# Patient Record
Sex: Female | Born: 1935 | Race: White | Hispanic: No | Marital: Married | Smoking: Never smoker
Health system: Southern US, Community
[De-identification: ages and names within clinical notes are randomized; demographics above are authoritative.]

## PROBLEM LIST (undated history)

## (undated) DIAGNOSIS — K2210 Ulcer of esophagus without bleeding: Secondary | ICD-10-CM

## (undated) DIAGNOSIS — M1990 Unspecified osteoarthritis, unspecified site: Secondary | ICD-10-CM

## (undated) DIAGNOSIS — I779 Disorder of arteries and arterioles, unspecified: Secondary | ICD-10-CM

## (undated) DIAGNOSIS — I340 Nonrheumatic mitral (valve) insufficiency: Secondary | ICD-10-CM

## (undated) DIAGNOSIS — T148XXA Other injury of unspecified body region, initial encounter: Secondary | ICD-10-CM

## (undated) DIAGNOSIS — I10 Essential (primary) hypertension: Secondary | ICD-10-CM

## (undated) DIAGNOSIS — E785 Hyperlipidemia, unspecified: Secondary | ICD-10-CM

## (undated) DIAGNOSIS — G4730 Sleep apnea, unspecified: Secondary | ICD-10-CM

## (undated) DIAGNOSIS — H547 Unspecified visual loss: Secondary | ICD-10-CM

## (undated) DIAGNOSIS — I358 Other nonrheumatic aortic valve disorders: Secondary | ICD-10-CM

## (undated) DIAGNOSIS — K589 Irritable bowel syndrome without diarrhea: Secondary | ICD-10-CM

## (undated) DIAGNOSIS — R110 Nausea: Secondary | ICD-10-CM

## (undated) DIAGNOSIS — F418 Other specified anxiety disorders: Secondary | ICD-10-CM

## (undated) DIAGNOSIS — IMO0002 Reserved for concepts with insufficient information to code with codable children: Secondary | ICD-10-CM

## (undated) DIAGNOSIS — C4491 Basal cell carcinoma of skin, unspecified: Secondary | ICD-10-CM

## (undated) DIAGNOSIS — I4892 Unspecified atrial flutter: Secondary | ICD-10-CM

## (undated) DIAGNOSIS — K219 Gastro-esophageal reflux disease without esophagitis: Secondary | ICD-10-CM

## (undated) DIAGNOSIS — N3000 Acute cystitis without hematuria: Secondary | ICD-10-CM

## (undated) DIAGNOSIS — K5730 Diverticulosis of large intestine without perforation or abscess without bleeding: Secondary | ICD-10-CM

## (undated) DIAGNOSIS — IMO0001 Reserved for inherently not codable concepts without codable children: Secondary | ICD-10-CM

## (undated) DIAGNOSIS — R9430 Abnormal result of cardiovascular function study, unspecified: Secondary | ICD-10-CM

## (undated) DIAGNOSIS — E7800 Pure hypercholesterolemia, unspecified: Secondary | ICD-10-CM

## (undated) DIAGNOSIS — D71 Functional disorders of polymorphonuclear neutrophils: Secondary | ICD-10-CM

## (undated) DIAGNOSIS — R5383 Other fatigue: Secondary | ICD-10-CM

## (undated) DIAGNOSIS — R609 Edema, unspecified: Secondary | ICD-10-CM

## (undated) DIAGNOSIS — Z7901 Long term (current) use of anticoagulants: Secondary | ICD-10-CM

## (undated) DIAGNOSIS — R140 Abdominal distension (gaseous): Secondary | ICD-10-CM

## (undated) DIAGNOSIS — E669 Obesity, unspecified: Secondary | ICD-10-CM

## (undated) DIAGNOSIS — I739 Peripheral vascular disease, unspecified: Secondary | ICD-10-CM

## (undated) DIAGNOSIS — K921 Melena: Secondary | ICD-10-CM

## (undated) DIAGNOSIS — M549 Dorsalgia, unspecified: Secondary | ICD-10-CM

## (undated) HISTORY — PX: DILATION AND CURETTAGE OF UTERUS: SHX78

## (undated) HISTORY — PX: OTHER SURGICAL HISTORY: SHX169

## (undated) HISTORY — DX: Other specified anxiety disorders: F41.8

## (undated) HISTORY — DX: Pure hypercholesterolemia, unspecified: E78.00

## (undated) HISTORY — DX: Reserved for inherently not codable concepts without codable children: IMO0001

## (undated) HISTORY — DX: Melena: K92.1

## (undated) HISTORY — DX: Unspecified atrial flutter: I48.92

## (undated) HISTORY — DX: Other injury of unspecified body region, initial encounter: T14.8XXA

## (undated) HISTORY — DX: Hyperlipidemia, unspecified: E78.5

## (undated) HISTORY — DX: Basal cell carcinoma of skin, unspecified: C44.91

## (undated) HISTORY — DX: Nonrheumatic mitral (valve) insufficiency: I34.0

## (undated) HISTORY — DX: Diverticulosis of large intestine without perforation or abscess without bleeding: K57.30

## (undated) HISTORY — DX: Essential (primary) hypertension: I10

## (undated) HISTORY — DX: Irritable bowel syndrome, unspecified: K58.9

## (undated) HISTORY — DX: Functional disorders of polymorphonuclear neutrophils: D71

## (undated) HISTORY — DX: Other nonrheumatic aortic valve disorders: I35.8

## (undated) HISTORY — DX: Unspecified osteoarthritis, unspecified site: M19.90

## (undated) HISTORY — PX: REPLACEMENT TOTAL KNEE BILATERAL: SUR1225

## (undated) HISTORY — DX: Nausea: R11.0

## (undated) HISTORY — DX: Abdominal distension (gaseous): R14.0

## (undated) HISTORY — DX: Sleep apnea, unspecified: G47.30

## (undated) HISTORY — DX: Edema, unspecified: R60.9

## (undated) HISTORY — DX: Unspecified visual loss: H54.7

## (undated) HISTORY — DX: Other fatigue: R53.83

## (undated) HISTORY — DX: Gastro-esophageal reflux disease without esophagitis: K21.9

## (undated) HISTORY — PX: CATARACT EXTRACTION: SUR2

## (undated) HISTORY — DX: Disorder of arteries and arterioles, unspecified: I77.9

## (undated) HISTORY — DX: Abnormal result of cardiovascular function study, unspecified: R94.30

## (undated) HISTORY — DX: Acute cystitis without hematuria: N30.00

## (undated) HISTORY — DX: Peripheral vascular disease, unspecified: I73.9

## (undated) HISTORY — DX: Dorsalgia, unspecified: M54.9

## (undated) HISTORY — DX: Long term (current) use of anticoagulants: Z79.01

## (undated) HISTORY — DX: Obesity, unspecified: E66.9

## (undated) HISTORY — DX: Ulcer of esophagus without bleeding: K22.10

## (undated) HISTORY — DX: Reserved for concepts with insufficient information to code with codable children: IMO0002

---
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
## 1999-01-02 HISTORY — PX: ABDOMINAL HYSTERECTOMY: SHX81

## 1999-01-02 HISTORY — PX: CATARACT EXTRACTION: SUR2

## 2001-11-24 ENCOUNTER — Ambulatory Visit (HOSPITAL_COMMUNITY): Admission: RE | Admit: 2001-11-24 | Discharge: 2001-11-24 | Payer: Self-pay | Admitting: Internal Medicine

## 2001-11-24 ENCOUNTER — Encounter: Payer: Self-pay | Admitting: Internal Medicine

## 2002-06-02 ENCOUNTER — Encounter: Payer: Self-pay | Admitting: Emergency Medicine

## 2002-06-02 ENCOUNTER — Encounter: Payer: Self-pay | Admitting: Internal Medicine

## 2002-06-02 ENCOUNTER — Observation Stay (HOSPITAL_COMMUNITY): Admission: EM | Admit: 2002-06-02 | Discharge: 2002-06-04 | Payer: Self-pay | Admitting: Emergency Medicine

## 2002-11-27 ENCOUNTER — Ambulatory Visit (HOSPITAL_COMMUNITY): Admission: RE | Admit: 2002-11-27 | Discharge: 2002-11-27 | Payer: Self-pay | Admitting: Family Medicine

## 2002-12-23 ENCOUNTER — Encounter: Admission: RE | Admit: 2002-12-23 | Discharge: 2002-12-23 | Payer: Self-pay | Admitting: Sports Medicine

## 2003-01-06 ENCOUNTER — Encounter: Admission: RE | Admit: 2003-01-06 | Discharge: 2003-01-06 | Payer: Self-pay | Admitting: Sports Medicine

## 2003-01-21 ENCOUNTER — Encounter: Admission: RE | Admit: 2003-01-21 | Discharge: 2003-01-21 | Payer: Self-pay | Admitting: Sports Medicine

## 2003-04-28 ENCOUNTER — Ambulatory Visit (HOSPITAL_COMMUNITY): Admission: RE | Admit: 2003-04-28 | Discharge: 2003-04-28 | Payer: Self-pay | Admitting: Family Medicine

## 2003-06-21 ENCOUNTER — Encounter (HOSPITAL_BASED_OUTPATIENT_CLINIC_OR_DEPARTMENT_OTHER): Admission: RE | Admit: 2003-06-21 | Discharge: 2003-07-02 | Payer: Self-pay | Admitting: Internal Medicine

## 2003-07-13 ENCOUNTER — Emergency Department (HOSPITAL_COMMUNITY): Admission: EM | Admit: 2003-07-13 | Discharge: 2003-07-13 | Payer: Self-pay | Admitting: Emergency Medicine

## 2003-09-20 ENCOUNTER — Ambulatory Visit (HOSPITAL_COMMUNITY): Admission: RE | Admit: 2003-09-20 | Discharge: 2003-09-20 | Payer: Self-pay | Admitting: Ophthalmology

## 2003-12-09 ENCOUNTER — Ambulatory Visit: Payer: Self-pay | Admitting: Family Medicine

## 2003-12-14 ENCOUNTER — Ambulatory Visit (HOSPITAL_COMMUNITY): Admission: RE | Admit: 2003-12-14 | Discharge: 2003-12-14 | Payer: Self-pay | Admitting: Family Medicine

## 2004-01-26 ENCOUNTER — Ambulatory Visit: Payer: Self-pay | Admitting: Family Medicine

## 2004-02-14 ENCOUNTER — Ambulatory Visit: Payer: Self-pay | Admitting: Cardiology

## 2004-02-22 ENCOUNTER — Ambulatory Visit (HOSPITAL_COMMUNITY): Admission: RE | Admit: 2004-02-22 | Discharge: 2004-02-22 | Payer: Self-pay | Admitting: Cardiology

## 2004-02-22 ENCOUNTER — Ambulatory Visit: Payer: Self-pay | Admitting: *Deleted

## 2004-02-29 ENCOUNTER — Ambulatory Visit: Payer: Self-pay | Admitting: Family Medicine

## 2004-03-08 ENCOUNTER — Ambulatory Visit: Payer: Self-pay | Admitting: Cardiology

## 2004-03-17 ENCOUNTER — Ambulatory Visit: Payer: Self-pay | Admitting: Family Medicine

## 2004-04-11 ENCOUNTER — Ambulatory Visit: Payer: Self-pay | Admitting: Family Medicine

## 2004-04-25 ENCOUNTER — Inpatient Hospital Stay (HOSPITAL_COMMUNITY): Admission: EM | Admit: 2004-04-25 | Discharge: 2004-04-29 | Payer: Self-pay | Admitting: *Deleted

## 2004-05-10 ENCOUNTER — Ambulatory Visit: Payer: Self-pay | Admitting: Family Medicine

## 2004-05-28 ENCOUNTER — Emergency Department (HOSPITAL_COMMUNITY): Admission: EM | Admit: 2004-05-28 | Discharge: 2004-05-28 | Payer: Self-pay | Admitting: Emergency Medicine

## 2004-06-29 ENCOUNTER — Ambulatory Visit: Payer: Self-pay | Admitting: Family Medicine

## 2004-08-14 ENCOUNTER — Ambulatory Visit: Payer: Self-pay | Admitting: Family Medicine

## 2004-09-14 ENCOUNTER — Other Ambulatory Visit: Admission: RE | Admit: 2004-09-14 | Discharge: 2004-09-14 | Payer: Self-pay | Admitting: Dermatology

## 2004-09-21 ENCOUNTER — Ambulatory Visit: Payer: Self-pay | Admitting: Family Medicine

## 2004-10-09 ENCOUNTER — Ambulatory Visit: Payer: Self-pay | Admitting: Family Medicine

## 2004-11-29 ENCOUNTER — Ambulatory Visit: Payer: Self-pay | Admitting: Family Medicine

## 2004-12-15 ENCOUNTER — Ambulatory Visit (HOSPITAL_COMMUNITY): Admission: RE | Admit: 2004-12-15 | Discharge: 2004-12-15 | Payer: Self-pay | Admitting: Family Medicine

## 2005-01-10 ENCOUNTER — Ambulatory Visit: Payer: Self-pay | Admitting: Family Medicine

## 2005-02-21 ENCOUNTER — Ambulatory Visit: Payer: Self-pay | Admitting: Family Medicine

## 2005-04-11 ENCOUNTER — Ambulatory Visit: Payer: Self-pay | Admitting: Family Medicine

## 2005-07-12 ENCOUNTER — Ambulatory Visit: Payer: Self-pay | Admitting: Family Medicine

## 2005-07-18 ENCOUNTER — Ambulatory Visit (HOSPITAL_COMMUNITY): Admission: RE | Admit: 2005-07-18 | Discharge: 2005-07-18 | Payer: Self-pay | Admitting: Family Medicine

## 2005-08-09 ENCOUNTER — Ambulatory Visit: Payer: Self-pay | Admitting: Family Medicine

## 2005-09-20 ENCOUNTER — Ambulatory Visit: Payer: Self-pay | Admitting: Cardiology

## 2005-09-24 ENCOUNTER — Ambulatory Visit: Payer: Self-pay | Admitting: Cardiology

## 2005-09-25 ENCOUNTER — Ambulatory Visit: Payer: Self-pay | Admitting: Cardiology

## 2005-10-04 ENCOUNTER — Ambulatory Visit: Payer: Self-pay | Admitting: Cardiology

## 2005-10-25 ENCOUNTER — Ambulatory Visit: Payer: Self-pay | Admitting: Family Medicine

## 2005-11-12 ENCOUNTER — Ambulatory Visit: Payer: Self-pay | Admitting: Cardiology

## 2005-11-15 ENCOUNTER — Ambulatory Visit: Payer: Self-pay | Admitting: Cardiology

## 2005-11-19 ENCOUNTER — Ambulatory Visit: Payer: Self-pay | Admitting: Family Medicine

## 2005-11-21 ENCOUNTER — Ambulatory Visit: Payer: Self-pay | Admitting: Cardiology

## 2005-11-23 ENCOUNTER — Ambulatory Visit: Payer: Self-pay | Admitting: Cardiology

## 2005-11-30 ENCOUNTER — Ambulatory Visit: Payer: Self-pay | Admitting: Cardiology

## 2005-12-03 ENCOUNTER — Ambulatory Visit: Payer: Self-pay | Admitting: Cardiology

## 2005-12-07 ENCOUNTER — Ambulatory Visit: Payer: Self-pay | Admitting: Cardiology

## 2005-12-13 ENCOUNTER — Ambulatory Visit: Payer: Self-pay | Admitting: Cardiology

## 2005-12-17 ENCOUNTER — Ambulatory Visit (HOSPITAL_COMMUNITY): Admission: RE | Admit: 2005-12-17 | Discharge: 2005-12-17 | Payer: Self-pay | Admitting: Family Medicine

## 2005-12-20 ENCOUNTER — Ambulatory Visit: Payer: Self-pay | Admitting: Cardiology

## 2005-12-28 ENCOUNTER — Ambulatory Visit: Payer: Self-pay | Admitting: Cardiology

## 2006-01-04 ENCOUNTER — Ambulatory Visit: Payer: Self-pay | Admitting: Cardiology

## 2006-01-10 ENCOUNTER — Ambulatory Visit: Payer: Self-pay | Admitting: Cardiology

## 2006-01-18 ENCOUNTER — Ambulatory Visit: Payer: Self-pay | Admitting: Cardiology

## 2006-01-22 ENCOUNTER — Ambulatory Visit: Payer: Self-pay | Admitting: Cardiology

## 2006-01-29 ENCOUNTER — Ambulatory Visit: Payer: Self-pay | Admitting: Cardiology

## 2006-02-01 DIAGNOSIS — I4892 Unspecified atrial flutter: Secondary | ICD-10-CM

## 2006-02-01 HISTORY — DX: Unspecified atrial flutter: I48.92

## 2006-02-07 ENCOUNTER — Ambulatory Visit: Payer: Self-pay | Admitting: Cardiology

## 2006-02-12 ENCOUNTER — Ambulatory Visit: Payer: Self-pay | Admitting: Cardiology

## 2006-02-13 ENCOUNTER — Ambulatory Visit: Payer: Self-pay | Admitting: Cardiology

## 2006-02-20 ENCOUNTER — Ambulatory Visit: Payer: Self-pay | Admitting: Cardiology

## 2006-02-27 ENCOUNTER — Encounter: Payer: Self-pay | Admitting: Family Medicine

## 2006-02-27 LAB — CONVERTED CEMR LAB
BUN: 27 mg/dL — ABNORMAL HIGH (ref 6–23)
CO2: 26 meq/L (ref 19–32)
Calcium: 9.9 mg/dL (ref 8.4–10.5)
Chloride: 101 meq/L (ref 96–112)
Creatinine, Ser: 0.93 mg/dL (ref 0.40–1.20)
Hgb A1c MFr Bld: 6.9 % — ABNORMAL HIGH (ref 4.6–6.1)
Potassium: 5.1 meq/L (ref 3.5–5.3)
Sodium: 144 meq/L (ref 135–145)

## 2006-02-28 ENCOUNTER — Ambulatory Visit: Payer: Self-pay | Admitting: Family Medicine

## 2006-02-28 ENCOUNTER — Other Ambulatory Visit: Admission: RE | Admit: 2006-02-28 | Discharge: 2006-02-28 | Payer: Self-pay | Admitting: Family Medicine

## 2006-02-28 ENCOUNTER — Encounter: Payer: Self-pay | Admitting: Family Medicine

## 2006-02-28 ENCOUNTER — Encounter (INDEPENDENT_AMBULATORY_CARE_PROVIDER_SITE_OTHER): Payer: Self-pay | Admitting: *Deleted

## 2006-03-04 ENCOUNTER — Ambulatory Visit (HOSPITAL_COMMUNITY): Admission: RE | Admit: 2006-03-04 | Discharge: 2006-03-04 | Payer: Self-pay | Admitting: Family Medicine

## 2006-03-06 ENCOUNTER — Ambulatory Visit: Payer: Self-pay | Admitting: Cardiology

## 2006-03-13 ENCOUNTER — Ambulatory Visit: Payer: Self-pay | Admitting: Cardiology

## 2006-03-25 ENCOUNTER — Ambulatory Visit: Payer: Self-pay | Admitting: Family Medicine

## 2006-03-27 ENCOUNTER — Ambulatory Visit: Payer: Self-pay | Admitting: Cardiology

## 2006-04-18 ENCOUNTER — Emergency Department (HOSPITAL_COMMUNITY): Admission: EM | Admit: 2006-04-18 | Discharge: 2006-04-18 | Payer: Self-pay | Admitting: Emergency Medicine

## 2006-04-24 ENCOUNTER — Ambulatory Visit: Payer: Self-pay | Admitting: Cardiology

## 2006-05-09 ENCOUNTER — Ambulatory Visit: Payer: Self-pay | Admitting: Cardiology

## 2006-05-24 ENCOUNTER — Encounter: Payer: Self-pay | Admitting: Family Medicine

## 2006-05-24 LAB — CONVERTED CEMR LAB
AST: 47 units/L — ABNORMAL HIGH (ref 0–37)
Alkaline Phosphatase: 35 units/L — ABNORMAL LOW (ref 39–117)
BUN: 27 mg/dL — ABNORMAL HIGH (ref 6–23)
Bilirubin, Direct: 0.1 mg/dL (ref 0.0–0.3)
Calcium: 9.6 mg/dL (ref 8.4–10.5)
Cholesterol: 189 mg/dL (ref 0–200)
Glucose, Bld: 128 mg/dL — ABNORMAL HIGH (ref 70–99)
HDL: 59 mg/dL (ref >39)
LDL Cholesterol: 68 mg/dL (ref 0–99)
Potassium: 4.5 meq/L (ref 3.5–5.3)
Sodium: 142 meq/L (ref 135–145)
Total Bilirubin: 0.6 mg/dL (ref 0.3–1.2)
Total CHOL/HDL Ratio: 3.2
Total Protein: 7.5 g/dL (ref 6.0–8.3)
Triglycerides: 311 mg/dL — ABNORMAL HIGH (ref <150)

## 2006-05-30 ENCOUNTER — Ambulatory Visit: Payer: Self-pay | Admitting: Family Medicine

## 2006-05-31 ENCOUNTER — Ambulatory Visit: Payer: Self-pay | Admitting: Cardiology

## 2006-05-31 ENCOUNTER — Encounter: Payer: Self-pay | Admitting: Family Medicine

## 2006-06-03 ENCOUNTER — Ambulatory Visit (HOSPITAL_COMMUNITY): Admission: RE | Admit: 2006-06-03 | Discharge: 2006-06-03 | Payer: Self-pay | Admitting: Family Medicine

## 2006-06-13 ENCOUNTER — Ambulatory Visit: Payer: Self-pay | Admitting: Cardiology

## 2006-06-20 ENCOUNTER — Ambulatory Visit: Payer: Self-pay | Admitting: Physician Assistant

## 2006-06-27 ENCOUNTER — Encounter: Payer: Self-pay | Admitting: Family Medicine

## 2006-06-27 ENCOUNTER — Ambulatory Visit: Payer: Self-pay | Admitting: Physician Assistant

## 2006-06-27 LAB — CONVERTED CEMR LAB
AST: 20 units/L (ref 0–37)
Bilirubin, Direct: <0.1 mg/dL (ref 0.0–0.3)
Total Bilirubin: 0.6 mg/dL (ref 0.3–1.2)

## 2006-07-04 ENCOUNTER — Ambulatory Visit: Payer: Self-pay | Admitting: Cardiology

## 2006-07-08 ENCOUNTER — Ambulatory Visit: Payer: Self-pay | Admitting: Internal Medicine

## 2006-07-08 ENCOUNTER — Ambulatory Visit: Payer: Self-pay | Admitting: Family Medicine

## 2006-07-15 ENCOUNTER — Ambulatory Visit: Payer: Self-pay | Admitting: Family Medicine

## 2006-07-15 ENCOUNTER — Ambulatory Visit (HOSPITAL_COMMUNITY): Admission: RE | Admit: 2006-07-15 | Discharge: 2006-07-15 | Payer: Self-pay | Admitting: Family Medicine

## 2006-07-15 LAB — CONVERTED CEMR LAB
Basophils Relative: 1 % (ref 0–1)
Eosinophils Relative: 6 % — ABNORMAL HIGH (ref 0–5)
HCT: 38.5 % (ref 36.0–46.0)
Hemoglobin: 13 g/dL (ref 12.0–15.0)
INR: 1.2 (ref 0.0–1.5)
Lymphocytes Relative: 29 % (ref 12–46)
Neutro Abs: 5.7 10*3/uL (ref 1.7–7.7)
Neutrophils Relative %: 56 % (ref 43–77)
Prothrombin Time: 15.9 s — ABNORMAL HIGH (ref 11.6–15.2)
WBC: 10.3 10*3/uL (ref 4.0–10.5)

## 2006-07-17 ENCOUNTER — Encounter (INDEPENDENT_AMBULATORY_CARE_PROVIDER_SITE_OTHER): Payer: Self-pay | Admitting: *Deleted

## 2006-07-17 ENCOUNTER — Ambulatory Visit: Payer: Self-pay | Admitting: Internal Medicine

## 2006-07-17 ENCOUNTER — Ambulatory Visit (HOSPITAL_COMMUNITY): Admission: RE | Admit: 2006-07-17 | Discharge: 2006-07-17 | Payer: Self-pay | Admitting: Internal Medicine

## 2006-07-17 ENCOUNTER — Encounter: Payer: Self-pay | Admitting: Internal Medicine

## 2006-07-17 HISTORY — PX: ESOPHAGOGASTRODUODENOSCOPY: SHX1529

## 2006-07-17 HISTORY — PX: COLONOSCOPY: SHX174

## 2006-07-17 LAB — HM COLONOSCOPY: HM Colonoscopy: ABNORMAL

## 2006-07-22 ENCOUNTER — Ambulatory Visit (HOSPITAL_COMMUNITY): Admission: RE | Admit: 2006-07-22 | Discharge: 2006-07-22 | Payer: Self-pay | Admitting: Family Medicine

## 2006-07-23 ENCOUNTER — Ambulatory Visit: Payer: Self-pay | Admitting: Cardiology

## 2006-07-29 ENCOUNTER — Ambulatory Visit: Payer: Self-pay | Admitting: Cardiology

## 2006-07-31 ENCOUNTER — Encounter: Payer: Self-pay | Admitting: Family Medicine

## 2006-07-31 LAB — CONVERTED CEMR LAB
ALT: 14 units/L (ref 0–35)
AST: 20 units/L (ref 0–37)
Albumin: 4.5 g/dL (ref 3.5–5.2)
Alkaline Phosphatase: 45 units/L (ref 39–117)
Bilirubin, Direct: 0.1 mg/dL (ref 0.0–0.3)
Indirect Bilirubin: 0.3 mg/dL (ref 0.0–0.9)
Total Bilirubin: 0.4 mg/dL (ref 0.3–1.2)
Total Protein: 7.1 g/dL (ref 6.0–8.3)

## 2006-08-19 ENCOUNTER — Ambulatory Visit: Payer: Self-pay | Admitting: Cardiology

## 2006-08-19 ENCOUNTER — Encounter: Admission: RE | Admit: 2006-08-19 | Discharge: 2006-08-19 | Payer: Self-pay | Admitting: Sports Medicine

## 2006-08-23 ENCOUNTER — Ambulatory Visit: Payer: Self-pay | Admitting: Cardiology

## 2006-09-09 ENCOUNTER — Ambulatory Visit: Payer: Self-pay | Admitting: Family Medicine

## 2006-09-13 ENCOUNTER — Ambulatory Visit: Payer: Self-pay | Admitting: Cardiology

## 2006-09-26 ENCOUNTER — Ambulatory Visit: Payer: Self-pay | Admitting: Cardiology

## 2006-10-01 ENCOUNTER — Encounter: Payer: Self-pay | Admitting: Family Medicine

## 2006-10-01 LAB — CONVERTED CEMR LAB
AST: 19 units/L (ref 0–37)
Albumin: 4.6 g/dL (ref 3.5–5.2)
Alkaline Phosphatase: 39 units/L (ref 39–117)
Chloride: 103 meq/L (ref 96–112)
Glucose, Bld: 177 mg/dL — ABNORMAL HIGH (ref 70–99)
Potassium: 4.6 meq/L (ref 3.5–5.3)
Sodium: 140 meq/L (ref 135–145)

## 2006-10-10 ENCOUNTER — Ambulatory Visit: Payer: Self-pay | Admitting: Cardiology

## 2006-10-15 ENCOUNTER — Ambulatory Visit: Payer: Self-pay | Admitting: Internal Medicine

## 2006-10-24 ENCOUNTER — Ambulatory Visit: Payer: Self-pay | Admitting: Cardiology

## 2006-11-14 ENCOUNTER — Ambulatory Visit: Payer: Self-pay | Admitting: Cardiology

## 2006-12-09 ENCOUNTER — Ambulatory Visit: Payer: Self-pay | Admitting: Family Medicine

## 2006-12-09 LAB — CONVERTED CEMR LAB
Calcium: 10.3 mg/dL (ref 8.4–10.5)
Creatinine, Ser: 0.83 mg/dL (ref 0.40–1.20)
Glucose, Bld: 85 mg/dL (ref 70–99)

## 2006-12-12 ENCOUNTER — Ambulatory Visit: Payer: Self-pay | Admitting: Cardiology

## 2006-12-19 ENCOUNTER — Encounter (INDEPENDENT_AMBULATORY_CARE_PROVIDER_SITE_OTHER): Payer: Self-pay | Admitting: *Deleted

## 2006-12-20 ENCOUNTER — Ambulatory Visit (HOSPITAL_COMMUNITY): Admission: RE | Admit: 2006-12-20 | Discharge: 2006-12-20 | Payer: Self-pay | Admitting: Family Medicine

## 2006-12-24 ENCOUNTER — Ambulatory Visit: Payer: Self-pay | Admitting: Cardiology

## 2006-12-24 ENCOUNTER — Encounter: Admission: RE | Admit: 2006-12-24 | Discharge: 2006-12-24 | Payer: Self-pay | Admitting: Sports Medicine

## 2007-01-01 ENCOUNTER — Ambulatory Visit: Payer: Self-pay | Admitting: Cardiology

## 2007-01-08 ENCOUNTER — Ambulatory Visit: Payer: Self-pay | Admitting: Cardiology

## 2007-01-08 ENCOUNTER — Encounter: Admission: RE | Admit: 2007-01-08 | Discharge: 2007-01-08 | Payer: Self-pay | Admitting: Sports Medicine

## 2007-01-13 ENCOUNTER — Ambulatory Visit: Payer: Self-pay | Admitting: Cardiology

## 2007-01-23 ENCOUNTER — Encounter: Payer: Self-pay | Admitting: Family Medicine

## 2007-01-23 LAB — CONVERTED CEMR LAB
Cholesterol: 162 mg/dL (ref 0–200)
HDL: 79 mg/dL (ref >39)
LDL Cholesterol: 48 mg/dL (ref 0–99)
Triglycerides: 176 mg/dL — ABNORMAL HIGH (ref <150)

## 2007-01-30 ENCOUNTER — Ambulatory Visit: Payer: Self-pay | Admitting: Family Medicine

## 2007-02-03 ENCOUNTER — Ambulatory Visit: Payer: Self-pay | Admitting: Cardiology

## 2007-02-03 ENCOUNTER — Encounter: Admission: RE | Admit: 2007-02-03 | Discharge: 2007-02-03 | Payer: Self-pay | Admitting: Sports Medicine

## 2007-02-04 ENCOUNTER — Encounter: Payer: Self-pay | Admitting: Family Medicine

## 2007-02-10 ENCOUNTER — Ambulatory Visit: Payer: Self-pay | Admitting: Cardiology

## 2007-03-11 ENCOUNTER — Ambulatory Visit: Payer: Self-pay | Admitting: Cardiology

## 2007-03-11 DIAGNOSIS — F329 Major depressive disorder, single episode, unspecified: Secondary | ICD-10-CM

## 2007-03-11 DIAGNOSIS — E1121 Type 2 diabetes mellitus with diabetic nephropathy: Secondary | ICD-10-CM | POA: Insufficient documentation

## 2007-03-11 DIAGNOSIS — F411 Generalized anxiety disorder: Secondary | ICD-10-CM | POA: Insufficient documentation

## 2007-03-11 DIAGNOSIS — F419 Anxiety disorder, unspecified: Secondary | ICD-10-CM | POA: Insufficient documentation

## 2007-03-11 DIAGNOSIS — I10 Essential (primary) hypertension: Secondary | ICD-10-CM | POA: Insufficient documentation

## 2007-03-11 DIAGNOSIS — F32A Depression, unspecified: Secondary | ICD-10-CM | POA: Insufficient documentation

## 2007-03-11 DIAGNOSIS — M1990 Unspecified osteoarthritis, unspecified site: Secondary | ICD-10-CM | POA: Insufficient documentation

## 2007-04-08 ENCOUNTER — Ambulatory Visit: Payer: Self-pay | Admitting: Cardiology

## 2007-04-24 ENCOUNTER — Ambulatory Visit: Payer: Self-pay | Admitting: Internal Medicine

## 2007-04-30 ENCOUNTER — Ambulatory Visit: Payer: Self-pay | Admitting: Family Medicine

## 2007-05-07 ENCOUNTER — Ambulatory Visit: Payer: Self-pay | Admitting: Cardiology

## 2007-05-20 ENCOUNTER — Ambulatory Visit: Payer: Self-pay | Admitting: Cardiology

## 2007-06-19 ENCOUNTER — Ambulatory Visit: Payer: Self-pay | Admitting: Cardiology

## 2007-08-07 ENCOUNTER — Ambulatory Visit: Payer: Self-pay | Admitting: Cardiology

## 2007-08-07 ENCOUNTER — Encounter: Payer: Self-pay | Admitting: Family Medicine

## 2007-08-21 ENCOUNTER — Encounter: Payer: Self-pay | Admitting: Family Medicine

## 2007-08-21 LAB — CONVERTED CEMR LAB
ALT: 14 units/L (ref 0–35)
Albumin: 4.8 g/dL (ref 3.5–5.2)
Basophils Absolute: 0 10*3/uL (ref 0.0–0.1)
Basophils Relative: 0 % (ref 0–1)
Calcium: 9.3 mg/dL (ref 8.4–10.5)
Chloride: 103 meq/L (ref 96–112)
Cholesterol: 175 mg/dL (ref 0–200)
Creatinine, Ser: 0.67 mg/dL (ref 0.40–1.20)
HCT: 39.4 % (ref 36.0–46.0)
Hemoglobin: 12.3 g/dL (ref 12.0–15.0)
Lymphs Abs: 2.4 10*3/uL (ref 0.7–4.0)
Monocytes Absolute: 0.6 10*3/uL (ref 0.1–1.0)
Monocytes Relative: 7 % (ref 3–12)
Neutro Abs: 5 10*3/uL (ref 1.7–7.7)
Neutrophils Relative %: 58 % (ref 43–77)
Sodium: 142 meq/L (ref 135–145)
VLDL: 38 mg/dL (ref 0–40)

## 2007-09-03 ENCOUNTER — Ambulatory Visit: Payer: Self-pay | Admitting: Family Medicine

## 2007-09-17 ENCOUNTER — Ambulatory Visit: Payer: Self-pay | Admitting: Cardiology

## 2007-10-06 ENCOUNTER — Telehealth: Payer: Self-pay | Admitting: Family Medicine

## 2007-10-14 ENCOUNTER — Telehealth: Payer: Self-pay | Admitting: Family Medicine

## 2007-10-14 ENCOUNTER — Encounter: Payer: Self-pay | Admitting: Family Medicine

## 2007-10-17 ENCOUNTER — Ambulatory Visit: Payer: Self-pay | Admitting: Cardiology

## 2007-11-04 ENCOUNTER — Ambulatory Visit: Payer: Self-pay | Admitting: Family Medicine

## 2007-11-04 DIAGNOSIS — M5440 Lumbago with sciatica, unspecified side: Secondary | ICD-10-CM | POA: Insufficient documentation

## 2007-11-05 ENCOUNTER — Encounter: Payer: Self-pay | Admitting: Family Medicine

## 2007-11-05 LAB — CONVERTED CEMR LAB
Creatinine, Urine: 178.1 mg/dL
Microalb Creat Ratio: 10.5 mg/g (ref 0.0–30.0)
Microalb, Ur: 1.87 mg/dL (ref 0.00–1.89)

## 2007-11-14 ENCOUNTER — Ambulatory Visit: Payer: Self-pay | Admitting: Cardiology

## 2007-11-18 ENCOUNTER — Encounter: Payer: Self-pay | Admitting: Cardiology

## 2007-11-19 ENCOUNTER — Ambulatory Visit: Payer: Self-pay | Admitting: Cardiology

## 2007-11-20 ENCOUNTER — Encounter: Payer: Self-pay | Admitting: Cardiology

## 2007-11-26 ENCOUNTER — Telehealth: Payer: Self-pay | Admitting: Family Medicine

## 2007-12-01 ENCOUNTER — Encounter: Payer: Self-pay | Admitting: Family Medicine

## 2007-12-08 ENCOUNTER — Ambulatory Visit: Payer: Self-pay | Admitting: Family Medicine

## 2007-12-08 DIAGNOSIS — N3000 Acute cystitis without hematuria: Secondary | ICD-10-CM | POA: Insufficient documentation

## 2007-12-08 DIAGNOSIS — T148XXA Other injury of unspecified body region, initial encounter: Secondary | ICD-10-CM | POA: Insufficient documentation

## 2007-12-08 DIAGNOSIS — R5383 Other fatigue: Secondary | ICD-10-CM

## 2007-12-08 DIAGNOSIS — R5381 Other malaise: Secondary | ICD-10-CM | POA: Insufficient documentation

## 2007-12-09 ENCOUNTER — Encounter: Payer: Self-pay | Admitting: Family Medicine

## 2007-12-09 LAB — CONVERTED CEMR LAB
INR: 1 (ref 0.0–1.5)
MCHC: 33.8 g/dL (ref 30.0–36.0)
MCV: 96.8 fL (ref 78.0–100.0)
Prothrombin Time: 13.1 s (ref 11.6–15.2)
RBC: 3.75 M/uL — ABNORMAL LOW (ref 3.87–5.11)
RDW: 19.1 % — ABNORMAL HIGH (ref 11.5–15.5)

## 2007-12-23 ENCOUNTER — Ambulatory Visit (HOSPITAL_COMMUNITY): Admission: RE | Admit: 2007-12-23 | Discharge: 2007-12-23 | Payer: Self-pay | Admitting: Family Medicine

## 2008-01-02 DIAGNOSIS — I340 Nonrheumatic mitral (valve) insufficiency: Secondary | ICD-10-CM

## 2008-01-02 HISTORY — DX: Nonrheumatic mitral (valve) insufficiency: I34.0

## 2008-01-09 ENCOUNTER — Ambulatory Visit: Payer: Self-pay | Admitting: Cardiology

## 2008-02-20 ENCOUNTER — Encounter: Payer: Self-pay | Admitting: Family Medicine

## 2008-02-23 LAB — CONVERTED CEMR LAB
ALT: 13 units/L (ref 0–35)
Albumin: 4.6 g/dL (ref 3.5–5.2)
CO2: 25 meq/L (ref 19–32)
Calcium: 9.8 mg/dL (ref 8.4–10.5)
Creatinine, Ser: 0.7 mg/dL (ref 0.40–1.20)
Hgb A1c MFr Bld: 7.6 % — ABNORMAL HIGH (ref 4.6–6.1)
Indirect Bilirubin: 0.3 mg/dL (ref 0.0–0.9)
LDL Cholesterol: 61 mg/dL (ref 0–99)
Potassium: 5.5 meq/L — ABNORMAL HIGH (ref 3.5–5.3)
Sodium: 140 meq/L (ref 135–145)
Total CHOL/HDL Ratio: 2.4
Total Protein: 7.4 g/dL (ref 6.0–8.3)

## 2008-03-09 ENCOUNTER — Encounter: Payer: Self-pay | Admitting: Family Medicine

## 2008-03-10 ENCOUNTER — Ambulatory Visit: Payer: Self-pay | Admitting: Family Medicine

## 2008-03-10 DIAGNOSIS — G4730 Sleep apnea, unspecified: Secondary | ICD-10-CM | POA: Insufficient documentation

## 2008-03-10 LAB — CONVERTED CEMR LAB: Glucose, Bld: 165 mg/dL

## 2008-03-23 ENCOUNTER — Encounter (HOSPITAL_COMMUNITY): Admission: RE | Admit: 2008-03-23 | Discharge: 2008-03-23 | Payer: Self-pay | Admitting: Family Medicine

## 2008-03-24 ENCOUNTER — Encounter: Admission: RE | Admit: 2008-03-24 | Discharge: 2008-05-13 | Payer: Self-pay | Admitting: Family Medicine

## 2008-03-30 ENCOUNTER — Encounter: Payer: Self-pay | Admitting: Family Medicine

## 2008-04-01 ENCOUNTER — Ambulatory Visit: Admission: RE | Admit: 2008-04-01 | Discharge: 2008-04-01 | Payer: Self-pay | Admitting: Family Medicine

## 2008-04-01 ENCOUNTER — Encounter: Payer: Self-pay | Admitting: Family Medicine

## 2008-04-30 DIAGNOSIS — R143 Flatulence: Secondary | ICD-10-CM

## 2008-04-30 DIAGNOSIS — K589 Irritable bowel syndrome without diarrhea: Secondary | ICD-10-CM | POA: Insufficient documentation

## 2008-04-30 DIAGNOSIS — C4491 Basal cell carcinoma of skin, unspecified: Secondary | ICD-10-CM | POA: Insufficient documentation

## 2008-04-30 DIAGNOSIS — M129 Arthropathy, unspecified: Secondary | ICD-10-CM | POA: Insufficient documentation

## 2008-04-30 DIAGNOSIS — K208 Other esophagitis without bleeding: Secondary | ICD-10-CM | POA: Insufficient documentation

## 2008-04-30 DIAGNOSIS — R141 Gas pain: Secondary | ICD-10-CM | POA: Insufficient documentation

## 2008-04-30 DIAGNOSIS — R142 Eructation: Secondary | ICD-10-CM

## 2008-04-30 DIAGNOSIS — R110 Nausea: Secondary | ICD-10-CM | POA: Insufficient documentation

## 2008-04-30 DIAGNOSIS — Z8719 Personal history of other diseases of the digestive system: Secondary | ICD-10-CM | POA: Insufficient documentation

## 2008-05-03 ENCOUNTER — Ambulatory Visit: Payer: Self-pay | Admitting: Internal Medicine

## 2008-05-11 DIAGNOSIS — K219 Gastro-esophageal reflux disease without esophagitis: Secondary | ICD-10-CM | POA: Insufficient documentation

## 2008-05-11 DIAGNOSIS — K5730 Diverticulosis of large intestine without perforation or abscess without bleeding: Secondary | ICD-10-CM | POA: Insufficient documentation

## 2008-05-11 DIAGNOSIS — K5900 Constipation, unspecified: Secondary | ICD-10-CM | POA: Insufficient documentation

## 2008-05-24 ENCOUNTER — Ambulatory Visit: Payer: Self-pay | Admitting: Family Medicine

## 2008-05-24 LAB — CONVERTED CEMR LAB
Bilirubin Urine: NEGATIVE
Blood in Urine, dipstick: NEGATIVE
Hgb A1c MFr Bld: 6.3 %
Nitrite: NEGATIVE
Urobilinogen, UA: 0.2
pH: 5

## 2008-05-25 ENCOUNTER — Encounter: Payer: Self-pay | Admitting: Family Medicine

## 2008-06-18 ENCOUNTER — Telehealth (INDEPENDENT_AMBULATORY_CARE_PROVIDER_SITE_OTHER): Payer: Self-pay | Admitting: *Deleted

## 2008-06-28 ENCOUNTER — Encounter: Payer: Self-pay | Admitting: Family Medicine

## 2008-07-02 ENCOUNTER — Ambulatory Visit: Payer: Self-pay | Admitting: Cardiology

## 2008-07-08 ENCOUNTER — Encounter: Payer: Self-pay | Admitting: Cardiology

## 2008-07-08 ENCOUNTER — Ambulatory Visit: Payer: Self-pay | Admitting: Cardiology

## 2008-08-24 ENCOUNTER — Encounter: Payer: Self-pay | Admitting: Family Medicine

## 2008-09-07 LAB — CONVERTED CEMR LAB
AST: 19 units/L (ref 0–37)
BUN: 25 mg/dL — ABNORMAL HIGH (ref 6–23)
Bilirubin, Direct: 0.1 mg/dL (ref 0.0–0.3)
CO2: 24 meq/L (ref 19–32)
Indirect Bilirubin: 0.6 mg/dL (ref 0.0–0.9)
Potassium: 5.5 meq/L — ABNORMAL HIGH (ref 3.5–5.3)
Sodium: 141 meq/L (ref 135–145)
TSH: 2.254 microintl units/mL (ref 0.350–4.500)
Total Protein: 7 g/dL (ref 6.0–8.3)

## 2008-09-09 ENCOUNTER — Ambulatory Visit: Payer: Self-pay | Admitting: Family Medicine

## 2008-09-09 DIAGNOSIS — H547 Unspecified visual loss: Secondary | ICD-10-CM | POA: Insufficient documentation

## 2008-09-09 LAB — CONVERTED CEMR LAB
Glucose, Bld: 130 mg/dL
Hgb A1c MFr Bld: 7 %

## 2008-09-10 ENCOUNTER — Encounter: Payer: Self-pay | Admitting: Family Medicine

## 2008-09-10 LAB — CONVERTED CEMR LAB
Creatinine, Urine: 92.3 mg/dL
Microalb Creat Ratio: 8.8 mg/g (ref 0.0–30.0)

## 2008-09-12 DIAGNOSIS — E663 Overweight: Secondary | ICD-10-CM | POA: Insufficient documentation

## 2008-09-15 ENCOUNTER — Encounter: Payer: Self-pay | Admitting: Family Medicine

## 2008-09-28 DIAGNOSIS — I080 Rheumatic disorders of both mitral and aortic valves: Secondary | ICD-10-CM | POA: Insufficient documentation

## 2008-10-18 ENCOUNTER — Telehealth: Payer: Self-pay | Admitting: Family Medicine

## 2008-10-22 ENCOUNTER — Encounter: Payer: Self-pay | Admitting: Family Medicine

## 2008-10-25 ENCOUNTER — Encounter: Payer: Self-pay | Admitting: Family Medicine

## 2008-11-01 ENCOUNTER — Ambulatory Visit: Payer: Self-pay | Admitting: Family Medicine

## 2008-12-20 ENCOUNTER — Encounter: Payer: Self-pay | Admitting: Family Medicine

## 2008-12-29 ENCOUNTER — Ambulatory Visit (HOSPITAL_COMMUNITY): Admission: RE | Admit: 2008-12-29 | Discharge: 2008-12-29 | Payer: Self-pay | Admitting: Family Medicine

## 2009-01-17 ENCOUNTER — Encounter: Payer: Self-pay | Admitting: Family Medicine

## 2009-01-19 ENCOUNTER — Ambulatory Visit: Payer: Self-pay | Admitting: Family Medicine

## 2009-01-19 LAB — CONVERTED CEMR LAB: Hgb A1c MFr Bld: 7.5 %

## 2009-01-28 ENCOUNTER — Ambulatory Visit: Payer: Self-pay | Admitting: Cardiology

## 2009-01-28 DIAGNOSIS — J019 Acute sinusitis, unspecified: Secondary | ICD-10-CM | POA: Insufficient documentation

## 2009-01-28 DIAGNOSIS — I4892 Unspecified atrial flutter: Secondary | ICD-10-CM | POA: Insufficient documentation

## 2009-01-31 ENCOUNTER — Encounter: Admission: RE | Admit: 2009-01-31 | Discharge: 2009-01-31 | Payer: Self-pay | Admitting: Sports Medicine

## 2009-02-08 ENCOUNTER — Encounter: Payer: Self-pay | Admitting: Family Medicine

## 2009-02-15 ENCOUNTER — Telehealth (INDEPENDENT_AMBULATORY_CARE_PROVIDER_SITE_OTHER): Payer: Self-pay | Admitting: *Deleted

## 2009-03-10 ENCOUNTER — Encounter: Payer: Self-pay | Admitting: Family Medicine

## 2009-03-10 LAB — CONVERTED CEMR LAB
ALT: 15 units/L (ref 0–35)
AST: 22 units/L (ref 0–37)
BUN: 27 mg/dL — ABNORMAL HIGH (ref 6–23)
Bilirubin, Direct: 0.1 mg/dL (ref 0.0–0.3)
CO2: 24 meq/L (ref 19–32)
Calcium: 9.6 mg/dL (ref 8.4–10.5)
HDL: 72 mg/dL (ref >39)
Indirect Bilirubin: 0.3 mg/dL (ref 0.0–0.9)
LDL Cholesterol: 72 mg/dL (ref 0–99)
Potassium: 5.5 meq/L — ABNORMAL HIGH (ref 3.5–5.3)
Total CHOL/HDL Ratio: 2.4
Total Protein: 7 g/dL (ref 6.0–8.3)

## 2009-03-15 ENCOUNTER — Ambulatory Visit: Payer: Self-pay | Admitting: Family Medicine

## 2009-04-14 ENCOUNTER — Ambulatory Visit: Payer: Self-pay | Admitting: Family Medicine

## 2009-04-14 LAB — CONVERTED CEMR LAB
Glucose, Urine, Semiquant: NEGATIVE
Nitrite: NEGATIVE

## 2009-04-16 ENCOUNTER — Encounter: Payer: Self-pay | Admitting: Physician Assistant

## 2009-04-20 ENCOUNTER — Encounter: Payer: Self-pay | Admitting: Family Medicine

## 2009-05-09 ENCOUNTER — Encounter: Payer: Self-pay | Admitting: Family Medicine

## 2009-05-20 LAB — CONVERTED CEMR LAB
Eosinophils Relative: 12 % — ABNORMAL HIGH (ref 0–5)
Hgb A1c MFr Bld: 7.3 % — ABNORMAL HIGH (ref <5.7)
Lymphs Abs: 2.4 10*3/uL (ref 0.7–4.0)
MCV: 94 fL (ref 78.0–100.0)
Monocytes Absolute: 0.5 10*3/uL (ref 0.1–1.0)

## 2009-07-26 ENCOUNTER — Other Ambulatory Visit: Admission: RE | Admit: 2009-07-26 | Discharge: 2009-07-26 | Payer: Self-pay | Admitting: Family Medicine

## 2009-07-26 ENCOUNTER — Ambulatory Visit: Payer: Self-pay | Admitting: Family Medicine

## 2009-07-26 DIAGNOSIS — N3090 Cystitis, unspecified without hematuria: Secondary | ICD-10-CM | POA: Insufficient documentation

## 2009-07-26 LAB — CONVERTED CEMR LAB
Bilirubin Urine: NEGATIVE
Glucose, Urine, Semiquant: NEGATIVE
Nitrite: NEGATIVE
OCCULT 1: NEGATIVE
Specific Gravity, Urine: 1.025
Urobilinogen, UA: 0.2
WBC Urine, dipstick: NEGATIVE
pH: 5

## 2009-07-27 LAB — CONVERTED CEMR LAB: Pap Smear: NEGATIVE

## 2009-08-11 ENCOUNTER — Ambulatory Visit (HOSPITAL_COMMUNITY)
Admission: RE | Admit: 2009-08-11 | Discharge: 2009-08-11 | Payer: Self-pay | Source: Home / Self Care | Admitting: Family Medicine

## 2009-08-11 ENCOUNTER — Ambulatory Visit: Payer: Self-pay | Admitting: Family Medicine

## 2009-08-22 ENCOUNTER — Encounter: Payer: Self-pay | Admitting: Family Medicine

## 2009-08-23 ENCOUNTER — Encounter: Payer: Self-pay | Admitting: Family Medicine

## 2009-09-01 HISTORY — PX: BASAL CELL CARCINOMA EXCISION: SHX1214

## 2009-09-09 ENCOUNTER — Encounter: Payer: Self-pay | Admitting: Family Medicine

## 2009-09-13 ENCOUNTER — Telehealth (INDEPENDENT_AMBULATORY_CARE_PROVIDER_SITE_OTHER): Payer: Self-pay | Admitting: *Deleted

## 2009-09-21 ENCOUNTER — Telehealth: Payer: Self-pay | Admitting: Family Medicine

## 2009-10-03 LAB — CONVERTED CEMR LAB
ALT: 14 units/L (ref 0–35)
AST: 18 units/L (ref 0–37)
Albumin: 4.8 g/dL (ref 3.5–5.2)
Alkaline Phosphatase: 31 units/L — ABNORMAL LOW (ref 39–117)
Bilirubin, Direct: 0.1 mg/dL (ref 0.0–0.3)
Calcium: 10.2 mg/dL (ref 8.4–10.5)
Creatinine, Urine: 224.1 mg/dL
Eosinophils Absolute: 0.7 10*3/uL (ref 0.0–0.7)
Eosinophils Relative: 8 % — ABNORMAL HIGH (ref 0–5)
Glucose, Bld: 166 mg/dL — ABNORMAL HIGH (ref 70–99)
HCT: 36 % (ref 36.0–46.0)
Hemoglobin: 11.7 g/dL — ABNORMAL LOW (ref 12.0–15.0)
Hgb A1c MFr Bld: 6.9 % — ABNORMAL HIGH (ref <5.7)
Indirect Bilirubin: 0.3 mg/dL (ref 0.0–0.9)
Lymphs Abs: 2.8 10*3/uL (ref 0.7–4.0)
MCV: 95 fL (ref 78.0–100.0)
Microalb Creat Ratio: 14.3 mg/g (ref 0.0–30.0)
Microalb, Ur: 3.21 mg/dL — ABNORMAL HIGH (ref 0.00–1.89)
Monocytes Absolute: 0.7 10*3/uL (ref 0.1–1.0)
Neutro Abs: 4.5 10*3/uL (ref 1.7–7.7)
Sodium: 139 meq/L (ref 135–145)
Total Bilirubin: 0.4 mg/dL (ref 0.3–1.2)
Total CHOL/HDL Ratio: 2.4

## 2009-10-04 ENCOUNTER — Ambulatory Visit: Payer: Self-pay | Admitting: Family Medicine

## 2009-11-07 ENCOUNTER — Telehealth: Payer: Self-pay | Admitting: Family Medicine

## 2009-11-29 ENCOUNTER — Telehealth (INDEPENDENT_AMBULATORY_CARE_PROVIDER_SITE_OTHER): Payer: Self-pay | Admitting: *Deleted

## 2009-12-07 ENCOUNTER — Ambulatory Visit: Payer: Self-pay | Admitting: Family Medicine

## 2009-12-30 ENCOUNTER — Ambulatory Visit (HOSPITAL_COMMUNITY)
Admission: RE | Admit: 2009-12-30 | Discharge: 2009-12-30 | Payer: Self-pay | Source: Home / Self Care | Attending: Family Medicine | Admitting: Family Medicine

## 2010-01-21 ENCOUNTER — Encounter: Payer: Self-pay | Admitting: Family Medicine

## 2010-01-22 ENCOUNTER — Encounter: Payer: Self-pay | Admitting: Family Medicine

## 2010-01-31 NOTE — Progress Notes (Signed)
Summary: [facility]   Imported By: Lind Guest 05/27/2009 08:11:54  _____________________________________________________________________  External Attachment:    Type:   Image     Comment:   External Document

## 2010-01-31 NOTE — Assessment & Plan Note (Signed)
Summary: physical   Vital Signs:  Patient profile:   77 year old female Menstrual status:  hysterectomy Height:      63 inches Weight:      178.50 pounds BMI:     31.73 O2 Sat:      97 % Pulse rate:   58 / minute Pulse rhythm:   regular Resp:     16 per minute BP sitting:   138 / 74  (left arm)  Vitals Entered By: Everitt Amber LPN (July 26, 2009 9:22 AM)  Nutrition Counseling: Patient's BMI is greater than 25 and therefore counseled on weight management options. CC: CPE    Primary Care Provider:  Dr. Syliva Overman  CC:  CPE .  History of Present Illness: Reports  thatshe has been doing fairly well. Unfortunately she again had a recent fall at home. Denies recent fever or chills. Denies sinus pressure, nasal congestion , ear pain or sore throat. Denies chest congestion, or cough productive of sputum. Denies chest pain, palpitations, PND, orthopnea or leg swelling. Denies abdominal pain, nausea, vomitting, diarrhea or constipation. Denies change in bowel movements or bloody stool.  She has chronicsevere back pai due to severe osteoarthritis, pain has bee n worse since her fallshe does have  reduced mobility. Denies headaches, vertigo, seizures. Denies depression, anxiety or insomnia.Thwese are controlled on meds Denies  rash, lesions, or itch.     Current Medications (verified): 1)  Hydrocodone-Acetaminophen 10-500 Mg  Tabs (Hydrocodone-Acetaminophen) .... One Tab By Mouth Four Times Daily As Needed 2)  Bl Vitamin E 200 Unit  Caps (Vitamin E) .... One Cap By Mouth Once Daily 3)  Biotin 1000 Mcg  Tabs (Biotin) .... One Tab By Mouth Once Daily 4)  Diltiazem Hcl Cr 240 Mg  Cp24 (Diltiazem Hcl) .... One Cap By Mouth Once Daily 5)  Vitamin C 500 Mg  Tabs (Ascorbic Acid) .... One Tab By Mouth Once Daily 6)  Omeprazole 20 Mg  Tbec (Omeprazole) .... One Tab By Mouth Once Daily 7)  Fish Oil 1200 Mg  Caps (Omega-3 Fatty Acids) .... Two Caps By Mouth Two Times A Day 8)  Os-Cal  Ultra 600 Mg  Tabs (Calcium Carb-Vit D-C-E-Mineral) .... One Tab By Mouth Two Times A Day 9)  Crestor 40 Mg Tabs (Rosuvastatin Calcium) .... One Tab By Mouth Qhs 10)  Celexa 40 Mg Tabs (Citalopram Hydrobromide) .... Take 1 Tablet By Mouth Once A Day 11)  Metanx 2.8-25-2 Mg Tabs (L-Methylfolate-B6-B12) .... Take 1 Tablet By Mouth Once A Day 12)  Niaspan 500 Mg Cr-Tabs (Niacin (Antihyperlipidemic)) .... Uad 13)  Tricor 145 Mg Tabs (Fenofibrate) .... One Tab By Mouth Qhs 14)  Meloxicam 15 Mg Tabs (Meloxicam) .... One Tab By Mouth Qd 15)  Lotensin Hct 20-12.5 Mg Tabs (Benazepril-Hydrochlorothiazide) .... Take 1 Tablet By Mouth Two Times A Day 16)  Adult Aspirin Low Strength 81 Mg Tbdp (Aspirin) .... Once Daily 17)  Glyburide-Metformin 2.5-500 Mg Tabs (Glyburide-Metformin) .... 2 Tablets Twice Daily 18)  Vitamin C 500 Mg Tabs (Ascorbic Acid) .... Take 1 Tablet By Mouth Once A Day 19)  Glucosamine-Chondroitin 250-500 Mg Caps (Glucosamine-Chondroitin) .... Take 1 Tablet By Mouth Three Times A Day  Allergies (verified): 1)  ! Sulfa 2)  ! Prinivil 3)  ! Xanax 4)  ! Zoloft  Review of Systems      See HPI General:  Complains of fatigue, malaise, and weakness. Eyes:  Complains of vision loss-both eyes; denies discharge, eye pain, and red eye.  GU:  Complains of dysuria and urinary frequency; 2 day history. MS:  Complains of joint pain, low back pain, muscle aches, and stiffness. Derm:  Denies itching. Neuro:  Complains of falling down and poor balance; denies headaches, seizures, and sensation of room spinning. Psych:  Complains of anxiety and depression; denies mental problems, suicidal thoughts/plans, thoughts of violence, and unusual visions or sounds; controlled on meds. Endo:  Denies excessive thirst and excessive urination; blood sugars noted to fluctuate with diet, however avg around 140. Heme:  Complains of abnormal bruising; denies bleeding; bruiseseasily. Allergy:  Denies hives or rash and  itching eyes.  Physical Exam  General:  Well-developed,well-nourished,in no acute distress; alert,appropriate and cooperative throughout examination Head:  Normocephalic and atraumatic without obvious abnormalities. No apparent alopecia or balding. Eyes:  vision grossly intact, pupils equal, and pupils round.   Ears:  External ear exam shows no significant lesions or deformities.  Otoscopic examination reveals clear canals, tympanic membranes are intact bilaterally without bulging, retraction, inflammation or discharge. Hearing is grossly normal bilaterally. Nose:  External nasal examination shows no deformity or inflammation. Nasal mucosa are pink and moist without lesions or exudates. Mouth:  pharynx pink and moist, fair dentition, and teeth missing.   Neck:  No deformities, masses, or tenderness noted. Chest Wall:  No deformities, masses, or tenderness noted. Breasts:  No mass, nodules, thickening, tenderness, bulging, retraction, inflamation, nipple discharge or skin changes noted.   Lungs:  Normal respiratory effort, chest expands symmetrically. Lungs are clear to auscultation, no crackles or wheezes. Heart:  Normal rate and regular rhythm. S1 and S2 normal without gallop, murmur, click, rub or other extra sounds. Abdomen:  Bowel sounds positive,abdomen soft and non-tender without masses, organomegaly or hernias noted. Rectal:  No external abnormalities noted. Normal sphincter tone. No rectal masses or tenderness.Guaic negative stool Genitalia:  normal introitus, no external lesions, and no adnexal masses or tenderness.   Msk:  No deformity or scoliosis noted of thoracic or lumbar spine.   Pulses:  R and L carotid,radial,femoral,dorsalis pedis and posterior tibial pulses are full and equal bilaterally Extremities:  decreased rOM thoracolumbar spine  Neurologic:  alert & oriented X3, cranial nerves II-XII intact, strength normal in all extremities, and DTRs symmetrical and normal.  decreased  sensation in the feet Skin:  Intact without suspicious lesions or rashesBruises present due to capillary fragility Cervical Nodes:  No lymphadenopathy noted Axillary Nodes:  No palpable lymphadenopathy Inguinal Nodes:  No significant adenopathy Psych:  Cognition and judgment appear intact. Alert and cooperative with normal attention span and concentration. No apparent delusions, illusions, hallucinations  Diabetes Management Exam:    Foot Exam (with socks and/or shoes not present):       Sensory-Monofilament:          Left foot: diminished          Right foot: diminished       Inspection:          Left foot: normal          Right foot: normal       Nails:          Left foot: thickened          Right foot: thickened   Impression & Recommendations:  Problem # 1:  ROUTINE GYNECOLOGICAL EXAMINATION (ICD-V72.31) Assessment Comment Only  Orders: Pelvic & Breast Exam ( Medicare)  (G0101)  Problem # 2:  SPECIAL SCREENING FOR MALIGNANT NEOPLASMS COLON (ICD-V76.51) Assessment: Comment Only  Orders: Hemoccult Guaiac-1 spec.(in  office) [phone])  Problem # 3:  CYSTITIS (ICD-595.9) Assessment: Comment Only  Orders: UA Dipstick w/o Micro (automated)  (81003)no evidence of infection, pt reassured  Encouraged to push clear liquids, get enough rest, and take acetaminophen as needed. To be seen in 10 days if no improvement, sooner if worse.  Problem # 4:  HYPERCHOLESTEROLEMIA (ICD-272.0) Assessment: Comment Only  Her updated medication list for this problem includes:    Crestor 40 Mg Tabs (Rosuvastatin calcium) ..... One tab by mouth qhs    Niaspan 500 Mg Cr-tabs (Niacin (antihyperlipidemic)) ..... Uad    Tricor 145 Mg Tabs (Fenofibrate) ..... One tab by mouth qhs  Labs Reviewed: SGOT: 22 (03/10/2009)   SGPT: 15 (03/10/2009)   HDL:72 (03/10/2009), 62 (09/02/2008)  LDL:72 (03/10/2009), 55 (09/02/2008)  Chol:173 (03/10/2009), 148 (09/02/2008)  Trig:147 (03/10/2009), 157  (09/02/2008)  Problem # 5:  DEPRESSION (ICD-311) Assessment: Improved  Her updated medication list for this problem includes:    Celexa 40 Mg Tabs (Citalopram hydrobromide) .Marland Kitchen... Take 1 tablet by mouth once a day  Problem # 6:  BACK PAIN WITH RADICULOPATHY (ICD-729.2) Assessment: Unchanged  Problem # 7:  HYPERTENSION (ICD-401.9) Assessment: Unchanged  Her updated medication list for this problem includes:    Diltiazem Hcl Cr 240 Mg Cp24 (Diltiazem hcl) ..... One cap by mouth once daily    Lotensin Hct 20-12.5 Mg Tabs (Benazepril-hydrochlorothiazide) .Marland Kitchen... Take 1 tablet by mouth two times a day  Orders: T-Basic Metabolic Panel [phone])  BP today: 138/74 Prior BP: 126/64 (04/14/2009)  Labs Reviewed: K+: 5.5 (03/10/2009) Creat: : 0.81 (03/10/2009)   Chol: 173 (03/10/2009)   HDL: 72 (03/10/2009)   LDL: 72 (03/10/2009)   TG: 147 (03/10/2009)  Problem # 8:  DIABETES MELLITUS, TYPE II (ICD-250.00) Assessment: Unchanged  Her updated medication list for this problem includes:    Lotensin Hct 20-12.5 Mg Tabs (Benazepril-hydrochlorothiazide) .Marland Kitchen... Take 1 tablet by mouth two times a day    Adult Aspirin Low Strength 81 Mg Tbdp (Aspirin) ..... Once daily    Glyburide-metformin 2.5-500 Mg Tabs (Glyburide-metformin) .Marland Kitchen... 2 tablets twice daily  Orders: T- Hemoglobin A1C ([id number]) T-Urine Microalbumin w/creat. ratio [phone])  Labs Reviewed: Creat: 0.81 (03/10/2009)    Reviewed HgBA1c results: 7.3 (05/20/2009)  7.5 (01/19/2009)  Complete Medication List: 1)  Hydrocodone-acetaminophen 10-500 Mg Tabs (Hydrocodone-acetaminophen) .... One tab by mouth four times daily as needed 2)  Bl Vitamin E 200 Unit Caps (Vitamin e) .... One cap by mouth once daily 3)  Biotin 1000 Mcg Tabs (Biotin) .... One tab by mouth once daily 4)  Diltiazem Hcl Cr 240 Mg Cp24 (Diltiazem hcl) .... One cap by mouth once daily 5)  Vitamin C 500 Mg Tabs (Ascorbic acid) .... One tab by mouth once  daily 6)  Omeprazole 20 Mg Tbec (Omeprazole) .... One tab by mouth once daily 7)  Fish Oil 1200 Mg Caps (Omega-3 fatty acids) .... Two caps by mouth two times a day 8)  Os-cal Ultra 600 Mg Tabs (Calcium carb-vit d-c-e-mineral) .... One tab by mouth two times a day 9)  Crestor 40 Mg Tabs (Rosuvastatin calcium) .... One tab by mouth qhs 10)  Celexa 40 Mg Tabs (Citalopram hydrobromide) .... Take 1 tablet by mouth once a day 11)  Metanx 2.8-25-2 Mg Tabs (L-methylfolate-b6-b12) .... Take 1 tablet by mouth once a day 12)  Niaspan 500 Mg Cr-tabs (Niacin (antihyperlipidemic)) .... Uad 13)  Tricor 145 Mg Tabs (Fenofibrate) .... One tab by mouth qhs 14)  Meloxicam 15 Mg Tabs (  Meloxicam) .... One tab by mouth qd 15)  Lotensin Hct 20-12.5 Mg Tabs (Benazepril-hydrochlorothiazide) .... Take 1 tablet by mouth two times a day 16)  Adult Aspirin Low Strength 81 Mg Tbdp (Aspirin) .... Once daily 17)  Glyburide-metformin 2.5-500 Mg Tabs (Glyburide-metformin) .... 2 tablets twice daily 18)  Vitamin C 500 Mg Tabs (Ascorbic acid) .... Take 1 tablet by mouth once a day 19)  Glucosamine-chondroitin 250-500 Mg Caps (Glucosamine-chondroitin) .... Take 1 tablet by mouth three times a day  Other Orders: T-Hepatic Function [id number]) T-Lipid Profile [phone]) T-TSH [id number]) T-CBC w/Diff [phone])  Patient Instructions: 1)  f/u in early October. 2)  BMP prior to visit, ICD-9: 3)  Hepatic Panel prior to visit, ICD-9: 4)  Lipid Panel prior to visit, ICD-9:  fasting labs first week in October 5)  TSH prior to visit, ICD-9: 6)  CBC w/ Diff prior to visit, ICD-9: 7)  HbgA1C prior to visit, ICD-9: 8)  Urine Microalbumin prior to visit, ICD-9: 9)  It is important that you exercise regularly at least 20 minutes 5 times a week. If you develop chest pain, have severe difficulty breathing, or feel very tired , stop exercising immediately and seek medical attention. 10)  You need to lose weight. Consider a  lower calorie diet and regular exercise.Pls continue weight watchers. 11)    12)  Check your blood sugars regularly. If your readings are usually above 250 or below 70 you should contact our office.   Laboratory Results   Urine Tests  Date/Time Received: July 26, 2009  Date/Time Reported: July 26, 2009   Routine Urinalysis   Color: lt. yellow Appearance: Clear Glucose: negative   (Normal Range: Negative) Bilirubin: negative   (Normal Range: Negative) Ketone: negative   (Normal Range: Negative) Spec. Gravity: 1.025   (Normal Range: 1.003-1.035) Blood: negative   (Normal Range: Negative) pH: 5.0   (Normal Range: 5.0-8.0) Protein: negative   (Normal Range: Negative) Urobilinogen: 0.2   (Normal Range: 0-1) Nitrite: negative   (Normal Range: Negative) Leukocyte Esterace: negative   (Normal Range: Negative)      Stool - Occult Blood Hemmoccult #1: negative Date: 07/26/2009 Comments: [id number] [id number] 8/11 118 1012

## 2010-01-31 NOTE — Assessment & Plan Note (Signed)
Summary: UTI- ROOM 1   Vital Signs:  Patient profile:   77 year old female Menstrual status:  hysterectomy Height:      63 inches Weight:      180.25 pounds BMI:     32.05 O2 Sat:      95 % on Room air Pulse rate:   62 / minute Resp:     16 per minute BP sitting:   126 / 64  (left arm)  Vitals Entered By: Adella Hare LPN (April 14, 2009 3:05 PM) CC: burning and pressure with urination Is Patient Diabetic? Yes Did you bring your meter with you today? No Pain Assessment Patient in pain? no      Comments PATIENT DID NOT BRING MEDICATIONS   Referring Provider:  [name] Primary Provider:  Dr. Syliva Overman  CC:  burning and pressure with urination.  History of Present Illness: This 77 Years Old White Female comes in today complaining of urinary symptoms starting in the last 2 weeks. But has progressively worsened since yesterday.  She had been drinking cranberry juice to alleviate syptoms but is no longer working.  She is having dysuria & freq.  Hx of incontinence, & has been worsening over the last 6 mos.   Hx of htn. Taking medications as prescribed. No headache, chest pain or palpitations.    Allergies (verified): 1)  ! Sulfa 2)  ! Prinivil 3)  ! Xanax 4)  ! Zoloft  Review of Systems General:  Denies chills and fever. CV:  Denies chest pain or discomfort and palpitations. Resp:  Denies shortness of breath. GI:  Complains of abdominal pain; denies change in bowel habits, loss of appetite, nausea, and vomiting. GU:  Complains of dysuria, incontinence, and urinary frequency.  Physical Exam  General:  Well-developed,well-nourished,in no acute distress; alert,appropriate and cooperative throughout examination Head:  Normocephalic and atraumatic without obvious abnormalities. No apparent alopecia or balding. Ears:  External ear exam shows no significant lesions or deformities.  Otoscopic examination reveals clear canals, tympanic membranes are intact  bilaterally without bulging, retraction, inflammation or discharge. Hearing is grossly normal bilaterally. Nose:  External nasal examination shows no deformity or inflammation. Nasal mucosa are pink and moist without lesions or exudates. Mouth:  Oral mucosa and oropharynx without lesions or exudates.  Teeth in good repair. Lungs:  Normal respiratory effort, chest expands symmetrically. Lungs are clear to auscultation, no crackles or wheezes. Heart:  Normal rate and regular rhythm. S1 and S2 normal without gallop, murmur, click, rub or other extra sounds. Abdomen:  soft, no masses, no hepatomegaly, and no splenomegaly.  TTP Rt mid abd & suprapubic.  No guarding, rebound or rigidity Psych:  Cognition and judgment appear intact. Alert and cooperative with normal attention span and concentration. No apparent delusions, illusions, hallucinations   Impression & Recommendations:  Problem # 1:  ACUTE CYSTITIS (ICD-595.0) Assessment New  The following medications were removed from the medication list:    Penicillin V Potassium 500 Mg Tabs (Penicillin v potassium) .Marland Kitchen... Take 1 tablet by mouth three times a day Her updated medication list for this problem includes:    Ciprofloxacin Hcl 250 Mg Tabs (Ciprofloxacin hcl) .Marland Kitchen... Take 1 two times a day x 7 days  Orders: T-Urinalysis ([id number]) T-Culture, Urine ([id number])  Problem # 2:  HYPERTENSION (ICD-401.9) Assessment: Improved  Her updated medication list for this problem includes:    Diltiazem Hcl Cr 240 Mg Cp24 (Diltiazem hcl) ..... One cap by mouth once daily  Lotensin Hct 20-12.5 Mg Tabs (Benazepril-hydrochlorothiazide) .Marland Kitchen... Take 1 tablet by mouth two times a day  BP today: 126/64 Prior BP: 148/62 (03/15/2009)  Labs Reviewed: K+: 5.5 (03/10/2009) Creat: : 0.81 (03/10/2009)   Chol: 173 (03/10/2009)   HDL: 72 (03/10/2009)   LDL: 72 (03/10/2009)   TG: 147 (03/10/2009)  Complete Medication List: 1)  Hydrocodone-acetaminophen 10-500  Mg Tabs (Hydrocodone-acetaminophen) .... One tab by mouth four times daily as needed 2)  Bl Vitamin E 200 Unit Caps (Vitamin e) .... One cap by mouth once daily 3)  Biotin 1000 Mcg Tabs (Biotin) .... One tab by mouth once daily 4)  Diltiazem Hcl Cr 240 Mg Cp24 (Diltiazem hcl) .... One cap by mouth once daily 5)  Vitamin C 500 Mg Tabs (Ascorbic acid) .... One tab by mouth once daily 6)  Omeprazole 20 Mg Tbec (Omeprazole) .... One tab by mouth once daily 7)  Fish Oil 1200 Mg Caps (Omega-3 fatty acids) .... Two caps by mouth two times a day 8)  Os-cal Ultra 600 Mg Tabs (Calcium carb-vit d-c-e-mineral) .... One tab by mouth two times a day 9)  Crestor 40 Mg Tabs (Rosuvastatin calcium) .... One tab by mouth qhs 10)  Celexa 40 Mg Tabs (Citalopram hydrobromide) .... Take 1 tablet by mouth once a day 11)  Metanx 2.8-25-2 Mg Tabs (L-methylfolate-b6-b12) .... Take 1 tablet by mouth once a day 12)  Niaspan 500 Mg Cr-tabs (Niacin (antihyperlipidemic)) .... Uad 13)  Tricor 145 Mg Tabs (Fenofibrate) .... One tab by mouth qhs 14)  Meloxicam 15 Mg Tabs (Meloxicam) .... One tab by mouth qd 15)  Lotensin Hct 20-12.5 Mg Tabs (Benazepril-hydrochlorothiazide) .... Take 1 tablet by mouth two times a day 16)  Adult Aspirin Low Strength 81 Mg Tbdp (Aspirin) .... Once daily 17)  Glyburide-metformin 2.5-500 Mg Tabs (Glyburide-metformin) .... 2 tablets twice daily 18)  Ciprofloxacin Hcl 250 Mg Tabs (Ciprofloxacin hcl) .... Take 1 two times a day x 7 days  Patient Instructions: 1)  Keep your next appt with Dr Lodema Hong. 2)  Increase fluids. 3)  I have prescribed an antibiotic for your infection. 4)  If your syptoms worsen, you develop fever, or if you do not improve over the next you will need to call the office. Prescriptions: CIPROFLOXACIN HCL 250 MG TABS (CIPROFLOXACIN HCL) take 1 two times a day x 7 days  #14 x 0   Entered and Authorized by:   Esperanza Sheets PA   Signed by:   Esperanza Sheets PA on 04/14/2009   Method  used:   Electronically to        [facility] (retail)       [address]       [place], [place]  [address]       Ph: [phone]       Fax: [phone]   RxID:   [id number]   Laboratory Results   Urine Tests  Date/Time Received: April 14, 2009  Date/Time Reported: April 14, 2009   Routine Urinalysis   Color: yellow Appearance: Clear Glucose: negative   (Normal Range: Negative) Bilirubin: negative   (Normal Range: Negative) Ketone: smal (15)   (Normal Range: Negative) Spec. Gravity: 1.025   (Normal Range: 1.003-1.035) Blood: moderate   (Normal Range: Negative) pH: 5.5   (Normal Range: 5.0-8.0) Protein: 30   (Normal Range: Negative) Urobilinogen: 0.2   (Normal Range: 0-1) Nitrite: negative   (Normal Range: Negative) Leukocyte Esterace: moderate   (  Normal Range: Negative)

## 2010-01-31 NOTE — Progress Notes (Signed)
[facility]: [facility]   Imported By: Lind Guest 02/21/2009 09:51:24  _____________________________________________________________________  External Attachment:    Type:   Image     Comment:   External Document

## 2010-01-31 NOTE — Progress Notes (Signed)
Summary: refill  Phone Note Call from Patient   Summary of Call: pt needs gabapentin increased and sent to [facility].  [phone] Initial call taken by: Rudene Anda,  September 13, 2009 2:32 PM  Follow-up for Phone Call        dose incto 300mg  one twice daily let her know and fax in with note pls Follow-up by: Syliva Overman MD,  September 13, 2009 3:55 PM  Additional Follow-up for Phone Call Additional follow up Details #1::        Called and advised patient that I faxed her prescription in and that the dose was increased. Additional Follow-up by: Curtis Sites,  September 13, 2009 4:05 PM    New/Updated Medications: GABAPENTIN 300 MG CAPS (GABAPENTIN) one capsule in the morning one at midday  and three at bedtime GABAPENTIN 300 MG CAPS (GABAPENTIN) Take 1 capsule by mouth two times a day Prescriptions: GABAPENTIN 300 MG CAPS (GABAPENTIN) Take 1 capsule by mouth two times a day  #60 x 3   Entered and Authorized by:   Syliva Overman MD   Signed by:   Syliva Overman MD on 09/13/2009   Method used:   Printed then faxed to ...       [facility] (retail)       [address]       [place], [place]  [address]       Ph: [phone]       Fax: [phone]   RxID:   [id number] GABAPENTIN 300 MG CAPS (GABAPENTIN) one capsule in the morning one at midday  and three at bedtime  #150 x 3   Entered and Authorized by:   Syliva Overman MD   Signed by:   Syliva Overman MD on 09/13/2009   Method used:   Printed then faxed to ...       [facility] (retail)       [address]       [place], [place]  [address]       Ph: [phone]       Fax: [phone]   RxID:   [id number]

## 2010-01-31 NOTE — Assessment & Plan Note (Signed)
Summary: office visit   Vital Signs:  Patient profile:   77 year old female Menstrual status:  hysterectomy Height:      63 inches Weight:      175.75 pounds BMI:     31.25 O2 Sat:      96 % on Room air Pulse rate:   68 / minute Pulse rhythm:   regular Resp:     16 per minute BP sitting:   154 / 70  (left arm)  Vitals Entered By: Adella Hare LPN (August 11, 2009 9:50 AM)  Nutrition Counseling: Patient's BMI is greater than 25 and therefore counseled on weight management options.  O2 Flow:  Room air CC: bilateral leg pain Is Patient Diabetic? Yes Did you bring your meter with you today? No Pain Assessment Patient in pain? yes     Location: bilateral leg pain Type: aching Onset of pain  Constant Comments did not bring meds to ov   Primary Care Provider:  Dr. Syliva Overman  CC:  bilateral leg pain.  History of Present Illness: States the pain in her back to lower legs has been worse in the past 3 weeks, left leg is worse than the right. She had epidural in June, since then she tripped l on a fan and the pain has worsened.She reports pain primarily from the low back radiaiting down both lower extremeties, left greater than right.She also has tingling and numbness.she denies incontinence of urine. she denies any recent fever or chills. She reports good blood sugar values, she is testing regulalrly.    Allergies (verified): 1)  ! Sulfa 2)  ! Prinivil 3)  ! Xanax 4)  ! Zoloft  Review of Systems      See HPI General:  Complains of fatigue and sleep disorder. Eyes:  Complains of vision loss-both eyes; denies discharge and red eye. ENT:  Denies hoarseness and nasal congestion. CV:  Denies chest pain or discomfort, palpitations, and swelling of feet. Resp:  Denies cough and sputum productive. GI:  Denies abdominal pain, constipation, diarrhea, nausea, and vomiting. GU:  Denies dysuria and urinary frequency. MS:  Complains of joint pain, low back pain, mid back  pain, muscle weakness, and stiffness; increase low back pain and lower ext pain x 3 weeks, pain meds not helping. Psych:  Complains of anxiety and depression; denies mental problems, panic attacks, and suicidal thoughts/plans. Endo:  Denies excessive hunger and excessive thirst. Heme:  Complains of abnormal bruising; denies bleeding. Allergy:  Denies hives or rash and itching eyes.  Physical Exam  General:  Well-developed,well-nourished,in no acute distress; alert,appropriate and cooperative throughout examination.pt in pain, and has difficulty ambulating HEENT: No facial asymmetry,  EOMI, No sinus tenderness, TM's Clear, oropharynx  pink and moist.   Chest: Clear to auscultation bilaterally.  CVS: S1, S2, No murmurs, No S3.   Abd: Soft, Nontender.  ZO:[id number]  ROM spine,adequate in  hips, shoulders and reduced in  knees.  Ext: No edema.   CNS: CN 2-12 intact, power tone and sensation normal throughout.   Skin: Intact, no visible lesions or rashes.  Psych: Good eye contact, normal affect.  Memory intact, not anxious or depressed appearing.    Impression & Recommendations:  Problem # 1:  BACK PAIN WITH RADICULOPATHY (ICD-729.2) Assessment Deteriorated  Orders: Diagnostic X-Ray/Fluoroscopy (Diagnostic X-Ray/Flu) Depo- Medrol 80mg  (J1040) Ketorolac-Toradol 15mg  (J8119) Admin of Therapeutic Inj  intramuscular or subcutaneous (14782)  Problem # 2:  HYPERTENSION (ICD-401.9) Assessment: Deteriorated  Her updated medication list for  this problem includes:    Diltiazem Hcl Cr 240 Mg Cp24 (Diltiazem hcl) ..... One cap by mouth once daily    Lotensin Hct 20-12.5 Mg Tabs (Benazepril-hydrochlorothiazide) .Marland Kitchen... Take 1 tablet by mouth two times a day  BP today: 154/70, increased due to pain and poor sleep Prior BP: 138/74 (07/26/2009)  Labs Reviewed: K+: 5.5 (03/10/2009) Creat: : 0.81 (03/10/2009)   Chol: 173 (03/10/2009)   HDL: 72 (03/10/2009)   LDL: 72 (03/10/2009)   TG: 147  (03/10/2009)  Problem # 3:  HYPERLIPIDEMIA (ICD-272.4) Assessment: Comment Only  Her updated medication list for this problem includes:    Crestor 40 Mg Tabs (Rosuvastatin calcium) ..... One tab by mouth qhs    Niaspan 500 Mg Cr-tabs (Niacin (antihyperlipidemic)) ..... Uad    Tricor 145 Mg Tabs (Fenofibrate) ..... One tab by mouth qhs  Labs Reviewed: SGOT: 22 (03/10/2009)   SGPT: 15 (03/10/2009)   HDL:72 (03/10/2009), 62 (09/02/2008)  LDL:72 (03/10/2009), 55 (09/02/2008)  Chol:173 (03/10/2009), 148 (09/02/2008)  Trig:147 (03/10/2009), 157 (09/02/2008)  Problem # 4:  DIABETES MELLITUS, TYPE II (ICD-250.00) Assessment: Comment Only  Her updated medication list for this problem includes:    Lotensin Hct 20-12.5 Mg Tabs (Benazepril-hydrochlorothiazide) .Marland Kitchen... Take 1 tablet by mouth two times a day    Adult Aspirin Low Strength 81 Mg Tbdp (Aspirin) ..... Once daily    Glyburide-metformin 2.5-500 Mg Tabs (Glyburide-metformin) .Marland Kitchen... 2 tablets twice daily  Labs Reviewed: Creat: 0.81 (03/10/2009)    Reviewed HgBA1c results: 7.3 (05/20/2009)  7.5 (01/19/2009)  Complete Medication List: 1)  Hydrocodone-acetaminophen 10-500 Mg Tabs (Hydrocodone-acetaminophen) .... One tab by mouth four times daily as needed 2)  Bl Vitamin E 200 Unit Caps (Vitamin e) .... One cap by mouth once daily 3)  Biotin 1000 Mcg Tabs (Biotin) .... One tab by mouth once daily 4)  Diltiazem Hcl Cr 240 Mg Cp24 (Diltiazem hcl) .... One cap by mouth once daily 5)  Vitamin C 500 Mg Tabs (Ascorbic acid) .... One tab by mouth once daily 6)  Omeprazole 20 Mg Tbec (Omeprazole) .... One tab by mouth once daily 7)  Fish Oil 1200 Mg Caps (Omega-3 fatty acids) .... Two caps by mouth two times a day 8)  Os-cal Ultra 600 Mg Tabs (Calcium carb-vit d-c-e-mineral) .... One tab by mouth two times a day 9)  Crestor 40 Mg Tabs (Rosuvastatin calcium) .... One tab by mouth qhs 10)  Celexa 40 Mg Tabs (Citalopram hydrobromide) .... Take 1 tablet  by mouth once a day 11)  Metanx 2.8-25-2 Mg Tabs (L-methylfolate-b6-b12) .... Take 1 tablet by mouth once a day 12)  Niaspan 500 Mg Cr-tabs (Niacin (antihyperlipidemic)) .... Uad 13)  Tricor 145 Mg Tabs (Fenofibrate) .... One tab by mouth qhs 14)  Meloxicam 15 Mg Tabs (Meloxicam) .... One tab by mouth qd 15)  Lotensin Hct 20-12.5 Mg Tabs (Benazepril-hydrochlorothiazide) .... Take 1 tablet by mouth two times a day 16)  Adult Aspirin Low Strength 81 Mg Tbdp (Aspirin) .... Once daily 17)  Glyburide-metformin 2.5-500 Mg Tabs (Glyburide-metformin) .... 2 tablets twice daily 18)  Vitamin C 500 Mg Tabs (Ascorbic acid) .... Take 1 tablet by mouth once a day 19)  Glucosamine-chondroitin 250-500 Mg Caps (Glucosamine-chondroitin) .... Take 1 tablet by mouth three times a day 20)  Gabapentin 100 Mg Caps (Gabapentin) .... Three capsules at bedtime  Patient Instructions: 1)  F/U in 4 to 6 weeks 2)  You will get injections today for uncontrolled back pain and I will start you  ion gabapentin. 3)  Start with one capsule at bedtime  for 3 days , then increase if needed to 2 at bedtime for three days then inc if needed to 3 at bedtime. 4)  Pls continue you hydrocone as before Prescriptions: GABAPENTIN 100 MG CAPS (GABAPENTIN) three capsules at bedtime  #90 x 1   Entered and Authorized by:   Syliva Overman MD   Signed by:   Syliva Overman MD on 08/11/2009   Method used:   Electronically to        [facility]* (retail)       [address]       [place], [place]  [address]       Ph: [phone]       Fax: [phone]   RxID:   [id number]    Medication Administration  Injection # 1:    Medication: Depo- Medrol 80mg     Diagnosis: BACK PAIN WITH RADICULOPATHY (ICD-729.2)    Route: IM    Site: RUOQ gluteus    Exp Date: 4/12    Lot #: [id number]    Mfr: Pharmacia    Patient tolerated injection without complications    Given by: Adella Hare LPN (August 11, 2009 10:19  AM)  Injection # 2:    Medication: Ketorolac-Toradol 15mg     Diagnosis: BACK PAIN WITH RADICULOPATHY (ICD-729.2)    Route: IM    Site: LUOQ gluteus    Exp Date: 03/02/2011    Lot #: [id number]    Mfr: novaplus    Comments: toradol 60mg  given    Patient tolerated injection without complications    Given by: Adella Hare LPN (August 11, 2009 10:19 AM)  Orders Added: 1)  Est. Patient Level IV [59563] 2)  Diagnostic X-Ray/Fluoroscopy [Diagnostic X-Ray/Flu] 3)  Depo- Medrol 80mg  [J1040] 4)  Ketorolac-Toradol 15mg  [J1885] 5)  Admin of Therapeutic Inj  intramuscular or subcutaneous [87564]

## 2010-01-31 NOTE — Letter (Signed)
Summary: medical release  medical release   Imported By: Lind Guest 08/11/2009 14:28:51  _____________________________________________________________________  External Attachment:    Type:   Image     Comment:   External Document

## 2010-01-31 NOTE — Progress Notes (Signed)
  Phone Note From Other Clinic   Caller: Jodie/[facility] Details for Reason: pt.Information Initial call taken by: Denny Peon    Faxed all Cardiac over to [phone] [facility]  February 15, 2009 9:14 AM

## 2010-01-31 NOTE — Letter (Signed)
Summary: [facility]   Imported By: Lind Guest 10/05/2009 10:14:04  _____________________________________________________________________  External Attachment:    Type:   Image     Comment:   External Document

## 2010-01-31 NOTE — Assessment & Plan Note (Signed)
Summary: office visit   Vital Signs:  Patient profile:   77 year old female Menstrual status:  hysterectomy Height:      63 inches Weight:      180.75 pounds BMI:     32.13 O2 Sat:      94 % Pulse rate:   65 / minute Pulse rhythm:   regular Resp:     16 per minute BP sitting:   148 / 62  (right arm) Cuff size:   large  Vitals Entered By: Everitt Amber LPN (March 15, 2009 1:59 PM)  Nutrition Counseling: Patient's BMI is greater than 25 and therefore counseled on weight management options. CC: Follow up chronic problems, has been getting some phlegm in her throat that won't go away, feels like a rough spot in there Is Patient Diabetic? Yes   Primary Care Provider:  Dr. Syliva Overman  CC:  Follow up chronic problems, has been getting some phlegm in her throat that won't go away, and feels like a rough spot in there.  History of Present Illness: Pt reports that shwe continues to have significant back pain hich is disabling. seh has not fallen recently , but is at times unsteady on her feet. he denies any recent fever or chills. She denies symptoms of uncontrolled blood sugar and her fasting sugars are seldom over 130. She reports sinus pressure with yellow green drainage. She denies productive cugh.   Current Medications (verified): 1)  Hydrocodone-Acetaminophen 10-500 Mg  Tabs (Hydrocodone-Acetaminophen) .... One Tab By Mouth Four Times Daily As Needed 2)  Bl Vitamin E 200 Unit  Caps (Vitamin E) .... One Cap By Mouth Once Daily 3)  Biotin 1000 Mcg  Tabs (Biotin) .... One Tab By Mouth Once Daily 4)  Diltiazem Hcl Cr 240 Mg  Cp24 (Diltiazem Hcl) .... One Cap By Mouth Once Daily 5)  Vitamin C 500 Mg  Tabs (Ascorbic Acid) .... One Tab By Mouth Once Daily 6)  Omeprazole 20 Mg  Tbec (Omeprazole) .... One Tab By Mouth Once Daily 7)  Fish Oil 1200 Mg  Caps (Omega-3 Fatty Acids) .... Two Caps By Mouth Two Times A Day 8)  Os-Cal Ultra 600 Mg  Tabs (Calcium Carb-Vit D-C-E-Mineral) ....  One Tab By Mouth Two Times A Day 9)  Crestor 40 Mg Tabs (Rosuvastatin Calcium) .... One Tab By Mouth Qhs 10)  Celexa 40 Mg Tabs (Citalopram Hydrobromide) .... Take 1 Tablet By Mouth Once A Day 11)  Metanx 2.8-25-2 Mg Tabs (L-Methylfolate-B6-B12) .... Take 1 Tablet By Mouth Once A Day 12)  Niaspan 500 Mg Cr-Tabs (Niacin (Antihyperlipidemic)) .... Uad 13)  Tricor 145 Mg Tabs (Fenofibrate) .... One Tab By Mouth Qhs 14)  Meloxicam 15 Mg Tabs (Meloxicam) .... One Tab By Mouth Qd 15)  Lotensin Hct 20-12.5 Mg Tabs (Benazepril-Hydrochlorothiazide) .... Take 1 Tablet By Mouth Two Times A Day 16)  Adult Aspirin Low Strength 81 Mg Tbdp (Aspirin) .... Once Daily 17)  Glyburide-Metformin 2.5-500 Mg Tabs (Glyburide-Metformin) .... 2 Tablets Twice Daily  Allergies (verified): 1)  ! Sulfa 2)  ! Prinivil 3)  ! Xanax 4)  ! Zoloft  Review of Systems      See HPI General:  Complains of fatigue; denies chills and fever. Eyes:  Denies blurring and discharge. ENT:  Complains of postnasal drainage; 6 week h/o post nasal drainage which is thick and yellow at times. CV:  Denies chest pain or discomfort, difficulty breathing while lying down, palpitations, and swelling of feet. Resp:  Denies cough and sputum productive. GI:  Denies abdominal pain, constipation, diarrhea, nausea, and vomiting. GU:  Denies dysuria and urinary frequency. MS:  Complains of low back pain; chronic back pain worsenuing hasd epidural Feb 17 and is in PT twice weekly, using glucosamine, may be a little better, followed by neurosurgery Phoebe Perch. Derm:  Denies lesion(s); h/o skin cancer. Neuro:  Denies headaches, seizures, and sensation of room spinning. Psych:  Complains of anxiety and depression; denies mental problems, panic attacks, sense of great danger, suicidal thoughts/plans, thoughts of violence, and unusual visions or sounds. Endo:  Denies cold intolerance, heat intolerance, and polyuria. Heme:  Denies abnormal bruising and  bleeding. Allergy:  Complains of seasonal allergies.  Physical Exam  General:  Well-developed,obese in no acute distress; alert,appropriate and cooperative throughout examination HEENT: No facial asymmetry,  EOMI,positive sinus tenderness, TM's Clear, oropharynx  pink and moist.   Chest: Clear to auscultation bilaterally.  CVS: S1, S2, No murmurs, No S3.   Abd: Soft, Nontender.  RU:EAVWUJWJX ROM spine, hips, shoulders and knees.  Ext: No edema.   CNS: CN 2-12 intact, power tone and sensation normal throughout.   Skin: Intact, no visible lesions or rashes.  Psych: Good eye contact, normal affect.  Memory intact, not anxious or depressed appearing.   Diabetes Management Exam:    Foot Exam (with socks and/or shoes not present):       Sensory-Monofilament:          Left foot: diminished          Right foot: diminished       Inspection:          Left foot: normal          Right foot: normal       Nails:          Left foot: normal          Right foot: normal   Impression & Recommendations:  Problem # 1:  HYPERTENSION (ICD-401.9) Assessment Improved  Her updated medication list for this problem includes:    Diltiazem Hcl Cr 240 Mg Cp24 (Diltiazem hcl) ..... One cap by mouth once daily    Lotensin Hct 20-12.5 Mg Tabs (Benazepril-hydrochlorothiazide) .Marland Kitchen... Take 1 tablet by mouth two times a day  BP today: 148/62 Prior BP: 174/73 (01/28/2009)  Labs Reviewed: K+: 5.5 (03/10/2009) Creat: : 0.81 (03/10/2009)   Chol: 173 (03/10/2009)   HDL: 72 (03/10/2009)   LDL: 72 (03/10/2009)   TG: 147 (03/10/2009)  Problem # 2:  HYPERLIPIDEMIA (ICD-272.4) Assessment: Unchanged  Her updated medication list for this problem includes:    Crestor 40 Mg Tabs (Rosuvastatin calcium) ..... One tab by mouth qhs    Niaspan 500 Mg Cr-tabs (Niacin (antihyperlipidemic)) ..... Uad    Tricor 145 Mg Tabs (Fenofibrate) ..... One tab by mouth qhs  Labs Reviewed: SGOT: 22 (03/10/2009)   SGPT: 15  (03/10/2009)   HDL:72 (03/10/2009), 62 (09/02/2008)  LDL:72 (03/10/2009), 55 (09/02/2008)  Chol:173 (03/10/2009), 148 (09/02/2008)  Trig:147 (03/10/2009), 157 (09/02/2008)  Problem # 3:  ACUTE SINUSITIS, UNSPECIFIED (ICD-461.9) Assessment: Comment Only  The following medications were removed from the medication list:    Veetids 500 Mg Tabs (Penicillin v potassium) .Marland Kitchen... Take 1 tablet by mouth three times a day    Fluticasone Propionate 50 Mcg/act Susp (Fluticasone propionate) .Marland Kitchen..Marland Kitchen Two puffs each nostril once daily Her updated medication list for this problem includes:    Penicillin V Potassium 500 Mg Tabs (Penicillin v potassium) .Marland Kitchen... Take 1 tablet  by mouth three times a day  Problem # 4:  DEPRESSION (ICD-311) Assessment: Improved  Her updated medication list for this problem includes:    Celexa 40 Mg Tabs (Citalopram hydrobromide) .Marland Kitchen... Take 1 tablet by mouth once a day  Problem # 5:  GERD (ICD-530.81) Assessment: Unchanged  Her updated medication list for this problem includes:    Omeprazole 20 Mg Tbec (Omeprazole) ..... One tab by mouth once daily  Problem # 6:  OBESITY, UNSPECIFIED (ICD-278.00) Assessment: Unchanged  Ht: 63 (03/15/2009)   Wt: 180.75 (03/15/2009)   BMI: 32.13 (03/15/2009)  Complete Medication List: 1)  Hydrocodone-acetaminophen 10-500 Mg Tabs (Hydrocodone-acetaminophen) .... One tab by mouth four times daily as needed 2)  Bl Vitamin E 200 Unit Caps (Vitamin e) .... One cap by mouth once daily 3)  Biotin 1000 Mcg Tabs (Biotin) .... One tab by mouth once daily 4)  Diltiazem Hcl Cr 240 Mg Cp24 (Diltiazem hcl) .... One cap by mouth once daily 5)  Vitamin C 500 Mg Tabs (Ascorbic acid) .... One tab by mouth once daily 6)  Omeprazole 20 Mg Tbec (Omeprazole) .... One tab by mouth once daily 7)  Fish Oil 1200 Mg Caps (Omega-3 fatty acids) .... Two caps by mouth two times a day 8)  Os-cal Ultra 600 Mg Tabs (Calcium carb-vit d-c-e-mineral) .... One tab by mouth two  times a day 9)  Crestor 40 Mg Tabs (Rosuvastatin calcium) .... One tab by mouth qhs 10)  Celexa 40 Mg Tabs (Citalopram hydrobromide) .... Take 1 tablet by mouth once a day 11)  Metanx 2.8-25-2 Mg Tabs (L-methylfolate-b6-b12) .... Take 1 tablet by mouth once a day 12)  Niaspan 500 Mg Cr-tabs (Niacin (antihyperlipidemic)) .... Uad 13)  Tricor 145 Mg Tabs (Fenofibrate) .... One tab by mouth qhs 14)  Meloxicam 15 Mg Tabs (Meloxicam) .... One tab by mouth qd 15)  Lotensin Hct 20-12.5 Mg Tabs (Benazepril-hydrochlorothiazide) .... Take 1 tablet by mouth two times a day 16)  Adult Aspirin Low Strength 81 Mg Tbdp (Aspirin) .... Once daily 17)  Glyburide-metformin 2.5-500 Mg Tabs (Glyburide-metformin) .... 2 tablets twice daily 18)  Penicillin V Potassium 500 Mg Tabs (Penicillin v potassium) .... Take 1 tablet by mouth three times a day  Other Orders: T-CBC w/Diff [phone]) T- Hemoglobin A1C (13086-57846) T-Vitamin D (25-Hydroxy) (96295-28413)  Patient Instructions: 1)  CPE in  late July or early August. 2)  I hope that your back pain get less. 3)  CBC w/ Diff prior to visit, ICD-9: 4)  HbgA1C prior to visit, ICD-9:   April 20 or after. 5)  Vit D level 6)  No med chnges aAT THIS TIME Prescriptions: HYDROCODONE-ACETAMINOPHEN 10-500 MG  TABS (HYDROCODONE-ACETAMINOPHEN) one tab by mouth four times daily as needed  #120 x 3   Entered by:   Everitt Amber LPN   Authorized by:   Syliva Overman MD   Signed by:   Everitt Amber LPN on 24/40/1027   Method used:   Printed then faxed to ...       [facility]* (retail)       [address]       [place], [place]  [address]       Ph: [phone]       Fax: [phone]   RxID:   [id number] CRESTOR 40 MG TABS (ROSUVASTATIN CALCIUM) ONE TAB by mouth QHS  #30 x 3   Entered by:   Everitt Amber LPN  Authorized by:   Syliva Overman MD   Signed by:   Everitt Amber LPN on 57/84/6962   Method used:   Electronically to        [facility]*  (retail)       [address]       [place], [place]  [address]       Ph: [phone]       Fax: [phone]   RxID:   [id number] NIASPAN 500 MG CR-TABS (NIACIN (ANTIHYPERLIPIDEMIC)) UAD  #30 x 3   Entered by:   Everitt Amber LPN   Authorized by:   Syliva Overman MD   Signed by:   Everitt Amber LPN on 95/18/8416   Method used:   Electronically to        [facility]* (retail)       [address]       [place], [place]  [address]       Ph: [phone]       Fax: [phone]   RxID:   [id number] CELEXA 40 MG TABS (CITALOPRAM HYDROBROMIDE) Take 1 tablet by mouth once a day  #30 x 3   Entered by:   Everitt Amber LPN   Authorized by:   Syliva Overman MD   Signed by:   Everitt Amber LPN on 60/73/7106   Method used:   Electronically to        [facility]* (retail)       [address]       [place], [place]  [address]       Ph: [phone]       Fax: [phone]   RxID:   [id number] PENICILLIN V POTASSIUM 500 MG TABS (PENICILLIN V POTASSIUM) Take 1 tablet by mouth three times a day  #30 x 0   Entered by:   Everitt Amber LPN   Authorized by:   Syliva Overman MD   Signed by:   Everitt Amber LPN on 25/85/2778   Method used:   Electronically to        [facility]* (retail)       [address]       [place], [place]  [address]       Ph: [phone]       Fax: [phone]   RxID:   [id number]

## 2010-01-31 NOTE — Miscellaneous (Signed)
Summary: refill  Clinical Lists Changes  Medications: Rx of HYDROCODONE-ACETAMINOPHEN 10-500 MG  TABS (HYDROCODONE-ACETAMINOPHEN) one tab by mouth four times daily as needed;  #120 x 0;  Signed;  Entered by: Worthy Keeler LPN;  Authorized by: Syliva Overman MD;  Method used: Printed then faxed to [facility] [address], [place], [place], [place]  [address], Ph: [phone], Fax: [phone]    Prescriptions: HYDROCODONE-ACETAMINOPHEN 10-500 MG  TABS (HYDROCODONE-ACETAMINOPHEN) one tab by mouth four times daily as needed  #120 x 0   Entered by:   Worthy Keeler LPN   Authorized by:   Syliva Overman MD   Signed by:   Worthy Keeler LPN on 57/84/6962   Method used:   Printed then faxed to ...       [facility]* (retail)       [address]       [place], [place]  [address]       Ph: [phone]       Fax: [phone]   RxID:   [id number]

## 2010-01-31 NOTE — Miscellaneous (Signed)
  Clinical Lists Changes  Observations: Added new observation of PAST MED HX: ATRIAL FIBRILLATION (ICD-427.31)   ???? Atrial flutter.... DC cardioversion.... successful.... February, 2008 Coumadin therapy..... discontinued... severe hematoma when something when HYPERTENSION (ICD-401.9) HYPERLIPIDEMIA (ICD-272.4) HYPERCHOLESTEROLEMIA (ICD-272.0) MITRAL REGURGITATION, 0 (MILD) (ICD-396.3)....mild... OBESITY, UNSPECIFIED (ICD-278.00) SLEEP APNEA (ICD-780.57) FATIGUE (ICD-780.79) DEPRESSION (ICD-311) ANXIETY (ICD-300.00) UNSPECIFIED VISUAL LOSS (ICD-369.9) CARCINOMA, BASAL CELL (ICD-173.9) HEMATOCHEZIA, HX OF (ICD-V12.79) HEMATOMA (ICD-924.9) DIABETES MELLITUS, TYPE II (ICD-250.00) BACK PAIN WITH RADICULOPATHY (ICD-729.2) ARTHRITIS (ICD-716.90) OSTEOARTHRITIS (ICD-715.90) ACUTE CYSTITIS (ICD-595.0) GERD (ICD-530.81) DIVERTICULOSIS, COLON (ICD-562.10) CONSTIPATION (ICD-564.00) IRRITABLE BOWEL SYNDROME (ICD-564.1) ABDOMINAL BLOATING (ICD-787.3) NAUSEA (ICD-787.02) EROSIVE ESOPHAGITIS (ICD-530.19) OTHER SCREENING MAMMOGRAM (ICD-V76.12) Malignant melanoma twicwe EF 60%.. echo... July, 2010 Aortic valve sclerosis.... echo... July, 2010 Granulomatous disease..... right chest.... CT chest... November, 2009 Nuclear stress test... September, 2007... no ischemic (01/28/2009 10:01) Added new observation of PRIMARY MD: simpson (01/28/2009 10:01)       Past History:  Past Medical History: ATRIAL FIBRILLATION (ICD-427.31)   ???? Atrial flutter.... DC cardioversion.... successful.... February, 2008 Coumadin therapy..... discontinued... severe hematoma when something when HYPERTENSION (ICD-401.9) HYPERLIPIDEMIA (ICD-272.4) HYPERCHOLESTEROLEMIA (ICD-272.0) MITRAL REGURGITATION, 0 (MILD) (ICD-396.3)....mild... OBESITY, UNSPECIFIED (ICD-278.00) SLEEP APNEA (ICD-780.57) FATIGUE (ICD-780.79) DEPRESSION (ICD-311) ANXIETY (ICD-300.00) UNSPECIFIED VISUAL LOSS (ICD-369.9) CARCINOMA,  BASAL CELL (ICD-173.9) HEMATOCHEZIA, HX OF (ICD-V12.79) HEMATOMA (ICD-924.9) DIABETES MELLITUS, TYPE II (ICD-250.00) BACK PAIN WITH RADICULOPATHY (ICD-729.2) ARTHRITIS (ICD-716.90) OSTEOARTHRITIS (ICD-715.90) ACUTE CYSTITIS (ICD-595.0) GERD (ICD-530.81) DIVERTICULOSIS, COLON (ICD-562.10) CONSTIPATION (ICD-564.00) IRRITABLE BOWEL SYNDROME (ICD-564.1) ABDOMINAL BLOATING (ICD-787.3) NAUSEA (ICD-787.02) EROSIVE ESOPHAGITIS (ICD-530.19) OTHER SCREENING MAMMOGRAM (ICD-V76.12) Malignant melanoma twicwe EF 60%.. echo... July, 2010 Aortic valve sclerosis.... echo... July, 2010 Granulomatous disease..... right chest.... CT chest... November, 2009 Nuclear stress test... September, 2007... no ischemic SH/Risk Factors reviewed for relevance

## 2010-01-31 NOTE — Assessment & Plan Note (Signed)
Summary: FOLLOW UP   Vital Signs:  Patient profile:   77 year old female Menstrual status:  hysterectomy Height:      63 inches Weight:      187.50 pounds O2 Sat:      95 % Pulse rate:   74 / minute Pulse rhythm:   regular Resp:     16 per minute BP sitting:   152 / 70 Cuff size:   large  Vitals Entered By: Everitt Amber (January 19, 2009 2:20 PM) CC: Follow up chronic problems Is Patient Diabetic? Yes   Primary Care Provider:  simpson  CC:  Follow up chronic problems.  History of Present Illness: Reports  that she has been  doing fairly well. Denies recent fever or chills. Denies sinus pressure, nasal congestion , ear pain or sore throat. Denies chest congestion, or cough productive of sputum. Denies chest pain, palpitations, PND, orthopnea or leg swelling. Denies abdominal pain, nausea, vomitting, diarrhea or constipation. Denies change in bowel movements or bloody stool. Denies dysuria , frequency, incontinence or hesitancy. Denies  joint pain, swelling, or reduced mobility. Denies headaches, vertigo, seizures. Denies depression, anxiety or insomnia. Denies  rash, lesions, or itch.     Current Medications (verified): 1)  Hydrocodone-Acetaminophen 10-500 Mg  Tabs (Hydrocodone-Acetaminophen) .... One Tab By Mouth Four Times Daily As Needed 2)  Bl Vitamin E 200 Unit  Caps (Vitamin E) .... One Cap By Mouth Once Daily 3)  Biotin 1000 Mcg  Tabs (Biotin) .... One Tab By Mouth Once Daily 4)  Diltiazem Hcl Cr 240 Mg  Cp24 (Diltiazem Hcl) .... One Cap By Mouth Once Daily 5)  Vitamin C 500 Mg  Tabs (Ascorbic Acid) .... One Tab By Mouth Once Daily 6)  Omeprazole 20 Mg  Tbec (Omeprazole) .... One Tab By Mouth Once Daily 7)  Fish Oil 1200 Mg  Caps (Omega-3 Fatty Acids) .... Two Caps By Mouth Two Times A Day 8)  Os-Cal Ultra 600 Mg  Tabs (Calcium Carb-Vit D-C-E-Mineral) .... One Tab By Mouth Two Times A Day 9)  Crestor 40 Mg Tabs (Rosuvastatin Calcium) .... One Tab By Mouth  Qhs 10)  Celexa 40 Mg Tabs (Citalopram Hydrobromide) .... Take 1 Tablet By Mouth Once A Day 11)  Metanx 2.8-25-2 Mg Tabs (L-Methylfolate-B6-B12) .... Take 1 Tablet By Mouth Once A Day 12)  Niaspan 500 Mg Cr-Tabs (Niacin (Antihyperlipidemic)) .... Uad 13)  Tricor 145 Mg Tabs (Fenofibrate) .... One Tab By Mouth Qhs 14)  Meloxicam 15 Mg Tabs (Meloxicam) .... One Tab By Mouth Qd 15)  Lotensin Hct 20-12.5 Mg Tabs (Benazepril-Hydrochlorothiazide) .... Take 1 Tablet By Mouth Two Times A Day 16)  Adult Aspirin Low Strength 81 Mg Tbdp (Aspirin) .... Once Daily 17)  Glyburide-Metformin 2.5-500 Mg Tabs (Glyburide-Metformin) .... 2 Tablets Twice Daily  Allergies (verified): 1)  ! Sulfa 2)  ! Prinivil 3)  ! Xanax 4)  ! Zoloft  Review of Systems      See HPI General:  Complains of fatigue. Eyes:  Denies double vision. ENT:  Complains of sinus pressure; 6 week h/o head congestion , and in the the past 3 weeks she feels as if she will vomit if she turns her head. CV:  Denies chest pain or discomfort and palpitations. Resp:  Denies cough and sputum productive. GI:  Denies abdominal pain, constipation, diarrhea, nausea, and vomiting. GU:  Denies dysuria and urinary frequency. MS:  Complains of joint pain, low back pain, muscle weakness, and stiffness; increasing low back  paion with lower ext weakness. Derm:  Complains of rash; skin peeling  on her fingers  for months. Neuro:  Denies headaches. Psych:  Complains of anxiety; denies depression and suicidal thoughts/plans. Endo:  Complains of excessive urination; pt notes marked fluctuationin her sugars with poor control. Allergy:  Complains of seasonal allergies.  Physical Exam  General:  Well-developed,obese in no acute distress; alert,appropriate and cooperative throughout examination HEENT: No facial asymmetry,  EOMI,positive sinus tenderness, TM's Clear, oropharynx  pink and moist.   Chest: Clear to auscultation bilaterally.  CVS: S1, S2, No  murmurs, No S3.   Abd: Soft, Nontender.  VW:[id number] ROM spine, hips, shoulders and knees.  Ext: No edema.   CNS: CN 2-12 intact, power tone and sensation normal throughout.   Skin: Intact, no visible lesions or rashes.  Psych: Good eye contact, normal affect.  Memory intact, not anxious or depressed appearing.    Impression & Recommendations:  Problem # 1:  HYPERTENSION (ICD-401.9) Assessment Deteriorated  Her updated medication list for this problem includes:    Diltiazem Hcl Cr 240 Mg Cp24 (Diltiazem hcl) ..... One cap by mouth once daily    Lotensin Hct 20-12.5 Mg Tabs (Benazepril-hydrochlorothiazide) .Marland Kitchen... Take 1 tablet by mouth two times a day  Orders: T-Basic Metabolic Panel [phone])  BP today: 152/70 Prior BP: 120/60 (09/09/2008)  Labs Reviewed: K+: 5.5 (09/02/2008) Creat: : 0.78 (09/02/2008)   Chol: 148 (09/02/2008)   HDL: 62 (09/02/2008)   LDL: 55 (09/02/2008)   TG: 157 (09/02/2008)  Problem # 2:  BACK PAIN WITH RADICULOPATHY (ICD-729.2) Assessment: Deteriorated  Orders: Ketorolac-Toradol 15mg  (M5784) Admin of Therapeutic Inj  intramuscular or subcutaneous (96372)Future Orders: Neurosurgeon Referral (Neurosurgeon) ... 01/20/2009  Problem # 3:  DIABETES MELLITUS, TYPE II (ICD-250.00) Assessment: Deteriorated  The following medications were removed from the medication list:    Glyburide 2.5 Mg Tabs (Glyburide) .Marland Kitchen... Take one tab by mouth two times a day Her updated medication list for this problem includes:    Lotensin Hct 20-12.5 Mg Tabs (Benazepril-hydrochlorothiazide) .Marland Kitchen... Take 1 tablet by mouth two times a day    Adult Aspirin Low Strength 81 Mg Tbdp (Aspirin) ..... Once daily    Glyburide-metformin 2.5-500 Mg Tabs (Glyburide-metformin) .Marland Kitchen... 2 tablets twice daily  Orders: Glucose, (CBG) (82962) Hemoglobin A1C (83036)  Labs Reviewed: Creat: 0.78 (09/02/2008)    Reviewed HgBA1c results: 7.5 (01/19/2009)  7.0 (09/09/2008)  Problem # 4:  ACUTE  SINUSITIS, UNSPECIFIED (ICD-461.9) Assessment: Comment Only  The following medications were removed from the medication list:    Ciprofloxacin Hcl 500 Mg Tabs (Ciprofloxacin hcl) ..... One tab by mouth bid Her updated medication list for this problem includes:    Veetids 500 Mg Tabs (Penicillin v potassium) .Marland Kitchen... Take 1 tablet by mouth three times a day  Complete Medication List: 1)  Hydrocodone-acetaminophen 10-500 Mg Tabs (Hydrocodone-acetaminophen) .... One tab by mouth four times daily as needed 2)  Bl Vitamin E 200 Unit Caps (Vitamin e) .... One cap by mouth once daily 3)  Biotin 1000 Mcg Tabs (Biotin) .... One tab by mouth once daily 4)  Diltiazem Hcl Cr 240 Mg Cp24 (Diltiazem hcl) .... One cap by mouth once daily 5)  Vitamin C 500 Mg Tabs (Ascorbic acid) .... One tab by mouth once daily 6)  Omeprazole 20 Mg Tbec (Omeprazole) .... One tab by mouth once daily 7)  Fish Oil 1200 Mg Caps (Omega-3 fatty acids) .... Two caps by mouth two times a day 8)  Os-cal Ultra 600  Mg Tabs (Calcium carb-vit d-c-e-mineral) .... One tab by mouth two times a day 9)  Crestor 40 Mg Tabs (Rosuvastatin calcium) .... One tab by mouth qhs 10)  Celexa 40 Mg Tabs (Citalopram hydrobromide) .... Take 1 tablet by mouth once a day 11)  Metanx 2.8-25-2 Mg Tabs (L-methylfolate-b6-b12) .... Take 1 tablet by mouth once a day 12)  Niaspan 500 Mg Cr-tabs (Niacin (antihyperlipidemic)) .... Uad 13)  Tricor 145 Mg Tabs (Fenofibrate) .... One tab by mouth qhs 14)  Meloxicam 15 Mg Tabs (Meloxicam) .... One tab by mouth qd 15)  Lotensin Hct 20-12.5 Mg Tabs (Benazepril-hydrochlorothiazide) .... Take 1 tablet by mouth two times a day 16)  Adult Aspirin Low Strength 81 Mg Tbdp (Aspirin) .... Once daily 17)  Glyburide-metformin 2.5-500 Mg Tabs (Glyburide-metformin) .... 2 tablets twice daily 18)  Veetids 500 Mg Tabs (Penicillin v potassium) .... Take 1 tablet by mouth three times a day  Other Orders: T-Hepatic Function  [phone]) T-Lipid Profile [id number]  Patient Instructions: 1)  F/U in 6 weeks. 2)  It is important that you exercise regularly at least 20 minutes 5 times a week. If you develop chest pain, have severe difficulty breathing, or feel very tired , stop exercising immediately and seek medical attention. 3)  You need to lose weight. Consider a lower calorie diet and regular exercise.  4)  Youn will be treated for sinusitis 5)  Your blood sugars are out of control 6)  You  will be referred back to Dr. Herminio Heads 7)  BMP prior to visit, ICD-9: 8)  Hepatic Panel prior to visit, ICD-9:   fasting in 6 weeks 9)  Lipid Panel prior to visit, ICD-9: Prescriptions: VEETIDS 500 MG TABS (PENICILLIN V POTASSIUM) Take 1 tablet by mouth three times a day  #63 x 0   Entered by:   Talitha Givens, MD, [facility]   Authorized by:   Syliva Overman MD   Signed by:   Syliva Overman MD on 01/28/2009   Method used:   Handwritten   RxID:   [id number]   Laboratory Results   Blood Tests   Date/Time Received: January 19, 2009  Date/Time Reported: January 19, 2009   Glucose (random): 148 mg/dL   (Normal Range: 62-952) HGBA1C: 7.5%   (Normal Range: Non-Diabetic - 3-6%   Control Diabetic - 6-8%)      Medication Administration  Injection # 1:    Medication: Ketorolac-Toradol 15mg     Diagnosis: BACK PAIN WITH RADICULOPATHY (ICD-729.2)    Route: IM    Site: RUOQ gluteus    Exp Date: 08/2010    Lot #: [id number]    Mfr: novaplus    Comments: 60 mg given     Patient tolerated injection without complications    Given by: Everitt Amber (January 19, 2009 3:17 PM)  Orders Added: 1)  Glucose, (CBG) [82962] 2)  Hemoglobin A1C [83036] 3)  Est. Patient Level IV [84132] 4)  T-Basic Metabolic Panel [80048-22910] 5)  T-Hepatic Function [80076-22960] 6)  T-Lipid Profile [80061-22930] 7)  Ketorolac-Toradol 15mg  [J1885] 8)  Admin of Therapeutic Inj  intramuscular or subcutaneous [96372] 9)  Neurosurgeon  Referral [Neurosurgeon]

## 2010-01-31 NOTE — Progress Notes (Signed)
Summary: MEDICINE  Phone Note Call from Patient   Summary of Call: NEEDS HER CITALOPRAM SENT TO [facility] Initial call taken by: Lind Guest,  November 29, 2009 9:39 AM  Follow-up for Phone Call        pls let pt know med has been sent Follow-up by: Syliva Overman MD,  November 29, 2009 1:02 PM  Additional Follow-up for Phone Call Additional follow up Details #1::        LEFT MEESAGE  Additional Follow-up by: Lind Guest,  November 29, 2009 1:40 PM    New/Updated Medications: CITALOPRAM HYDROBROMIDE 40 MG TABS (CITALOPRAM HYDROBROMIDE) Take 1 tablet by mouth once a day Prescriptions: CITALOPRAM HYDROBROMIDE 40 MG TABS (CITALOPRAM HYDROBROMIDE) Take 1 tablet by mouth once a day  #30 x 4   Entered and Authorized by:   Syliva Overman MD   Signed by:   Syliva Overman MD on 11/29/2009   Method used:   Electronically to        [facility]* (retail)       [address]       [place], [place]  [address]       Ph: [phone]       Fax: [phone]   RxID:   [id number]

## 2010-01-31 NOTE — Progress Notes (Signed)
Summary: medicine  Phone Note Call from Patient   Summary of Call: taking the gabapentin and wants to stop and dr told her can not stop all at once she wants to know how to back off taking the pill call back at [phone] Initial call taken by: Lind Guest,  September 21, 2009 8:32 AM  Follow-up for Phone Call        one gabapentin at bedtime up to Saturday night then stop, also since sugars are low reduce the glyburibe/metformin to 2 in the morning and ONE in the evening, and get labs  end of next week, hopeyou feel better Follow-up by: Syliva Overman MD,  September 21, 2009 12:06 PM

## 2010-01-31 NOTE — Medication Information (Signed)
Summary: Tax adviser   Imported By: Lind Guest 08/23/2009 08:09:36  _____________________________________________________________________  External Attachment:    Type:   Image     Comment:   External Document

## 2010-01-31 NOTE — Progress Notes (Signed)
Summary: meloxicam  Phone Note Call from Patient   Summary of Call: patient needs her meloxicam 15 mg called into [facility]. Initial call taken by: Curtis Sites,  November 07, 2009 1:56 PM    Prescriptions: MELOXICAM 15 MG TABS (MELOXICAM) one tab by mouth qd  #30 x 3   Entered by:   Adella Hare LPN   Authorized by:   Syliva Overman MD   Signed by:   Adella Hare LPN on 59/56/3875   Method used:   Electronically to        [facility]* (retail)       [address]       [place], [place]  [address]       Ph: [phone]       Fax: [phone]   RxID:   [id number]

## 2010-01-31 NOTE — Progress Notes (Signed)
Summary: PHYSICAL THERAPY REPORT  PHYSICAL THERAPY REPORT   Imported By: Lind Guest 03/25/2009 08:41:45  _____________________________________________________________________  External Attachment:    Type:   Image     Comment:   External Document

## 2010-01-31 NOTE — Miscellaneous (Signed)
  Clinical Lists Changes  Medications: Added new medication of GLYBURIDE-METFORMIN 5-500 MG TABS (GLYBURIDE-METFORMIN) two tablets twice daily - Signed Removed medication of GLYBURIDE-METFORMIN 2.5-500 MG TABS (GLYBURIDE-METFORMIN) 2 tablets twice daily Rx of GLYBURIDE-METFORMIN 5-500 MG TABS (GLYBURIDE-METFORMIN) two tablets twice daily;  #360 x 3;  Signed;  Entered by: Syliva Overman MD;  Authorized by: Syliva Overman MD;  Method used: Handwritten    Prescriptions: GLYBURIDE-METFORMIN 5-500 MG TABS (GLYBURIDE-METFORMIN) two tablets twice daily  #360 x 3   Entered and Authorized by:   Syliva Overman MD   Signed by:   Syliva Overman MD on 08/22/2009   Method used:   Handwritten   RxID:   [id number]

## 2010-01-31 NOTE — Assessment & Plan Note (Signed)
Summary: 6 MO FU PER JAN REMINDER-SRS  Medications Added GLYBURIDE 2.5 MG TABS (GLYBURIDE) Take 1 tablet by mouth two times a day. In addition to glyburide/metformin        Visit Type:  Follow-up Referring Provider:  [name] Primary Provider:  Dr. Syliva Overman  CC:  atrial flutter.  History of Present Illness: Patient is seen for followup of atrial flutter.  She was cardioverted several years ago and has held sinus.  In November, 2009, she fell backwards in the bathroom bruising her buttocks.  Unfortunately she developed a major hematoma.  Over time this does not resolve completely and she continues to have symptoms. She is being assessed by Dr.Kramer and an MRI is to be done soon.  She's not having any palpitations.  Preventive Screening-Counseling & Management  Alcohol-Tobacco     Smoking Status: never  Current Medications (verified): 1)  Hydrocodone-Acetaminophen 10-500 Mg  Tabs (Hydrocodone-Acetaminophen) .... One Tab By Mouth Four Times Daily As Needed 2)  Bl Vitamin E 200 Unit  Caps (Vitamin E) .... One Cap By Mouth Once Daily 3)  Biotin 1000 Mcg  Tabs (Biotin) .... One Tab By Mouth Once Daily 4)  Diltiazem Hcl Cr 240 Mg  Cp24 (Diltiazem Hcl) .... One Cap By Mouth Once Daily 5)  Vitamin C 500 Mg  Tabs (Ascorbic Acid) .... One Tab By Mouth Once Daily 6)  Omeprazole 20 Mg  Tbec (Omeprazole) .... One Tab By Mouth Once Daily 7)  Fish Oil 1200 Mg  Caps (Omega-3 Fatty Acids) .... Two Caps By Mouth Two Times A Day 8)  Os-Cal Ultra 600 Mg  Tabs (Calcium Carb-Vit D-C-E-Mineral) .... One Tab By Mouth Two Times A Day 9)  Crestor 40 Mg Tabs (Rosuvastatin Calcium) .... One Tab By Mouth Qhs 10)  Celexa 40 Mg Tabs (Citalopram Hydrobromide) .... Take 1 Tablet By Mouth Once A Day 11)  Metanx 2.8-25-2 Mg Tabs (L-Methylfolate-B6-B12) .... Take 1 Tablet By Mouth Once A Day 12)  Niaspan 500 Mg Cr-Tabs (Niacin (Antihyperlipidemic)) .... Uad 13)  Tricor 145 Mg Tabs (Fenofibrate) .... One  Tab By Mouth Qhs 14)  Meloxicam 15 Mg Tabs (Meloxicam) .... One Tab By Mouth Qd 15)  Lotensin Hct 20-12.5 Mg Tabs (Benazepril-Hydrochlorothiazide) .... Take 1 Tablet By Mouth Two Times A Day 16)  Adult Aspirin Low Strength 81 Mg Tbdp (Aspirin) .... Once Daily 17)  Glyburide-Metformin 2.5-500 Mg Tabs (Glyburide-Metformin) .... 2 Tablets Twice Daily 18)  Veetids 500 Mg Tabs (Penicillin V Potassium) .... Take 1 Tablet By Mouth Three Times A Day 19)  Glyburide 2.5 Mg Tabs (Glyburide) .... Take 1 Tablet By Mouth Two Times A Day. in Addition To Glyburide/metformin  Allergies: 1)  ! Sulfa 2)  ! Prinivil 3)  ! Xanax 4)  ! Zoloft  Comments:  Nurse/Medical Assistant: The patient's medications were reviewed with the patient and were updated in the Medication List. Pt brought a list of medications to office visit.  [name] Loosen, RN, BSN (January 28, 2009 3:52 PM)  Past History:  Past Medical History:  Atrial flutter.... DC cardioversion.... successful.... February, 2008 /  holding sinus as of January, 2011 Coumadin therapy..... discontinued... severe hematoma secondary to a fall HYPERTENSION (ICD-401.9) HYPERLIPIDEMIA (ICD-272.4) HYPERCHOLESTEROLEMIA (ICD-272.0) MITRAL REGURGITATION, 0 (MILD) (ICD-396.3)....mild...echo... 2010 OBESITY, UNSPECIFIED (ICD-278.00) SLEEP APNEA (ICD-780.57) FATIGUE (ICD-780.79) DEPRESSION (ICD-311) ANXIETY (ICD-300.00) UNSPECIFIED VISUAL LOSS (ICD-369.9) CARCINOMA, BASAL CELL (ICD-173.9) HEMATOCHEZIA, HX OF (ICD-V12.79) HEMATOMA (ICD-924.9)...buttocks   secondary to fall   November 2009   while on Coumadin.Marland Kitchen  Coumadin stop DIABETES MELLITUS, TYPE II (ICD-250.00) BACK PAIN WITH RADICULOPATHY (ICD-729.2) ARTHRITIS (ICD-716.90) OSTEOARTHRITIS (ICD-715.90) ACUTE CYSTITIS (ICD-595.0) GERD (ICD-530.81) DIVERTICULOSIS, COLON (ICD-562.10) CONSTIPATION (ICD-564.00) IRRITABLE BOWEL SYNDROME (ICD-564.1) ABDOMINAL BLOATING (ICD-787.3) NAUSEA (ICD-787.02) EROSIVE  ESOPHAGITIS (ICD-530.19) OTHER SCREENING MAMMOGRAM (ICD-V76.12) Malignant melanoma twicwe EF 60%.. echo... July, 2010 Aortic valve sclerosis.... echo... July, 2010 Granulomatous disease..... right chest.... CT chest... November, 2009 Nuclear stress test... September, 2007... no ischemic  Vital Signs:  Patient profile:   77 year old female Menstrual status:  hysterectomy Height:      63 inches Weight:      183.50 pounds Pulse rate:   64 / minute BP sitting:   174 / 73  (left arm) Cuff size:   regular  Vitals Entered By: [name] Loosen, RN, BSN (January 28, 2009 3:41 PM) CC: atrial flutter Comments Follow up office visit.    Impression & Recommendations:  Problem # 1:  ATRIAL FLUTTER (ICD-427.32)  Her updated medication list for this problem includes:    Adult Aspirin Low Strength 81 Mg Tbdp (Aspirin) ..... Once daily  The patient was converted from atrial flutter in February, 2008 she is held sinus since then.  Coumadin was stopped in November, 2009 because of a hematoma from a fall.  She has not had recurrent palpitations.  No further workup of her arrhythmias needed.  Problem # 2:  HYPERTENSION (ICD-401.9)  Her updated medication list for this problem includes:    Diltiazem Hcl Cr 240 Mg Cp24 (Diltiazem hcl) ..... One cap by mouth once daily    Lotensin Hct 20-12.5 Mg Tabs (Benazepril-hydrochlorothiazide) .Marland Kitchen... Take 1 tablet by mouth two times a day    Adult Aspirin Low Strength 81 Mg Tbdp (Aspirin) ..... Once daily Systolic blood pressure is elevated today.  She will need blood pressure followup with her primary physician.  Problem # 3:  MITRAL REGURGITATION, 0 (MILD) (ICD-396.3) Mitral regurgitation is only very mild.  No further workup is needed.  Problem # 4:  HEMATOMA (ICD-924.9) Unfortunately she continues to have problems from her buttocks hematoma from several years ago.  This is being evaluated further.  We'll see her for cardiology followup in one  year.  Other Orders: EKG w/ Interpretation (93000)  Patient Instructions: 1)  Your physician recommends that you continue on your current medications as directed. Please refer to the Current Medication list given to you today. 2)  Your physician wants you to follow-up in: 1year. You will receive a reminder letter in the mail about two months in advance. If you don't receive a letter, please call our office to schedule the follow-up appointment.

## 2010-01-31 NOTE — Assessment & Plan Note (Signed)
Summary: FOLLOW UP   Vital Signs:  Patient profile:   77 year old female Menstrual status:  hysterectomy Height:      63 inches Weight:      172 pounds BMI:     30.58 O2 Sat:      96 % on Room air Pulse rate:   54 / minute Pulse rhythm:   regular Resp:     16 per minute BP sitting:   124 / 60  (left arm)  Vitals Entered By: Adella Hare LPN (October 04, 2009 2:12 PM)  Nutrition Counseling: Patient's BMI is greater than 25 and therefore counseled on weight management options.  O2 Flow:  Room air CC: FOLLOW UP-  Is Patient Diabetic? Yes Did you bring your meter with you today? No Comments COMPLAINS OF RIGHT HIP PAIN GOING DOWN RIGHT LEG   Primary Care Provider:  Dr. Syliva Overman  CC:  FOLLOW UP- .  History of Present Illness: Reports  thatshe has been doing fairly well, except for ongoing back pain.  Denies recent fever or chills. Denies sinus pressure, nasal congestion , ear pain or sore throat. Denies chest congestion, or cough productive of sputum. Denies chest pain, palpitations, PND, orthopnea or leg swelling. Denies abdominal pain, nausea, vomitting, diarrhea or constipation. Denies change in bowel movements or bloody stool. Denies dysuria , frequency, incontinence or hesitancy.  Denies headaches, vertigo, seizures. Denies depression, anxiety or insomnia. Denies  rash, lesions, or itch.     Current Medications (verified): 1)  Hydrocodone-Acetaminophen 10-500 Mg  Tabs (Hydrocodone-Acetaminophen) .... One Tab By Mouth Four Times Daily As Needed 2)  Bl Vitamin E 200 Unit  Caps (Vitamin E) .... One Cap By Mouth Once Daily 3)  Diltiazem Hcl Cr 240 Mg  Cp24 (Diltiazem Hcl) .... One Cap By Mouth Once Daily 4)  Vitamin C 500 Mg  Tabs (Ascorbic Acid) .... One Tab By Mouth Once Daily 5)  Omeprazole 20 Mg  Tbec (Omeprazole) .... One Tab By Mouth Once Daily 6)  Fish Oil 1200 Mg  Caps (Omega-3 Fatty Acids) .... Two Caps By Mouth Two Times A Day 7)  Os-Cal Ultra 600 Mg   Tabs (Calcium Carb-Vit D-C-E-Mineral) .... One Tab By Mouth Two Times A Day 8)  Crestor 40 Mg Tabs (Rosuvastatin Calcium) .... One Tab By Mouth Qhs 9)  Celexa 40 Mg Tabs (Citalopram Hydrobromide) .... Take 1 Tablet By Mouth Once A Day 10)  Metanx 2.8-25-2 Mg Tabs (L-Methylfolate-B6-B12) .... Take 1 Tablet By Mouth Once A Day 11)  Niaspan 500 Mg Cr-Tabs (Niacin (Antihyperlipidemic)) .... Uad 12)  Tricor 145 Mg Tabs (Fenofibrate) .... One Tab By Mouth Qhs 13)  Meloxicam 15 Mg Tabs (Meloxicam) .... One Tab By Mouth Qd 14)  Lotensin Hct 20-12.5 Mg Tabs (Benazepril-Hydrochlorothiazide) .... Take 1 Tablet By Mouth Two Times A Day 15)  Adult Aspirin Low Strength 81 Mg Tbdp (Aspirin) .... Once Daily 16)  Vitamin C 500 Mg Tabs (Ascorbic Acid) .... Take 1 Tablet By Mouth Once A Day 17)  Glucosamine-Chondroitin 250-500 Mg Caps (Glucosamine-Chondroitin) .... Take 1 Tablet By Mouth Three Times A Day 18)  Glyburide-Metformin 5-500 Mg Tabs (Glyburide-Metformin) .... Two Tablets Twice Daily  Allergies (verified): 1)  ! Sulfa 2)  ! Prinivil 3)  ! Xanax 4)  ! Zoloft  Past History:  Past Surgical History: Bilateral bunion removal (1984 and 1985) Knee replacement (2000 and 2002) both Tonsillectomy (0454) Appendectomy (0981) Dilation and curettage (1914 and 2001) Hysterectomy (2001)  Right Cataract  extraction (2001) Surgical excisions of skin cancers Left cataract extraction (2005) Basal cell cancer removed from right forearm and back of neck, left side  sept 2011  Review of Systems      See HPI General:  Complains of fatigue. Eyes:  Complains of vision loss-both eyes; denies discharge and red eye; wears corrective lenses. MS:  Complains of low back pain, mid back pain, muscle weakness, and stiffness. Derm:  Complains of lesion(s); repoprts pus pockets in areas of recent bx on right forearm, also cat scratch, states her dermatologist has sent in medication. Psych:  Denies mental problems, suicidal  thoughts/plans, thoughts of violence, and unusual visions or sounds. Endo:  Denies excessive thirst and excessive urination; tests regularly, fastiungs seldom over 120. Heme:  Complains of abnormal bruising; denies bleeding and enlarge lymph nodes. Allergy:  Denies hives or rash and itching eyes.  Physical Exam  General:  Well-developed,well-nourished,in no acute distress; alert,appropriate and cooperative throughout examination. HEENT: No facial asymmetry,  EOMI, No sinus tenderness, TM's Clear, oropharynx  pink and moist.   Chest: Clear to auscultation bilaterally.  CVS: S1, S2, No murmurs, No S3.   Abd: Soft, Nontender.  WJ:[id number]  ROM spine,adequate in  hips, shoulders and reduced in  knees.  Ext: No edema.   CNS: CN 2-12 intact, power tone and sensation normal throughout.   Skin: Intact, no visible lesions or rashes. Bruising. Recent bx xires covered Psych: Good eye contact, normal affect.  Memory intact, not anxious or depressed appearing.   Diabetes Management Exam:    Foot Exam (with socks and/or shoes not present):       Sensory-Monofilament:          Left foot: diminished          Right foot: diminished       Inspection:          Left foot: normal          Right foot: normal       Nails:          Left foot: thickened          Right foot: thickened   Impression & Recommendations:  Problem # 1:  HYPERTENSION (ICD-401.9) Assessment Improved  Her updated medication list for this problem includes:    Diltiazem Hcl Cr 240 Mg Cp24 (Diltiazem hcl) ..... One cap by mouth once daily    Lotensin Hct 20-12.5 Mg Tabs (Benazepril-hydrochlorothiazide) .Marland Kitchen... Take 1 tablet by mouth two times a day  BP today: 124/60 Prior BP: 154/70 (08/11/2009)  Labs Reviewed: K+: 5.5 (03/10/2009) Creat: : 0.81 (03/10/2009)   Chol: 173 (03/10/2009)   HDL: 72 (03/10/2009)   LDL: 72 (03/10/2009)   TG: 147 (03/10/2009)  Problem # 2:  HYPERCHOLESTEROLEMIA (ICD-272.0) Assessment: Comment  Only  Her updated medication list for this problem includes:    Crestor 40 Mg Tabs (Rosuvastatin calcium) ..... One tab by mouth qhs    Niaspan 500 Mg Cr-tabs (Niacin (antihyperlipidemic)) ..... Uad    Tricor 145 Mg Tabs (Fenofibrate) ..... One tab by mouth qhs  Labs Reviewed: SGOT: 22 (03/10/2009)   SGPT: 15 (03/10/2009)   HDL:72 (03/10/2009), 62 (09/02/2008)  LDL:72 (03/10/2009), 55 (09/02/2008)  Chol:173 (03/10/2009), 148 (09/02/2008)  Trig:147 (03/10/2009), 157 (09/02/2008)  Problem # 3:  BACK PAIN WITH RADICULOPATHY (ICD-729.2) Assessment: Unchanged  Problem # 4:  DIABETES MELLITUS, TYPE II (ICD-250.00) Assessment: Comment Only  Her updated medication list for this problem includes:    Lotensin Hct 20-12.5 Mg Tabs (Benazepril-hydrochlorothiazide) .Marland KitchenMarland KitchenMarland KitchenMarland Kitchen  Take 1 tablet by mouth two times a day    Adult Aspirin Low Strength 81 Mg Tbdp (Aspirin) ..... Once daily    Glyburide-metformin 5-500 Mg Tabs (Glyburide-metformin) .Marland Kitchen..Marland Kitchen Two tablets twice daily  Orders: T- Hemoglobin A1C ([id number])  Labs Reviewed: Creat: 0.81 (03/10/2009)    Reviewed HgBA1c results: 7.3 (05/20/2009)  7.5 (01/19/2009)  Complete Medication List: 1)  Hydrocodone-acetaminophen 10-500 Mg Tabs (Hydrocodone-acetaminophen) .... One tab by mouth four times daily as needed 2)  Bl Vitamin E 200 Unit Caps (Vitamin e) .... One cap by mouth once daily 3)  Diltiazem Hcl Cr 240 Mg Cp24 (Diltiazem hcl) .... One cap by mouth once daily 4)  Vitamin C 500 Mg Tabs (Ascorbic acid) .... One tab by mouth once daily 5)  Omeprazole 20 Mg Tbec (Omeprazole) .... One tab by mouth once daily 6)  Fish Oil 1200 Mg Caps (Omega-3 fatty acids) .... Two caps by mouth two times a day 7)  Os-cal Ultra 600 Mg Tabs (Calcium carb-vit d-c-e-mineral) .... One tab by mouth two times a day 8)  Crestor 40 Mg Tabs (Rosuvastatin calcium) .... One tab by mouth qhs 9)  Celexa 40 Mg Tabs (Citalopram hydrobromide) .... Take 1 tablet by mouth once a  day 10)  Metanx 2.8-25-2 Mg Tabs (L-methylfolate-b6-b12) .... Take 1 tablet by mouth once a day 11)  Niaspan 500 Mg Cr-tabs (Niacin (antihyperlipidemic)) .... Uad 12)  Tricor 145 Mg Tabs (Fenofibrate) .... One tab by mouth qhs 13)  Meloxicam 15 Mg Tabs (Meloxicam) .... One tab by mouth qd 14)  Lotensin Hct 20-12.5 Mg Tabs (Benazepril-hydrochlorothiazide) .... Take 1 tablet by mouth two times a day 15)  Adult Aspirin Low Strength 81 Mg Tbdp (Aspirin) .... Once daily 16)  Vitamin C 500 Mg Tabs (Ascorbic acid) .... Take 1 tablet by mouth once a day 17)  Glucosamine-chondroitin 250-500 Mg Caps (Glucosamine-chondroitin) .... Take 1 tablet by mouth three times a day 18)  Glyburide-metformin 5-500 Mg Tabs (Glyburide-metformin) .... Two tablets twice daily  Other Orders: Influenza Vaccine MCR (41324) Tdap => 77yrs IM (40102) Admin 1st Vaccine (72536) T-Basic Metabolic Panel [phone]) T-Lipid Profile [phone]) T-Hepatic Function [phone])  Patient Instructions: 1)  Please schedule a follow-up appointment in 4 months. 2)  It is important that you exercise regularly at least 20 minutes 5 times a week. If you develop chest pain, have severe difficulty breathing, or feel very tired , stop exercising immediately and seek medical attention. 3)  You need to lose weight. Consider a lower calorie diet and regular exercise. Congrats on 15 pound weight loss since Jan, pls keep it up!! 4)  BMP prior to visit, ICD-9: 5)  Hepatic Panel prior to visit, ICD-9: 6)  Lipid Panel prior to visit, ICD-9:   fasting in 4 months 7)  HbgA1C prior to visit, ICD-9: 8)  Blood sugar is much better, and at goal. 9)  Pls reduuce the fatty foods in your diet. 10)  I am happy that you are doing better. 11)  TDAp and flu vac today 12)  The medication list was reviewed and reconciled..All changed/newly prescribed medications were explained. A complete medication list was provided to the patient/caregiver.   Prescriptions: HYDROCODONE-ACETAMINOPHEN 10-500 MG  TABS (HYDROCODONE-ACETAMINOPHEN) one tab by mouth four times daily as needed  #120 x 3   Entered by:   Adella Hare LPN   Authorized by:   Syliva Overman MD   Signed by:   Adella Hare LPN on 60/63/0160   Method used:  Printed then faxed to ...       [facility]* (retail)       [address]       [place], [place]  [address]       Ph: [phone]       Fax: [phone]   RxID:   [id number] MELOXICAM 15 MG TABS (MELOXICAM) one tab by mouth qd  #30 x 3   Entered by:   Adella Hare LPN   Authorized by:   Syliva Overman MD   Signed by:   Adella Hare LPN on 25/36/6440   Method used:   Electronically to        [facility]* (retail)       [address]       [place], [place]  [address]       Ph: [phone]       Fax: [phone]   RxID:   [id number] TRICOR 145 MG TABS (FENOFIBRATE) one tab by mouth qhs  #30 x 3   Entered by:   Adella Hare LPN   Authorized by:   Syliva Overman MD   Signed by:   Adella Hare LPN on 57/32/2025   Method used:   Electronically to        [facility]* (retail)       [address]       [place], [place]  [address]       Ph: [phone]       Fax: [phone]   RxID:   [id number] METANX 2.8-25-2 MG TABS (L-METHYLFOLATE-B6-B12) Take 1 tablet by mouth once a day  #30 x 3   Entered by:   Adella Hare LPN   Authorized by:   Syliva Overman MD   Signed by:   Adella Hare LPN on 93/81/8299   Method used:   Electronically to        [facility]* (retail)       [address]       [place], [place]  [address]       Ph: [phone]       Fax: [phone]   RxID:   [id number]    Immunizations Administered:  Influenza Vaccine # 1:    Vaccine Type: Fluvax MCR    Site: right deltoid    Mfr: [email or web address]    Dose: 0.5 ml    Route: IM    Given by: Adella Hare LPN    Exp. Date: 05/2010    Lot #: [id number]    VIS  given: 07/26/09 version given October 04, 2009.  Tetanus Vaccine:    Vaccine Type: Tdap    Site: right deltoid    Mfr: [email or web address]    Dose: 0.5 ml    Route: IM    Given by: Adella Hare LPN    Exp. Date: 05/2010    Lot #: [id number]    VIS given: 11/19/07 version given October 04, 2009.

## 2010-01-31 NOTE — Medication Information (Signed)
Summary: Tax adviser   Imported By: Lind Guest 04/20/2009 12:57:11  _____________________________________________________________________  External Attachment:    Type:   Image     Comment:   External Document

## 2010-02-02 NOTE — Assessment & Plan Note (Signed)
Summary: right leg pain and back   Vital Signs:  Patient profile:   77 year old female Menstrual status:  hysterectomy Height:      63 inches Weight:      176.25 pounds BMI:     31.33 O2 Sat:      96 % on Room air Pulse rate:   63 / minute Pulse rhythm:   regular Resp:     16 per minute BP sitting:   144 / 60  (left arm)  Vitals Entered By: Adella Hare LPN (December 07, 2009 2:56 PM)  Nutrition Counseling: Patient's BMI is greater than 25 and therefore counseled on weight management options.  O2 Flow:  Room air CC: right leg pain Is Patient Diabetic? Yes Did you bring your meter with you today? No Comments did not bring meds to ov   Primary Care Provider:  Dr. Syliva Overman  CC:  right leg pain.  History of Present Illness: Increased back and leg pain in the past 1 month. Pt reports someone has been taking her pain meds, but has not confronted the person involved. She ahs been made aware that she needs to reduce the frequency of what she has  so the meds lAST , she agrees. Reports  that she is otherwise doing well. Denies recent fever or chills. Denies sinus pressure, nasal congestion , ear pain or sore throat. Denies chest congestion, or cough productive of sputum. Denies chest pain, palpitations, PND, orthopnea or leg swelling. Denies abdominal pain, nausea, vomitting, diarrhea or constipation. Denies change in bowel movements or bloody stool. Denies dysuria , frequency, incontinence or hesitancy.  Denies headaches, vertigo, seizures. Denies depression, anxiety or insomnia. Denies  rash, lesions, or itch.     Allergies (verified): 1)  ! Sulfa 2)  ! Prinivil 3)  ! Xanax 4)  ! Zoloft  Review of Systems      See HPI General:  Complains of fatigue. Eyes:  Denies blurring and discharge. MS:  Complains of low back pain and mid back pain. Endo:  Denies cold intolerance, excessive hunger, excessive thirst, and excessive urination. Heme:  Complains of  abnormal bruising; denies bleeding. Allergy:  Denies hives or rash and itching eyes.  Physical Exam  General:  Well-developed,obese,in no acute distress; alert,appropriate and cooperative throughout examination.Pt in pain HEENT: No facial asymmetry,  EOMI, No sinus tenderness, TM's Clear, oropharynx  pink and moist.   Chest: Clear to auscultation bilaterally.  CVS: S1, S2, No murmurs, No S3.   Abd: Soft, Nontender.  MS: decreased  ROM spine,adequate in hips, shoulders and knees.  Ext: No edema.   CNS: CN 2-12 intact, power tone and sensation normal throughout.   Skin: Intact, no visible lesions or rashes.  Psych: Good eye contact, normal affect.  Memory intact, not anxious or depressed appearing.    Impression & Recommendations:  Problem # 1:  HYPERTENSION (ICD-401.9) Assessment Deteriorated  Her updated medication list for this problem includes:    Diltiazem Hcl Cr 240 Mg Cp24 (Diltiazem hcl) ..... One cap by mouth once daily    Lotensin Hct 20-12.5 Mg Tabs (Benazepril-hydrochlorothiazide) .Marland Kitchen... Take 1 tablet by mouth two times a day Patient advised to follow low sodium diet rich in fruit and vegetables, and to commit to at least 30 minutes 5 days per week of regular exercise , to improve blood presure control.   BP today: 144/60 Prior BP: 124/60 (10/04/2009)  Labs Reviewed: K+: 5.0 (10/03/2009) Creat: : 1.10 (10/03/2009)  Chol: 162 (10/03/2009)   HDL: 67 (10/03/2009)   LDL: 64 (10/03/2009)   TG: 155 (10/03/2009)  Problem # 2:  DEPRESSION (ICD-311) Assessment: Improved  Her updated medication list for this problem includes:    Citalopram Hydrobromide 40 Mg Tabs (Citalopram hydrobromide) .Marland Kitchen... Take 1 tablet by mouth once a day  Problem # 3:  HYPERLIPIDEMIA (ICD-272.4) Assessment: Improved  Her updated medication list for this problem includes:    Crestor 40 Mg Tabs (Rosuvastatin calcium) ..... One tab by mouth qhs    Niaspan 500 Mg Cr-tabs (Niacin (antihyperlipidemic))  ..... Uad    Tricor 145 Mg Tabs (Fenofibrate) ..... One tab by mouth qhs  Labs Reviewed: SGOT: 18 (10/03/2009)   SGPT: 14 (10/03/2009)   HDL:67 (10/03/2009), 72 (03/10/2009)  LDL:64 (10/03/2009), 72 (03/10/2009)  Chol:162 (10/03/2009), 173 (03/10/2009)  Trig:155 (10/03/2009), 147 (03/10/2009)  Problem # 4:  BACK PAIN WITH RADICULOPATHY (ICD-729.2) Assessment: Deteriorated  Orders: Medicare Electronic Prescription [phone]) Depo- Medrol 80mg  (J1040) Ketorolac-Toradol 15mg  (W0981) Admin of Therapeutic Inj  intramuscular or subcutaneous ([address])  Problem # 5:  DIABETES MELLITUS, TYPE II (ICD-250.00) Assessment: Improved  Her updated medication list for this problem includes:    Lotensin Hct 20-12.5 Mg Tabs (Benazepril-hydrochlorothiazide) .Marland Kitchen... Take 1 tablet by mouth two times a day    Adult Aspirin Low Strength 81 Mg Tbdp (Aspirin) ..... Once daily    Glyburide-metformin 5-500 Mg Tabs (Glyburide-metformin) .Marland Kitchen..Marland Kitchen Two tablets twice daily  Labs Reviewed: Creat: 1.10 (10/03/2009)    Reviewed HgBA1c results: 6.9 (10/03/2009)  7.3 (05/20/2009)  Complete Medication List: 1)  Hydrocodone-acetaminophen 10-500 Mg Tabs (Hydrocodone-acetaminophen) .... One tab by mouth four times daily as needed 2)  Bl Vitamin E 200 Unit Caps (Vitamin e) .... One cap by mouth once daily 3)  Diltiazem Hcl Cr 240 Mg Cp24 (Diltiazem hcl) .... One cap by mouth once daily 4)  Vitamin C 500 Mg Tabs (Ascorbic acid) .... One tab by mouth once daily 5)  Omeprazole 20 Mg Tbec (Omeprazole) .... One tab by mouth once daily 6)  Fish Oil 1200 Mg Caps (Omega-3 fatty acids) .... Two caps by mouth two times a day 7)  Os-cal Ultra 600 Mg Tabs (Calcium carb-vit d-c-e-mineral) .... One tab by mouth two times a day 8)  Crestor 40 Mg Tabs (Rosuvastatin calcium) .... One tab by mouth qhs 9)  Metanx 2.8-25-2 Mg Tabs (L-methylfolate-b6-b12) .... Take 1 tablet by mouth once a day 10)  Niaspan 500 Mg Cr-tabs (Niacin (antihyperlipidemic))  .... Uad 11)  Tricor 145 Mg Tabs (Fenofibrate) .... One tab by mouth qhs 12)  Meloxicam 15 Mg Tabs (Meloxicam) .... One tab by mouth qd 13)  Lotensin Hct 20-12.5 Mg Tabs (Benazepril-hydrochlorothiazide) .... Take 1 tablet by mouth two times a day 14)  Adult Aspirin Low Strength 81 Mg Tbdp (Aspirin) .... Once daily 15)  Glucosamine-chondroitin 250-500 Mg Caps (Glucosamine-chondroitin) .... Take 1 tablet by mouth three times a day 16)  Glyburide-metformin 5-500 Mg Tabs (Glyburide-metformin) .... Two tablets twice daily 17)  Citalopram Hydrobromide 40 Mg Tabs (Citalopram hydrobromide) .... Take 1 tablet by mouth once a day  Patient Instructions: 1)  Please schedule a follow-up appointment in 3.5 months. 2)  It is important that you exercise regularly at least 20 minutes 5 times a week. If you develop chest pain, have severe difficulty breathing, or feel very tired , stop exercising immediately and seek medical attention. 3)  You need to lose weight. Consider a lower calorie diet and regular exercise.  4)  HbgA1C prior to visit, ICD-9: 5)  BMP prior to visit, ICD-9: IN 3.5 MONTHS 6)  YOU WLL GET INJECTIONS IN THE OFFICE TODAY FOR BACK PAIN, AND MEDS ARE ALSO SENT IN 7)  nO EARLY FILLS ON THE HYDROCODONE, YOU NEED TO KEEP IT SAFE. Prescriptions: PREDNISONE (PAK) 5 MG TABS (PREDNISONE) Use as directed  #21 x 0   Entered and Authorized by:   Syliva Overman MD   Signed by:   Syliva Overman MD on 12/07/2009   Method used:   Electronically to        [facility]* (retail)       [address]       [place], [place]  [address]       Ph: [phone]       Fax: [phone]   RxID:   [id number]    Medication Administration  Injection # 1:    Medication: Depo- Medrol 80mg     Diagnosis: BACK PAIN WITH RADICULOPATHY (ICD-729.2)    Route: IM    Site: RUOQ gluteus    Exp Date: 06/12    Lot #: [id number]    Mfr: Pharmacia    Patient tolerated injection without complications     Given by: Adella Hare LPN (December 07, 2009 4:50 PM)  Injection # 2:    Medication: Ketorolac-Toradol 15mg     Diagnosis: BACK PAIN WITH RADICULOPATHY (ICD-729.2)    Route: IM    Site: LUOQ gluteus    Exp Date: 06/02/2011    Lot #: [id number]    Mfr: [email or web address]    Comments: toradol 60mg  given    Patient tolerated injection without complications    Given by: Adella Hare LPN (December 07, 2009 4:51 PM)  Orders Added: 1)  Est. Patient Level IV [01027] 2)  Medicare Electronic Prescription [G8553] 3)  Depo- Medrol 80mg  [J1040] 4)  Ketorolac-Toradol 15mg  [J1885] 5)  Admin of Therapeutic Inj  intramuscular or subcutaneous [96372]     Medication Administration  Injection # 1:    Medication: Depo- Medrol 80mg     Diagnosis: BACK PAIN WITH RADICULOPATHY (ICD-729.2)    Route: IM    Site: RUOQ gluteus    Exp Date: 06/12    Lot #: [id number]    Mfr: Pharmacia    Patient tolerated injection without complications    Given by: Adella Hare LPN (December 07, 2009 4:50 PM)  Injection # 2:    Medication: Ketorolac-Toradol 15mg     Diagnosis: BACK PAIN WITH RADICULOPATHY (ICD-729.2)    Route: IM    Site: LUOQ gluteus    Exp Date: 06/02/2011    Lot #: [id number]    Mfr: [email or web address]    Comments: toradol 60mg  given    Patient tolerated injection without complications    Given by: Adella Hare LPN (December 07, 2009 4:51 PM)  Orders Added: 1)  Est. Patient Level IV [03474] 2)  Medicare Electronic Prescription [G8553] 3)  Depo- Medrol 80mg  [J1040] 4)  Ketorolac-Toradol 15mg  [J1885] 5)  Admin of Therapeutic Inj  intramuscular or subcutaneous [25956]

## 2010-02-03 ENCOUNTER — Encounter: Payer: Self-pay | Admitting: Family Medicine

## 2010-02-13 ENCOUNTER — Telehealth: Payer: Self-pay | Admitting: Family Medicine

## 2010-02-14 ENCOUNTER — Encounter: Payer: Self-pay | Admitting: Family Medicine

## 2010-02-16 ENCOUNTER — Telehealth: Payer: Self-pay | Admitting: Family Medicine

## 2010-02-16 NOTE — Letter (Signed)
[facility]: [facility]   Imported By: Lind Guest 02/09/2010 14:15:55  _____________________________________________________________________  External Attachment:    Type:   Image     Comment:   External Document

## 2010-02-17 ENCOUNTER — Ambulatory Visit: Payer: Self-pay | Admitting: Cardiology

## 2010-02-22 NOTE — Letter (Signed)
Summary: [facility]  [facility]   Imported By: Lind Guest 02/14/2010 17:11:44  _____________________________________________________________________  External Attachment:    Type:   Image     Comment:   External Document

## 2010-02-22 NOTE — Progress Notes (Signed)
Summary: medicine  Phone Note Call from Patient   Summary of Call: [facility] did not recieve the fax for her metanx please send in for her  Initial call taken by: Lind Guest,  February 16, 2010 4:01 PM    Prescriptions: METANX 2.8-25-2 MG TABS (L-METHYLFOLATE-B6-B12) Take 1 tablet by mouth once a day  #30 x 3   Entered by:   Everitt Amber LPN   Authorized by:   Syliva Overman MD   Signed by:   Everitt Amber LPN on 04/54/0981   Method used:   Electronically to        [facility]* (retail)       [address]       [place], [place]  [address]       Ph: [phone]       Fax: [phone]   RxID:   [id number]

## 2010-02-22 NOTE — Progress Notes (Signed)
Summary: new order  Phone Note Call from Patient   Summary of Call: [employer] update for her supplies on diabetic needs new order send to health care # [id number] Initial call taken by: Lind Guest,  February 13, 2010 2:46 PM  Follow-up for Phone Call        we need a faxed request and request from patient to complete this Follow-up by: Adella Hare LPN,  February 13, 2010 4:33 PM

## 2010-03-13 ENCOUNTER — Telehealth: Payer: Self-pay | Admitting: Family Medicine

## 2010-03-13 ENCOUNTER — Encounter: Payer: Self-pay | Admitting: *Deleted

## 2010-03-17 ENCOUNTER — Telehealth: Payer: Self-pay | Admitting: Family Medicine

## 2010-03-17 ENCOUNTER — Encounter: Payer: Self-pay | Admitting: Family Medicine

## 2010-03-21 NOTE — Letter (Signed)
Summary: [facility]  [facility]APE[facility]OES   Imported By: Lind Guest 03/17/2010 08:02:29  _____________________________________________________________________  External Attachment:    Type:   Image     Comment:   External Document

## 2010-03-21 NOTE — Progress Notes (Signed)
Summary: diabetic shoes  Phone Note Call from Patient   Summary of Call: pt needs for dr. Lodema Hong write her a rx for diabetic shoes. [phone]  [phone] Initial call taken by: Rudene Anda,  March 13, 2010 10:05 AM  Follow-up for Phone Call        script written pls let her know and send OV from 10/2009 Follow-up by: Syliva Overman MD,  March 13, 2010 5:05 PM  Additional Follow-up for Phone Call Additional follow up Details #1::        called patient, left message Additional Follow-up by: Adella Hare LPN,  March 14, 2010 2:01 PM    Additional Follow-up for Phone Call Additional follow up Details #2::    wants sent to [facility] in [facility] Follow-up by: Adella Hare LPN,  March 15, 2010 9:14 AM

## 2010-03-30 NOTE — Progress Notes (Signed)
  Phone Note From Pharmacy   Caller: [facility]* Summary of Call: requesting fluticasone spr 2 puffs by mouth once daily no longer on med list Initial call taken by: Adella Hare LPN,  March 17, 2010 3:41 PM  Follow-up for Phone Call        please notify the patient and the pharmacy of the medication The new script is entered historically, please send after speaking with the patient.  Follow-up by: Syliva Overman MD,  March 18, 2010 11:31 PM    New/Updated Medications: FLUTICASONE PROPIONATE 50 MCG/ACT SUSP (FLUTICASONE PROPIONATE) one to two puffs per nostril once daily Prescriptions: FLUTICASONE PROPIONATE 50 MCG/ACT SUSP (FLUTICASONE PROPIONATE) one to two puffs per nostril once daily  #1 x 3   Entered by:   Everitt Amber LPN   Authorized by:   Syliva Overman MD   Signed by:   Everitt Amber LPN on 02/72/5366   Method used:   Electronically to        [facility]* (retail)       [address]       [place], [place]  [address]       Ph: [phone]       Fax: [phone]   RxID:   [id number] FLUTICASONE PROPIONATE 50 MCG/ACT SUSP (FLUTICASONE PROPIONATE) one to two puffs per nostril once daily  #1 x 3   Entered and Authorized by:   Syliva Overman MD   Signed by:   Syliva Overman MD on 03/18/2010   Method used:   Historical   RxID:   [id number]

## 2010-04-01 ENCOUNTER — Other Ambulatory Visit: Payer: Self-pay | Admitting: Family Medicine

## 2010-04-01 LAB — LIPID PANEL: LDL Cholesterol: 70 mg/dL (ref 0–99)

## 2010-04-01 LAB — HEPATIC FUNCTION PANEL
AST: 17 U/L (ref 0–37)
Alkaline Phosphatase: 36 U/L — ABNORMAL LOW (ref 39–117)
Bilirubin, Direct: 0.1 mg/dL (ref 0.0–0.3)
Indirect Bilirubin: 0.3 mg/dL (ref 0.0–0.9)
Total Bilirubin: 0.4 mg/dL (ref 0.3–1.2)

## 2010-04-01 LAB — BASIC METABOLIC PANEL
BUN: 21 mg/dL (ref 6–23)
CO2: 27 mEq/L (ref 19–32)
Calcium: 9.2 mg/dL (ref 8.4–10.5)
Chloride: 104 mEq/L (ref 96–112)
Creat: 0.78 mg/dL (ref 0.40–1.20)

## 2010-04-04 ENCOUNTER — Other Ambulatory Visit: Payer: Self-pay | Admitting: Family Medicine

## 2010-04-05 ENCOUNTER — Encounter: Payer: Self-pay | Admitting: Family Medicine

## 2010-04-09 ENCOUNTER — Encounter: Payer: Self-pay | Admitting: Cardiology

## 2010-04-09 DIAGNOSIS — I358 Other nonrheumatic aortic valve disorders: Secondary | ICD-10-CM | POA: Insufficient documentation

## 2010-04-09 DIAGNOSIS — Z7901 Long term (current) use of anticoagulants: Secondary | ICD-10-CM | POA: Insufficient documentation

## 2010-04-09 DIAGNOSIS — IMO0001 Reserved for inherently not codable concepts without codable children: Secondary | ICD-10-CM | POA: Insufficient documentation

## 2010-04-09 DIAGNOSIS — E782 Mixed hyperlipidemia: Secondary | ICD-10-CM | POA: Insufficient documentation

## 2010-04-09 DIAGNOSIS — I10 Essential (primary) hypertension: Secondary | ICD-10-CM | POA: Insufficient documentation

## 2010-04-10 ENCOUNTER — Ambulatory Visit: Payer: Self-pay | Admitting: Cardiology

## 2010-04-10 ENCOUNTER — Encounter: Payer: Self-pay | Admitting: Cardiology

## 2010-04-10 ENCOUNTER — Ambulatory Visit (INDEPENDENT_AMBULATORY_CARE_PROVIDER_SITE_OTHER): Payer: Medicare Other | Admitting: Family Medicine

## 2010-04-10 ENCOUNTER — Encounter: Payer: Self-pay | Admitting: Family Medicine

## 2010-04-10 ENCOUNTER — Ambulatory Visit (INDEPENDENT_AMBULATORY_CARE_PROVIDER_SITE_OTHER): Payer: Medicare Other | Admitting: Cardiology

## 2010-04-10 ENCOUNTER — Other Ambulatory Visit: Payer: Self-pay | Admitting: Cardiology

## 2010-04-10 VITALS — BP 162/70 | HR 70 | Resp 16 | Ht 62.0 in | Wt 183.0 lb

## 2010-04-10 DIAGNOSIS — R609 Edema, unspecified: Secondary | ICD-10-CM | POA: Insufficient documentation

## 2010-04-10 DIAGNOSIS — IMO0002 Reserved for concepts with insufficient information to code with codable children: Secondary | ICD-10-CM

## 2010-04-10 DIAGNOSIS — E669 Obesity, unspecified: Secondary | ICD-10-CM

## 2010-04-10 DIAGNOSIS — E119 Type 2 diabetes mellitus without complications: Secondary | ICD-10-CM

## 2010-04-10 DIAGNOSIS — I10 Essential (primary) hypertension: Secondary | ICD-10-CM

## 2010-04-10 DIAGNOSIS — I358 Other nonrheumatic aortic valve disorders: Secondary | ICD-10-CM

## 2010-04-10 DIAGNOSIS — I4892 Unspecified atrial flutter: Secondary | ICD-10-CM

## 2010-04-10 DIAGNOSIS — I359 Nonrheumatic aortic valve disorder, unspecified: Secondary | ICD-10-CM

## 2010-04-10 DIAGNOSIS — E1169 Type 2 diabetes mellitus with other specified complication: Secondary | ICD-10-CM

## 2010-04-10 DIAGNOSIS — F329 Major depressive disorder, single episode, unspecified: Secondary | ICD-10-CM

## 2010-04-10 DIAGNOSIS — E785 Hyperlipidemia, unspecified: Secondary | ICD-10-CM

## 2010-04-10 DIAGNOSIS — M549 Dorsalgia, unspecified: Secondary | ICD-10-CM

## 2010-04-10 MED ORDER — BENAZEPRIL-HYDROCHLOROTHIAZIDE 20-12.5 MG PO TABS: 2.0000 | ORAL_TABLET | Freq: Every day | ORAL | Status: DC

## 2010-04-10 MED ORDER — DILTIAZEM HCL ER COATED BEADS 240 MG PO CP24: 240.0000 mg | ORAL_CAPSULE | Freq: Every day | ORAL | Status: DC

## 2010-04-10 MED ORDER — GLYBURIDE-METFORMIN 2.5-500 MG PO TABS: 2.0000 | ORAL_TABLET | Freq: Two times a day (BID) | ORAL | Status: DC

## 2010-04-10 MED ORDER — KETOROLAC TROMETHAMINE 60 MG/2ML IM SOLN
60.0000 mg | Freq: Once | INTRAMUSCULAR | Status: AC
  Administered 2010-04-10: 60 mg via INTRAMUSCULAR

## 2010-04-10 NOTE — Patient Instructions (Addendum)
F/u in  3 months.  Your blood pressure today is high today, if this perssits , then you will need a higher dose of diltiazem when you return. Pls keep your cardiology appt in May.  Your cholesterol is excellent.  Your blood sugar is up slightly, which I agree is likely due to the gabapentin. I do believe that the gabapentin is the cause of the leg swelling also, use the lowest dose you can tolerate  HBA1C in 3 months. pls be more careful wiht your diet.  You will get an injection today for pain. Pls call for your eye exam

## 2010-04-10 NOTE — Assessment & Plan Note (Signed)
EKG today shows sinus rhythm.  I feel that her edema is not related to the return of any persistent supraventricular tachycardia.

## 2010-04-10 NOTE — Patient Instructions (Signed)
Your physician has requested that you have an echocardiogram. Echocardiography is a painless test that uses sound waves to create images of your heart. It provides your doctor with information about the size and shape of your heart and how well your heart's chambers and valves are working. This procedure takes approximately one hour. There are no restrictions for this procedure. Your physician recommends that you continue on your current medications as directed. Please refer to the Current Medication list given to you today. Follow up as scheduled.

## 2010-04-10 NOTE — Progress Notes (Signed)
HPI Patient is seen for overall cardiology followup.  She has a history of atrial flutter.  In addition she seen for edema that appears to be new fairly recently.  She has been on Neurontin .  She's been on a higher dose and she thinks the swelling may be from this.  She has mild shortness of breath at times.  She does not have PND or orthopnea.  He edema improves at nighttime but does not disappear completely.  She's not having any chest pain.  She's not had any more palpitations, syncope, or presyncope.   Allergies  Allergen Reactions  . Alprazolam   . Lisinopril   . Sertraline Hcl     REACTION: diarrhea  . Sulfonamide Derivatives     Current Outpatient Prescriptions  Medication Sig Dispense Refill  . Ascorbic Acid (VITAMIN C) 500 MG tablet Take 500 mg by mouth daily.        Marland Kitchen aspirin 325 MG tablet Take 325 mg by mouth daily.        . benazepril-hydrochlorthiazide (LOTENSIN HCT) 20-12.5 MG per tablet Take 1 tablet by mouth 2 (two) times daily.        . calcium carbonate (OS-CAL) 600 MG TABS Take 600 mg by mouth 2 (two) times daily with a meal.        . citalopram (CELEXA) 40 MG tablet Take 40 mg by mouth daily.        Marland Kitchen diltiazem (CARDIZEM CD) 240 MG 24 hr capsule Take 240 mg by mouth daily.        . fenofibrate (TRICOR) 145 MG tablet Take 145 mg by mouth at bedtime.        . fish oil-omega-3 fatty acids 1000 MG capsule Take 2 g by mouth 2 (two) times daily. 2 caps bid        . glucosamine-chondroitin 500-400 MG tablet Take 1 tablet by mouth 3 (three) times daily.        Marland Kitchen glyBURIDE-metformin (GLUCOVANCE) 5-500 MG per tablet Take 1 tablet by mouth 2 (two) times daily. 2 tabs bid        . HYDROcodone-acetaminophen (LORTAB) 10-500 MG per tablet TAKE ONE TABLET BY MOUTH FOUR TIMES DAILY AS NEEDED  120 tablet  0  . L-Methylfolate-B6-B12 (METANX) 2.8-25-2 MG TABS Take 1 tablet by mouth daily.        . meloxicam (MOBIC) 15 MG tablet Take 15 mg by mouth daily.        . niacin (NIASPAN) 500  MG CR tablet Take 500 mg by mouth as directed.        Marland Kitchen omeprazole (PRILOSEC) 20 MG capsule Take 20 mg by mouth daily.        . rosuvastatin (CRESTOR) 40 MG tablet Take 40 mg by mouth daily.        . vitamin E 200 UNIT capsule Take 200 Units by mouth daily.          History   Social History  . Marital Status: Married    Spouse Name: N/A    Number of Children: N/A  . Years of Education: N/A   Occupational History  . retired    Social History Main Topics  . Smoking status: Never Smoker   . Smokeless tobacco: Not on file  . Alcohol Use: No  . Drug Use: No  . Sexually Active: Not on file   Other Topics Concern  . Not on file   Social History Narrative  . No narrative on file  Family History  Problem Relation Age of Onset  . Dementia Mother   . Heart disease Father     Past Medical History  Diagnosis Date  . Atrial flutter febuary 2008       atrial flutter... DC Cardioversion...successful... holding sinus as of january 2011 coumadin therapy...discontinued...severe hematoma secondary to fall  . Hypertension   . Hyperlipidemia   . Hypercholesterolemia   . Mitral regurgitation 2010    mild ...echo...2010  . Obesity   . Sleep apnea   . Fatigue   . Depression with anxiety   . Visual loss     unspecified  . Basal cell carcinoma   . Hematochezia   . Hematoma     ...buttocks secondary to fall november 2009 with coumadin  coumadin stopped  . Diabetes mellitus     type 2  . Back pain     with radiculopathy  . Arthritis   . Osteoarthritis   . Acute cystitis   . GERD (gastroesophageal reflux disease)   . Diverticulosis of colon   . Constipation   . IBS (irritable bowel syndrome)   . Abdominal bloating   . Nausea   . Erosive esophagitis   . Aortic valve sclerosis     Ef 60 %...echo...july 2010   . Chronic granulomatous disease     right chest ... ct chest... novmber 2009  . Normal nuclear stress test     september ,2007...no ischemia  . Anticoagulant  long-term use     Coumadin  stopped...severe hematoma  from fall  . Edema     Painful, April, 2012    Past Surgical History  Procedure Date  . Bilateral bunion removel [id number]  . Bilaterial knee replacement     2000,2001  . Tonsillectomy 1957  . Appendectomy 1948  . Dilation and curettage of uterus 1952 and 2001  . Abdominal hysterectomy 2001  . Cataract extraction 2001    right   . Skin cancer excision   . Cataract extraction     left 2005  . Basal cell carcinoma excision 09/2009    right forarm and back of neck and left side     ROS   Patient denies fever, chills, headache, sweats, rash, change in vision, change in hearing, chest pain, cough, nausea vomiting, urinary symptoms.  All other systems are reviewed and are negative.  PHYSICAL EXAM Patient is mildly uncomfortable today with discomfort in her swollen feet.  She is oriented to person time and place.  Affect is normal.  Head is atraumatic.  There is no xanthelasma.  Lungs are clear.  Respiratory effort is nonlabored.  Cardiac exam reveals S1-S2.  There is a soft systolic murmur.  The abdomen is soft.  Patient has 1+ bilateral peripheral edema.  There is some mild redness at both of her ankles.  There are no musculoskeletal deformities.   Filed Vitals:   04/10/10 0918 04/10/10 0919  BP:  165/70  Pulse:  68  Height: 5\' 3"  (1.6 m)   Weight: 182 lb (82.555 kg)     EKG EKG is done today and reviewed by me.  There is normal sinus rhythm.  There is no significant EKG change.  ASSESSMENT & PLAN

## 2010-04-10 NOTE — Assessment & Plan Note (Signed)
The patient is bothered by her edema.  There is slight redness and some discomfort bilaterally.  Etiology is not clear.  She thinks that it may be related to the Neurontin.  There does not appear to be evidence of congestive heart failure.  She has known mild valvular disease.  There is a history of good left ventricular function.  She will be seeing Dr. Lodema Hong later today.  If it is felt that the Neurontin is the culprit and I would certainly agree with changing this medicine.  However if it is not out that Neurontin is the culprit I certainly agree that a small dose of Lasix added to her medications with followup would be appropriate.  Consideration also could be given to checking her renal function.  I will arrange for followup 2-D echo to be sure that there has not been a significant change and I will see her in followup.

## 2010-04-10 NOTE — Assessment & Plan Note (Signed)
Systolic blood pressure is elevated today.  Diuresis may help with this.

## 2010-04-10 NOTE — Progress Notes (Signed)
  Subjective:    Patient ID: [name] [name], female    DOB: [birth year], 77 y.o.   MRN: [id number]  HPI  Pt seen earlier today by cardiology, her ekg reportedly did not appear abnormal,she does have atrial fib, and she has an echo upcoming this week.  Was in the ed on 03/21/2010, following a fall at work, reported as a contusion, she was seen at [facility].Thinks she lost her breath causing the fall  Reports that around mid March, she has noted increase leg swelling, her cardiologist is evaluating this.  States Dr. Phoebe Perch she has resumed gabapentin early Feb , titrated up to 600mg  , and thinks this is the reason for her leg swelling, feels she may be able to tolerate 300mg     Reports increased , chronic and uncontrolled lower extremity pain , which prevents her from standing for over 5 mins, rates the pain at a 7 Review of Systems Denies recent fever or chills. Denies sinus pressure, nasal congestion, ear pain or sore throat. Denies chest congestion, productive cough or wheezing. Denies chest pains, palpitations, paroxysmal nocturnal dyspnea, orthopnea and leg swelling Denies abdominal pain, nausea, vomiting,diarrhea or constipation.  Denies rectal bleeding or change in bowel movement. Denies dysuria, frequency, hesitancy or incontinence.  Denies headaches, seizure, numbness, or tingling. Denies depression, anxiety or insomnia. Denies skin break down or rash. Pt denies hyper or hypoglycemic symptoms, though states her sugar has increased recently whic she believes is due to gabapentin and uncontrolled pain        Objective:   Physical Exam Patient alert and oriented and in no Cardiopulmonary distress.Pt in pain  HEENT: No facial asymmetry, EOMI, no sinus tenderness, TM's clear, Oropharynx pink and moist.  Neck supple no adenopathy.  Chest: Clear to auscultation bilaterally.  CVS: S1, S2 no murmurs, no S3.  ABD: Soft non tender. Bowel sounds normal.  Ext: No edema  MS:  Decreased  ROM spine, shoulders, hips and knees.  Skin: Intact, no ulcerations or rash noted.  Psych: Good eye contact, normal affect. Memory intact not anxious or depressed appearing.  CNS: CN 2-12 intact, power, tone and sensation normal throughout. Diabetic Foot Check:  Appearance - no lesions, ulcers or calluses Skin - no unusual pallor or redness Sensation - grossly intact to light touch Monofilament testing -  Right - Great toe, medial, central, lateral ball and posterior foot decreased Left - Great toe, medial, central, lateral ball and posterior foot decreased  Pulses Left - Dorsalis Pedis and Posterior Tibia normal Right - Dorsalis Pedis and Posterior Tibia normal         Assessment & Plan:

## 2010-04-10 NOTE — Assessment & Plan Note (Signed)
She will have a followup 2-D echo to reassess her aortic valve.

## 2010-04-13 ENCOUNTER — Ambulatory Visit: Payer: Medicare Other | Admitting: Family Medicine

## 2010-04-13 DIAGNOSIS — I4892 Unspecified atrial flutter: Secondary | ICD-10-CM

## 2010-04-14 ENCOUNTER — Encounter: Payer: Self-pay | Admitting: Family Medicine

## 2010-04-18 ENCOUNTER — Telehealth: Payer: Self-pay | Admitting: Family Medicine

## 2010-04-18 NOTE — Telephone Encounter (Signed)
pls let pt know and erx furosemide 20mg  one twice daily as needed for leg swelling #60 only, also potassium one twice daily as needed when she takes furosemide #60 only. If she needs additional pain management then I suggest a pain specialist, and am willing to refer her , pls let her know. Also she needs to elevate her legs

## 2010-04-19 MED ORDER — POTASSIUM CHLORIDE CRYS ER 20 MEQ PO TBCR: 20.0000 meq | EXTENDED_RELEASE_TABLET | Freq: Two times a day (BID) | ORAL | Status: DC | PRN

## 2010-04-19 MED ORDER — FUROSEMIDE 20 MG PO TABS: 20.0000 mg | ORAL_TABLET | Freq: Two times a day (BID) | ORAL | Status: DC | PRN

## 2010-04-19 NOTE — Telephone Encounter (Signed)
Med sent, patient aware 

## 2010-04-25 ENCOUNTER — Other Ambulatory Visit: Payer: Self-pay

## 2010-04-25 DIAGNOSIS — E785 Hyperlipidemia, unspecified: Secondary | ICD-10-CM

## 2010-04-25 MED ORDER — FENOFIBRATE 145 MG PO TABS: 145.0000 mg | ORAL_TABLET | Freq: Every day | ORAL | Status: DC

## 2010-04-27 ENCOUNTER — Other Ambulatory Visit: Payer: Self-pay

## 2010-04-27 DIAGNOSIS — I10 Essential (primary) hypertension: Secondary | ICD-10-CM

## 2010-04-27 MED ORDER — DILTIAZEM HCL ER COATED BEADS 240 MG PO CP24: 240.0000 mg | ORAL_CAPSULE | Freq: Every day | ORAL | Status: DC

## 2010-04-30 NOTE — Assessment & Plan Note (Signed)
Uncontrolled. Medication compliance addressed. Commitment to regular exercise, and healthy  eating habits with portion control discussed. DASH diet, and low fat diet discussed, and literature offered. No changes in medication at this time.  

## 2010-04-30 NOTE — Assessment & Plan Note (Signed)
Deteriorated. Patient re-educated about  the importance of commitment to a  minimum of 150 minutes of exercise per week. The importance of healthy food choices with portion control discussed. Encouraged to start a food diary, count calories and to consider  joining a support group. Sample diet sheets offered. Goals set by the patient for the next several months.    

## 2010-04-30 NOTE — Assessment & Plan Note (Signed)
Detyerirated, injection administered at vist of toradol

## 2010-04-30 NOTE — Assessment & Plan Note (Signed)
Controlled no med change

## 2010-05-02 ENCOUNTER — Telehealth: Payer: Self-pay | Admitting: Family Medicine

## 2010-05-02 ENCOUNTER — Other Ambulatory Visit: Payer: Self-pay

## 2010-05-02 NOTE — Telephone Encounter (Signed)
They say he has been getting the XR, not the CD. I deleted the CD on the medlist. Is it ok to send in the one for Diltiazem 240 XR?

## 2010-05-02 NOTE — Telephone Encounter (Signed)
Yes this is fine, pls do

## 2010-05-03 ENCOUNTER — Other Ambulatory Visit: Payer: Self-pay

## 2010-05-03 MED ORDER — DILTIAZEM HCL ER 240 MG PO CP24: 240.0000 mg | ORAL_CAPSULE | Freq: Every day | ORAL | Status: DC

## 2010-05-03 NOTE — Telephone Encounter (Signed)
Pharmacy aware

## 2010-05-04 ENCOUNTER — Other Ambulatory Visit: Payer: Self-pay | Admitting: Family Medicine

## 2010-05-10 ENCOUNTER — Encounter: Payer: Self-pay | Admitting: Cardiology

## 2010-05-10 DIAGNOSIS — R9430 Abnormal result of cardiovascular function study, unspecified: Secondary | ICD-10-CM | POA: Insufficient documentation

## 2010-05-10 DIAGNOSIS — IMO0002 Reserved for concepts with insufficient information to code with codable children: Secondary | ICD-10-CM | POA: Insufficient documentation

## 2010-05-11 ENCOUNTER — Ambulatory Visit (INDEPENDENT_AMBULATORY_CARE_PROVIDER_SITE_OTHER): Payer: Medicare Other | Admitting: Cardiology

## 2010-05-11 ENCOUNTER — Encounter: Payer: Self-pay | Admitting: Cardiology

## 2010-05-11 DIAGNOSIS — I4892 Unspecified atrial flutter: Secondary | ICD-10-CM

## 2010-05-11 DIAGNOSIS — R609 Edema, unspecified: Secondary | ICD-10-CM

## 2010-05-11 NOTE — Patient Instructions (Signed)
Your physician wants you to follow-up in: 6 months. You will receive a reminder letter in the mail one-two months in advance. If you don't receive a letter, please call our office to schedule the follow-up appointment. Your physician recommends that you continue on your current medications as directed. Please refer to the Current Medication list given to you today. 

## 2010-05-11 NOTE — Assessment & Plan Note (Signed)
Edema is greatly improved.  She is off gabapentin she is on Lasix and potassium.  She is quite stable with this and it is to be continued.  Echo revealed normal LV function and no significant valvular abnormalities.  No further workup is needed.

## 2010-05-11 NOTE — Progress Notes (Signed)
HPI Patient is seen today to follow up atrial flutter in her edema.  I had seen her on April 10, 2010.  She had significant fluid overload in her legs.  She saw her primary physician later that day.  Gabapentin was stopped.  Lasix and potassium were added.  She had marked diuresis and feels much better.  Two-dimensional echo was done to be sure there has not been a change in her LV function or valvular function.  This study showed an ejection fraction of 60% with no significant valvular abnormalities. Allergies  Allergen Reactions  . Alprazolam   . Lisinopril   . Sertraline Hcl     REACTION: diarrhea  . Sulfonamide Derivatives     Current Outpatient Prescriptions  Medication Sig Dispense Refill  . Ascorbic Acid (VITAMIN C) 500 MG tablet Take 500 mg by mouth daily.        Marland Kitchen aspirin 81 MG tablet Take 81 mg by mouth daily.        . benazepril-hydrochlorthiazide (LOTENSIN HCT) 20-12.5 MG per tablet Take 2 tablets by mouth daily.  180 tablet  3  . calcium carbonate (OS-CAL) 600 MG TABS Take 600 mg by mouth 2 (two) times daily with a meal.        . citalopram (CELEXA) 40 MG tablet TAKE 1 TABLET BY MOUTH ONCE A DAY  30 tablet  3  . diltiazem (DILACOR XR) 240 MG 24 hr capsule Take 1 capsule (240 mg total) by mouth daily.  90 capsule  1  . fenofibrate (TRICOR) 145 MG tablet Take 1 tablet (145 mg total) by mouth at bedtime.  30 tablet  3  . fish oil-omega-3 fatty acids 1000 MG capsule Take 2 g by mouth 2 (two) times daily. 2 caps bid        . furosemide (LASIX) 20 MG tablet Take 1 tablet (20 mg total) by mouth 2 (two) times daily as needed (for leg swelling).  60 tablet  0  . Ginkgo Biloba 40 MG TABS Take 1 tablet by mouth daily.        Marland Kitchen glucosamine-chondroitin 500-400 MG tablet Take 1 tablet by mouth 2 (two) times daily.       Marland Kitchen glyBURIDE-metformin (GLUCOVANCE) 2.5-500 MG per tablet Take 2 tablets by mouth 2 (two) times daily with a meal.  360 tablet  3  . HYDROcodone-acetaminophen (LORTAB) 10-500  MG per tablet TAKE 1 TABLET BY MOUTH FOUR TIMES DAILY AS NEEDED  120 tablet  2  . L-Methylfolate-B6-B12 (METANX) 2.8-25-2 MG TABS Take 1 tablet by mouth daily.        . meloxicam (MOBIC) 15 MG tablet Take 15 mg by mouth daily.        . niacin (NIASPAN) 500 MG CR tablet Take 500 mg by mouth as directed.        Marland Kitchen omeprazole (PRILOSEC) 20 MG capsule Take 20 mg by mouth daily.        . potassium chloride SA (KLOR-CON M20) 20 MEQ tablet Take 1 tablet (20 mEq total) by mouth 2 (two) times daily as needed (with furosemide for leg swelling).  60 tablet  0  . rosuvastatin (CRESTOR) 40 MG tablet Take 40 mg by mouth daily.        . vitamin E 200 UNIT capsule Take 400 Units by mouth daily.       Marland Kitchen DISCONTD: benazepril-hydrochlorthiazide (LOTENSIN HCT) 20-12.5 MG per tablet Take 1 tablet by mouth 2 (two) times daily.        Marland Kitchen  DISCONTD: aspirin 325 MG tablet Take 325 mg by mouth daily.        Marland Kitchen DISCONTD: gabapentin (NEURONTIN) 300 MG capsule Take 300 mg by mouth 2 (two) times daily.        Marland Kitchen DISCONTD: glyBURIDE-metformin (GLUCOVANCE) 5-500 MG per tablet Take 1 tablet by mouth 2 (two) times daily. 2 tabs bid          History   Social History  . Marital Status: Married    Spouse Name: N/A    Number of Children: N/A  . Years of Education: N/A   Occupational History  . retired    Social History Main Topics  . Smoking status: Never Smoker   . Smokeless tobacco: Not on file  . Alcohol Use: No  . Drug Use: No  . Sexually Active: Not on file   Other Topics Concern  . Not on file   Social History Narrative  . No narrative on file    Family History  Problem Relation Age of Onset  . Dementia Mother   . Heart disease Father     Past Medical History  Diagnosis Date  . Atrial flutter febuary 2008       atrial flutter... DC Cardioversion...successful... holding sinus as of january 2011 coumadin therapy...discontinued...severe hematoma secondary to fall  . Hypertension   . Hyperlipidemia   .  Hypercholesterolemia   . Mitral regurgitation 2010    mild ...echo...2010  . Obesity   . Sleep apnea   . Fatigue   . Depression with anxiety   . Visual loss     unspecified  . Basal cell carcinoma   . Hematochezia   . Hematoma     ...buttocks secondary to fall november 2009 with coumadin  coumadin stopped  . Diabetes mellitus     type 2  . Back pain     with radiculopathy  . Arthritis   . Osteoarthritis   . Acute cystitis   . GERD (gastroesophageal reflux disease)   . Diverticulosis of colon   . Constipation   . IBS (irritable bowel syndrome)   . Abdominal bloating   . Nausea   . Erosive esophagitis   . Aortic valve sclerosis     Ef 60 %...echo...july 2010   . Chronic granulomatous disease     right chest ... ct chest... novmber 2009  . Normal nuclear stress test     september ,2007...no ischemia  . Anticoagulant long-term use     Coumadin  stopped...severe hematoma  from fall  . Edema     Painful, April, 2012  . Ejection fraction     EF 60-65%, echo, April 13, 2010, normal RV function, mild mitral regurgitation    Past Surgical History  Procedure Date  . Bilateral bunion removel [id number]  . Bilaterial knee replacement     2000,2001  . Tonsillectomy 1957  . Appendectomy 1948  . Dilation and curettage of uterus 1952 and 2001  . Abdominal hysterectomy 2001  . Cataract extraction 2001    right   . Skin cancer excision   . Cataract extraction     left 2005  . Basal cell carcinoma excision 09/2009    right forarm and back of neck and left side     ROS  Patient denies fever, chills, headache, sweats, rash, change in vision, change in hearing, chest pain, cough, nausea vomiting, urinary symptoms.  All other systems are reviewed and are negative.  PHYSICAL EXAM Patient is oriented to person time  and place.  Affect is normal.  There is no xanthelasma.  There no carotid bruits.  Lungs are clear.  Respiratory effort is unlabored.  Cardiac exam reveals an S1-S2.  No  clicks or significant murmurs.  The rhythm is regular.  Abdomen soft.  There is no significant peripheral edema. Filed Vitals:   05/11/10 1033  BP: 133/79  Pulse: 57  Height: 5\' 3"  (1.6 m)  Weight: 179 lb (81.194 kg)    EKG EKG is not done today.  ASSESSMENT & PLAN

## 2010-05-11 NOTE — Assessment & Plan Note (Signed)
Rhythm is regular.  Clinically she's holding sinus rhythm.  No change in therapy is needed.  He'll see her back in 6 months for followup.

## 2010-05-12 ENCOUNTER — Other Ambulatory Visit: Payer: Self-pay | Admitting: *Deleted

## 2010-05-12 NOTE — Progress Notes (Signed)
Addended by: Carlye Grippe on: 05/12/2010 03:32 PM   Modules accepted: Orders

## 2010-05-13 ENCOUNTER — Other Ambulatory Visit: Payer: Self-pay | Admitting: Family Medicine

## 2010-05-15 NOTE — Progress Notes (Signed)
Patient returned call and stated that he directions for her glucovance 5/500mg  is (2) twice daily but she take 1&1/2 twice daily and 1.

## 2010-05-16 NOTE — Assessment & Plan Note (Signed)
[facility]                          [facility]   NAME:[name], [name] ASHENFELTER                         MRN:          [id number]  DATE:01/09/2008                            DOB:          Nov 07, 1929    [name] [name] is here for Cardiology followup.  I had seen her over time for  atrial flutter.  She had been cardioverted and she is held sinus rhythm  for a prolonged period of time.  In November 2009, she unfortunately  fell backwards in the bathroom, bruising her buttocks.  She had a major  internal bleed with a large hematoma.  This led to hypotension and she  in fact came into the hospital with marked dizziness and syncopal  episode.  Ultimately, she stabilized.  She did not have a CNS bleed.  She received blood.  She stabilized and she was discharged home.  We  decided at that point to hold her Coumadin until further discussions  today.  Also, her blood pressure medicines were reduced.  She has had no  recurring palpitations.   PAST MEDICAL HISTORY:   ALLERGIES:  See the flow sheet.   MEDICATIONS:  See the flow sheet.   REVIEW OF SYSTEMS:  She is not having any GI or GU symptoms now.  She  has no headaches, fevers, chills, or rashes.  She still has a hematoma,  but it is slowly resolving.  Otherwise, her review of systems is  negative.   PHYSICAL EXAMINATION:  VITAL SIGNS:  Blood blood pressure today is  higher than I would like to see.  In Dr. Anthony Sar office, her blood  pressure was 146/70.  Today, her blood pressure is 180/82.  With this in  mind, we will resume her benazepril and hydrochlorothiazide.  GENERAL:  The patient is oriented to person, time, and place.  Affect is  normal.  HEENT:  No xanthelasma.  She has normal extraocular motion.  There are  no carotid bruits.  NECK:  There is no jugular venous distention.  LUNGS:  Clear.  Respiratory effort is not labored.  CARDIAC:  S1 and S2.  There are no clicks or significant  murmurs.  ABDOMEN:  Soft.  EXTREMITIES:  She has a continued hematoma above her buttocks, but it is  slowly improving.   Her rhythm is quite regular on exam.  There is no EKG done.  We know  that while she was hospitalized she remained in sinus rhythm.   PROBLEMS:  #1.  Status post large hematoma from a bruise from falling  backwards into a bathroom.  She was on Coumadin at that time.  She is  stable.  #2.  Anxiety, stable.  #3.  Osteoarthritis.  She seems very dependent on her meds.  I will  allow her to restart her arthritis meds.  #4.  Hypertension.  We will resume her benazepril/hydrochlorothiazide.  #5.  Diabetes, treated.  #6.  Hyperlipidemia, treated.  #7.  History of sinus bradycardia in the past, stable.  #8.  History of atrial flutter.  She was successfully cardioverted in  February  2008 and has had no recurrence.  #9.  History of chronic Coumadin.  I have decided not to restart her  Coumadin.  I would do want her on a low dose of aspirin.  This decision  was made after with careful review.  In her [place], she has held sinus  rhythm.  She has had a major bleed on Coumadin even though it was  related to trauma.  At this point, I feel we should not resume her  Coumadin.  #10.  Good left ventricular function.  #11.  Mild mitral regurgitation.  #12.  No ischemia by Cardiolite in September 2007.  #13.  History of bilateral ethmoidectomy that was successful in the  past.   We will restart her blood pressure medicines for her return of  hypertension.  Her Coumadin will not be restarted.  I had a prolonged  discussion counseling the patient for greater than 30 minutes concerning  the specific issue of her Coumadin.  I will see her back in followup in  3 months.     Luis Abed, MD, [facility]  Electronically Signed    JDK/MedQ  DD: 01/09/2008  DT: 01/10/2008  Job #: [id number]   cc:   Milus Mallick. Lodema Hong, M.D.

## 2010-05-16 NOTE — Assessment & Plan Note (Signed)
[facility]                          [facility]   NAME:Dupee, MARINA BOERNER                         MRN:          [id number]  DATE:07/29/2006                            DOB:          Dec 19, 1929    HISTORY:  Ms. Harbold is doing well from the cardiac viewpoint.  She is  holding sinus rhythm.  She does have sinus bradycardia but she is not  symptomatic with it.  We had stopped her aspirin.  We had also cut her  Diltiazem dose down.  The patient had a fall.  This was not syncope or  presyncope.  Unfortunately, she does have ongoing discomfort in her  back.  She has an egg-shaped hematoma on the top of her left forearm and  this is slowly getting better and conservative therapy will be most  appropriate.  She is having some difficulties with her shoulder.  I do  not think she should proceed with any type of shoulder surgery at this  time until she improves more from her recent fall.   PAST MEDICAL HISTORY:  For other medical problems, see the list below.   ALLERGIES:  SULFA, ZOLOFT, XANAX, PRINIVIL, METOPROLOL (nausea if  greater than 25 mg).   MEDICATIONS:  1. Crestor.  2. Diltiazem 240.  3. Glyburide.  4. Vitamins.  5. Fish oil.  6. Benazepril.  7. Hydrochlorothiazide.  8. Metoprolol 25.  9. Tricor.  10.Coumadin.  11.Vicodin for the recent pain she has had with her fall.   REVIEW OF SYSTEMS:  See the HPI.   PHYSICAL EXAMINATION:  VITAL SIGNS:  Blood pressure 130/69 with a pulse  of 49.  Weight is 192 pounds.  GENERAL APPEARANCE:  The patient is somewhat uncomfortable with the  discomfort that she has in her back from her fall.  NEUROLOGICAL:  The patient is oriented to person, time and place.  Affect is normal.  HEENT:  Reveals no xanthelasma.  She has no extraocular motion.  NECK:  There are no carotid bruits.  There is no jugular venous  distention.  LUNGS:  Are clear.  Respiratory effort is not labored.  CARDIAC:  Exam reveals  an S1 with an S2.  The rhythm is regular and the  rate is slow.  ABDOMEN:  Soft, but protuberant.  EXTREMITIES:  There is question of slight edema in the left ankle.   CLINICAL DATA:  EKG reveals sinus bradycardia.   PROBLEMS:  1. Sinus bradycardia.  We will stop her low dose of metoprolol at this      time.  2. History of atrial flutter with successful cardioversion in February      of 2008.  3. Chronic Coumadin therapy.  4. Good left ventricular function.  5. Mild mitral regurgitation.  6. No evidence of ischemia by Cardiolite in September of 2007.  7. Hyperlipidemia, treated.  8. Diabetes, treated.  9. Hypertension, treated.  10.Status post bilateral ethmoidectomy surgery.   PLAN:  The patient's metoprolol will be stopped.  I will see her in  followup in three to four months.  Luis Abed, MD, [facility]  Electronically Signed    JDK/MedQ  DD: 07/29/2006  DT: 07/30/2006  Job #: [id number]   cc:   Milus Mallick. Lodema Hong, M.D.

## 2010-05-16 NOTE — Assessment & Plan Note (Signed)
[facility]                          [facility]   NAME:Guimond, RHEANA CASEBOLT                         MRN:          [id number]  DATE:07/02/2008                            DOB:          01-08-1929    PRIMARY CARDIOLOGIST:  Luis Abed, MD, [facility]   REASON FOR VISIT:  Six-month followup.   Ms. Bohorquez denies any interim development of tachy palpitations since her  previous visit.  She also denies any exertional angina pectoris;  however, she continues to report exertional fatigue, but attributes some  of this to her severe arthritis.   The patient denies any orthopnea, PND, or worsening of her mild lower  extremity edema.  She has not experienced any recent falls, denies  syncope, but does have some mild gait instability.   CURRENT MEDICATIONS:  1. Aspirin 81 daily.  2. Niaspan 1500 nightly.  3. Omeprazole 20 daily.  4. Glyburide/metformin 5/500 2 tablets b.i.d.  5. Citalopram 10 daily.  6. Crestor 40 nightly.  7. Diltiazem ER 240 daily.  8. Fish oil 2 g b.i.d.  9. Benazepril HCTZ 20/12.5 two tablets daily.  10.TriCor 145 daily.   PHYSICAL EXAMINATION:  VITAL SIGNS:  Blood pressure is 135/70, pulse is  54, regular, weight is 179 (down 1 pound).  GENERAL:  A 77 year old female sitting upright in no distress.  HEENT:  Normocephalic, traumatic.  NECK:  Palpable carotid pulses without bruits; no JVD at 90 degrees.  LUNGS:  Faint crackles in the right base, no wheezes.  HEART:  Regular rate and rhythm.  A soft grade 1-2/6 holosystolic murmur  at the base.  No diastolic blow.  No rubs.  ABDOMEN:  Soft, nontender.  EXTREMITIES:  1+ bilateral lower extremity edema.  NEUROLOGIC:  No focal deficit.   IMPRESSION:  1. History of atrial flutter.      a.     Maintaining NSR.      b.     Status post successful DCCV, February 2008.  2. History of normal LVF.  3. History of mild mitral regurgitation.  4. Hypertension.  5. Dyslipidemia.  6.  Type 2 diabetes mellitus.  7. History of severe hematoma.      a.     On Coumadin, subsequently discontinued.   PLAN:  1. A 2-D echocardiogram for reassessment of left ventricular function      and severity of mitral regurgitation.  Her last study was in      September 2007, indicating normal LVEF and mild mitral      regurgitation.  2. Return clinic followup with myself and Dr. Myrtis Ser in 6 months.      Rozell Searing, PA-C  Electronically Signed      Luis Abed, MD, [facility]  Electronically Signed   GS/MedQ  DD: 07/02/2008  DT: 07/03/2008  Job #: [id number]   cc:   Milus Mallick. Lodema Hong, M.D.

## 2010-05-16 NOTE — Assessment & Plan Note (Signed)
[facility]                          [facility]   NAME:Buckingham, DRENA HAM                         MRN:          [id number]  DATE:12/12/2006                            DOB:          [birth year]    Ms. Napierala is doing well.  Her cardiac status is stable.  We stopped her  Metoprolol at the time of her last visit because she was having some  bradycardia.  She has been stable with this without any  tachyarrhythmias.  She is taking Niaspan.  She gets flushing from this  and wants to know if she can use aspirin.  Aspirin is not absolutely  contraindicated for her.  However, she has had some oozing in the past  and she may have more if she takes aspirin with the Coumadin.  We talked  about trying her to take her Niaspan around the time of a snack or  around a mealtime.   PAST MEDICAL HISTORY:   ALLERGIES:  1. SULFA.  2. TYLOX.  3. Prudy Feeler.  4. PRINIVIL.  5. METOPROLOL (IF THE DOSE IS GREATER THAN 25 MG).   MEDICATIONS:  Crestor, Diltiazem, Glyburide, Fish Oil, Benazepril,  Hydrochlorothiazide, Tricor, Coumadin, Citalopram, Metoclopramide.   OTHER MEDICAL PROBLEMS:  See the list on my note of July 29, 2006.   REVIEW OF SYSTEMS:  Overall, she is doing well.  She has some chest  soreness and arm soreness, but I believe this is not cardiac in origin.  Otherwise, her review of systems is negative.   PHYSICAL EXAMINATION:  VITAL SIGNS:  Blood pressure is 140/72 with a  pulse of 78.  Weight is 188 pounds and this is down a few pounds since  her last visit.  GENERAL:  The patient is oriented to person, time, and place.  Affect is  normal.  HEENT:  No xanthelasma.  She has normal extraocular motion.  NECK:  There are no carotid bruits.  There is no jugulovenous  distention.  LUNGS:  Clear.  Respiratory effort is not labored.  CARDIAC:  An S1 with an S2.  There are no clicks or significant murmurs.  ABDOMEN:  Soft.  EXTREMITIES:  She has no  peripheral edema.   No labs are done today.   Problems are listed in the note of July 29, 2006.  1. Sinus bradycardia; this is improved off Metoprolol.  2. History of atrial flutter.  She is maintaining a sinus rhythm.  3. Chronic Coumadin therapy.  4. Hyperlipidemia.  She is being managed aggressively by Dr. Lodema Hong.   The patient is stable.  I will see her back in six months.     Luis Abed, MD, [facility]  Electronically Signed    JDK/MedQ  DD: 12/12/2006  DT: 12/12/2006  Job #: [id number]   cc:   Milus Mallick. Lodema Hong, M.D.

## 2010-05-16 NOTE — Assessment & Plan Note (Signed)
[facility]                          [facility]   NAME:Westmoreland, CRUCITA LACORTE                         MRN:          [id number]  DATE:12/09/2007                            DOB:          April 11, 1929    I received a phone call today from Shelby Dubin.  Our Coumadin Clinic was  notified through Dr. Syliva Overman at the patient had had an injury  with significant bleed and requirement for blood transfusion.  Her  Coumadin was held appropriately.   The patient has a history of atrial flutter and she has been stable.  She can be off her Coumadin for now.   She will be instructed to remain off her Coumadin and I will see her in  the [place] office for followup either late January or early February and  reassess her status.     Luis Abed, MD, [facility]  Electronically Signed    JDK/MedQ  DD: 12/09/2007  DT: 12/10/2007  Job #: [id number]

## 2010-05-16 NOTE — Assessment & Plan Note (Signed)
[facility]                          [facility]   NAME:[name] [name]                         MRN:          [id number]  DATE:06/13/2006                            DOB:          Mar 04, 1929    REASON FOR VISIT:  Scheduled 26-month followup.  Please refer to Dr.  Henrietta Hoover clinic note of March 13, 2006 for full details.   Patient continues to remain in normal sinus rhythm based on clinical  report, after undergoing successful DC cardioversion this past February.  She has, however, reported 2 separate incidents where she noted  significant bleeding both from one of her axilla, as well as a  spontaneous hematoma from behind the right knee.  For the latter, she  did seek medical attention at [facility] ER where she was told that the  INR was therapeutic.  The patient was treated conservatively and the  hematoma resolved spontaneously after approximately 1 month.  There is  no residual evidence currently.   Symptomatically, patient denies any tachy palpitations, but does report  feeling a little woozy when standing up.  She has not fallen or had  any frank syncope.  When asked if, in general, she notes any improvement  since undergoing successfull DC cardioversion of her atrial flutter, she  states it is difficult to tell given that she has significant arthritis.   CURRENT MEDICATIONS:  1. Coumadin as directed.  2. Aspirin 81 mg daily.  3. Celebrex 200 daily.  4. Metoclopramide 5 q.i.d.  5. Vicodin p.r.n.  6. Oxycodone p.r.n.  7. Tricor 145 daily.  8. Lexapro 10 daily.  9. Metoprolol 25 daily.  10.Benazepril/hydrochlorothiazide 20/12.5 two tablets daily.  11.Fish oil 2 gm b.i.d.  12.Glyburide/metformin 2.5/500 mg b.i.d.  13.Diltiazem ER 240 q.a.m./180 q.p.m.  14.Allegra 60 b.i.d.  15.Crestor 40 nightly.   PHYSICAL EXAMINATION:  Blood pressure 113/63.  Pulse 40/44, regular.  Weight 190 (down 2 pounds).  GENERAL:  A 77 year old  female, obese, sitting upright in no distress.  HEENT:  Normocephalic and atraumatic.  NECK:  Palpable bilateral carotid pulses without bruits.  No JVD.  LUNGS:  Clear to auscultation in all fields.  HEART:  Regular rhythm with decreased rate (S1 and S2).  No significant  murmur.  No rubs.  ABDOMEN:  Protuberant, non-tender.  EXTREMITIES:  Palpable distal pulses with trace pedal edema.  Bilateral  venous varicosity.  No evidence of ecchymosis or hematoma behind the  right knee.  NEUROLOGIC:  Flat affect, but no focal deficits.   IMPRESSION:  1. Sinus bradycardia.  2. History of asymptomatic atrial flutter.      a.     Status post successful DC cardioversion, February 2008.      b.     Chronic Coumadin.  3. Normal left ventricular function.      a.     Mild mitral regurgitation.  4. Multiple cardiac risk factors.      a.     Negative adenosine stress Cardiolite, ejection fraction 53%,       September 2007.  5. Hyperlipidemia.  6. Type  2 diabetes mellitus.  7. Hypertension.   PLAN:  1. Adjust current medication regimen with down titration of long      acting diltiazem to 240 mg daily, in light of the persistent      bradycardia.  2. Discontinue aspirin, given the patient is on longterm Coumadin, and      in light of recent episodes of spontaneous bleeding.  3. Check pro time today.  4. Return clinic for followup with myself and Dr. Willa Rough in 4 to      6 weeks for reassessment of clinical status and heart rate.      Rozell Searing, PA-C  Electronically Signed      Luis Abed, MD, [facility]  Electronically Signed   GS/MedQ  DD: 06/13/2006  DT: 06/13/2006  Job #: [id number]   cc:   Milus Mallick. Lodema Hong, M.D.

## 2010-05-16 NOTE — Op Note (Signed)
NAME:  [name] [name]                  ACCOUNT NO.:  [id number]   MEDICAL RECORD NO.:  [id number]          PATIENT TYPE:  AMB   LOCATION:  DAY                           FACILITY:  [facility]   PHYSICIAN:  R. Roetta Sessions, M.D. DATE OF BIRTH:  05-03-1929   DATE OF PROCEDURE:  07/17/2006  DATE OF DISCHARGE:                               OPERATIVE REPORT   PROCEDURE PERFORMED:  Diagnostic esophagogastroduodenoscopy followed by  colonoscopy with biopsy.   INDICATIONS FOR PROCEDURE:  77 year old lady with diabetes, intermittent  nausea and vomiting, much better on Reglan, and also intermittent  hematochezia.  EGD and colonoscopy are now being done.  This approach  has been discussed with the patient at length.  The potential risks,  benefits, and alternatives have been reviewed, questions answered, she  is agreeable.  Please see documentation in the medical record.   PROCEDURE NOTE:  O2 saturation, blood pressure, pulse oximetry were  monitored the entire procedure.  Conscious sedation Versed 3 mg IV and  Demerol 75 mg IV in divided doses for both procedures.  Cetacaine spray  for topical pharyngeal anesthesia.   INSTRUMENTATION:  Pentax video chip system.   FINDINGS:  EGD examination of the tubular esophagus revealed a single 2  cm in length distal esophageal erosion, otherwise the esophageal mucosa  appeared normal.  The EG junction was easily traversed.  The gastric  cavity was emptied and insufflated well with air.  A thorough  examination of the gastric mucosa including a retroflexion of the  proximal stomach and esophagogastric junction demonstrated normal  mucosa.  The pylorus was patent and easily traversed.  Examination of  the bulb and second portion revealed no abnormalities.   THERAPEUTIC/DIAGNOSTIC MANEUVERS PERFORMED:  None.   The patient tolerated the procedure well and was prepared for  colonoscopy.   Digital rectal exam revealed no abnormalities.  The prep was good.  The  colonic mucosa was surveyed from the rectosigmoid junction through the  left transverse, right colon, to the appendiceal orifice, ileocecal  valve and cecum.  These structures were well seen and photographed for  the record.  From this level, the scope was withdrawn.  All previously  mentioned mucosal surfaces were again seen.  The patient had left sided  transverse diverticula.  There was a diminutive 3 mm polyp in the mid  ascending colon which was cold biopsied/removed. The remainder of the  colonic mucosa appeared normal.  The scope was pulled down to the rectum  where thorough examination of the rectal mucosa including retroflex view  of the anal was done.  The anal canal demonstrated only a diminutive  polyp in the proximal rectum which was cold biopsy/removed and anal  canal hemorrhoids.   The patient tolerated both procedures well, was reacted in endoscopy.   IMPRESSION:  Esophagogastroduodenoscopy single 2 cm distal esophageal  erosion consistent with erosive reflux esophagitis, otherwise normal  esophagus, normal stomach, D1 and D2.  Colonoscopy findings anal canal hemorrhoids, diminutive rectal polyp  status post cold biopsy.  Otherwise, normal rectum. Left sided and  transverse diverticula.  Diminutive  polyp in the ascending colon, cold  biopsy.  The remainder of the colonic mucosa appeared normal.   RECOMMENDATIONS:  1. Hemorrhoid, diverticulosis, and reflux literature provided to Ms.      [name]. Begin Aciphex 20 mg orally daily.  Continue Reglan as she      likely does have an element of gastroparesis.  2. Ten day course of Anusol HC suppositories, one per rectum at      bedtime.  3. Daily fiber supplement with Metamucil or Citrucel.  4. Follow-up on pathology.  5. Follow-up appointment with [place] in three months to assess her      progress.      Jonathon Bellows, M.D.  Electronically Signed     RMR/MEDQ  D:  07/17/2006  T:  07/17/2006  Job:  [id number]   cc:    Milus Mallick. Lodema Hong, M.D.  Fax: [phone]

## 2010-05-16 NOTE — Assessment & Plan Note (Signed)
NAME:  [name] [name], [name] [name]                   CHART#:  [id number]   DATE:  04/24/2007                       DOB:  [birth year]   REASON FOR VISIT:  Follow-up of erosive reflux esophagitis nausea  bloating, history of hematochezia.  Last seen October 15, 2006.  Colonoscopy last year demonstrated only a couple of hyperplastic polyps  which were removed. Her EGD demonstrated rest reflux esophagitis.  She  had been on Reglan 5 mg q.i.d. She has been taking Prilosec for 14 days  each month.  She is really having no upper GI tract symptoms at this  time.  She never had a gastric emptying study. She is not having any  dystonia or apparent side effects with Reglan at this time. Bowel  function is described as being normal.  No rectal bleeding or melena.  She takes prune juice occasionally and moves her bowels very nicely with  his regimen every day.   CURRENT MEDICATIONS:  See updated list.   ALLERGIES:  SULFA, ZOLOFT, PRINIVIL, XANAX.   PHYSICAL EXAMINATION:  VITAL SIGNS:  Today she appears well with weight  183, height 5 feet 3-1/2 inches, temperature 98.4, BP 130/70, pulse 72.  SKIN:  Warm and dry.  CARDIAC:  Irregularly irregular rhythm without murmur, rub.  ABDOMEN:  Flat, obese, positive bowel sounds, soft, nontender, without  appreciable mass or organomegaly.   ASSESSMENT:  A history of erosive reflux esophagitis controlled on  cycled Prilosec.  She also takes Reglan.   At this point in time I feel uncomfortable recommending that she  continue Reglan. I have asked her to stop it and to be plan to take  Prilosec 20 mg daily each morning before breakfast. I warned her about  side effects of Reglan. If we could get by without it that would be the  best approach. She is to let us know if she has any change in her GI  symptoms with cessation of Reglan. Assuming she does okay will plan to  see this nice lady back in 1 year.      Jonathon Bellows, M.D.  Electronically Signed    RMR/MEDQ  D:   04/24/2007  T:  04/24/2007  Job:  [id number]   cc:   Milus Mallick. Lodema Hong, M.D.

## 2010-05-16 NOTE — Consult Note (Signed)
NAME:  [name] [name]                  ACCOUNT NO.:  [id number]   MEDICAL RECORD NO.:  [id number]          PATIENT TYPE:  AMB   LOCATION:  DAY                           FACILITY:  [facility]   PHYSICIAN:  R. Roetta Sessions, M.D. DATE OF BIRTH:  September 26, 1929   DATE OF CONSULTATION:  DATE OF DISCHARGE:                                 CONSULTATION   REQUESTING PHYSICIAN:  Milus Mallick. Lodema Hong, M.D.   REASON FOR CONSULTATION:  Chronic nausea and vomiting, abdominal  bloating.   HISTORY OF PRESENT ILLNESS:  [name] is a 77 year old female who was  referred through the courtesy of Dr. Lodema Hong.  She complains of at least  a 41-month history of intermittent nausea and vomiting.  She feels at  times her stomach is rolling. She has had a fairly extensive workup  through Dr. Anthony Sar office which has included a right upper quadrant  ultrasound on June 03, 2006.  She was found to have fatty infiltration of  the liver, no cholelithiasis.  She had a CT scan of the abdomen and  pelvis on March 04, 2006. She was found to have sigmoid colon  diverticulosis without any evidence of diverticulitis, stable tiny right  lower lobe nodule consistent with noncalcified granuloma and calcified  granuloma within the right lung base which are stable.  She had LFTs  which showed a mildly elevated AST of 47 and ALT of 45, otherwise LFTs  were normal.  She had a normal basic metabolic panel except for a  glucose 114 and BUN of 27. She had an acute hepatitis panel for A, B and  C which was negative. She tells me she was started on Reglan for  suspected diabetic gastroparesis by Dr. Lodema Hong. Her nausea has improved  since the initiation of Reglan. She is taking 5 mg q.i.d. She continues  to have abdominal bloating.  She has increased belching, rumination,  daily heartburn and indigestion.  She has been constipated and go up to  3-4 days without a bowel movement.  She has also noticed small to  moderate volume intermittent  hematochezia over the last 2-3 months with  wiping. She denies any problems with dysphagia or odynophagia.  She does  note increased flatus.  She complains of early satiety.  Her weight has  remained stable.   This colonoscopy was 8 or more years ago done in [place].   She was recently on Celebrex along with aspirin and Coumadin on a daily  basis; however, she discontinued this a couple of weeks ago.   PAST MEDICAL AND SURGICAL HISTORY:  She has a history of atrial  fibrillation on Coumadin.  She had cardioversion in February 2008. She  has diverticulosis, IBS, diabetes mellitus for 8 years,  hypercholesterolemia, arthritis, hypertension, basal cell carcinoma. She  had a melanoma removed from her neck in April 2002. She had bilateral  knee replacement in 2000 and 2002.   CURRENT MEDICATIONS:  1. Glyburide 2.5/500 mg 1-1/2 in the morning, 1-1/2 at lunch and 1 at      dinner.  2. Crestor 40 mg daily.  3. Tricor 145 mg daily.  4. Diltiazem ER 240 mg daily.  5. Benazepril 20/12.5 mg 2 p.o. daily.  6. Metoprolol 25 mg daily.  7. Hydrocodone 10/500 mg q.i.d.  8. Citalopram 10 mg daily.  9. Fexofenadine 120 mg daily.  10.Coumadin 2-1/2 mg every day except Monday and Friday when she takes      5 mg.  11.Metoclopramide 5 mg q.i.d.  12.Fish oil 2 grams b.i.d.  13.Calcium 600 mg b.i.d.  14.Flax seed oil 100 mg b.i.d.  15.Vitamin E 400 international units daily.  16.Vitamin C 500 mg daily.  17.Biotin 1000 mcg daily.   ALLERGIES:  SULFA, PRINIVIL, XANAX and ZOLOFT.   FAMILY HISTORY:  Positive for a sister with colon polyps.   SOCIAL HISTORY:  [name] is married.  She had one grown healthy son.  She is a retired Optician, dispensing.  She denies any  tobacco or drug use.  She occasionally drinks alcohol.   REVIEW OF SYSTEMS:  See HPI otherwise negative.   PHYSICAL EXAMINATION:  VITAL SIGNS:  Weight 195-1/2 pounds, height 63-  1/2 inches, temperature 98 degrees, blood  pressure 122/60, pulse 52.  GENERAL:  Ms. Rea is a well-developed, well-nourished, elderly,  Caucasian female in no acute distress.  HEENT.  Sclera clear, nonicteric, conjunctiva  pink.  Oropharynx pink  and moist without any lesions.  NECK:  Supple without any mass or thyromegaly.  CHEST:  Heart regular rate and rhythm. Normal S1, S2 without murmurs,  clicks, rubs or gallops.  LUNGS:  Clear to auscultation bilaterally.  ABDOMEN:  Positive bowel sounds x4.  No bruits auscultated.  Abdomen is  slightly distended.  She does have an easily reducible umbilical hernia.  There is no rebound tenderness or guarding.  No hepatosplenomegaly or  mass.  Exam is limited given the patient's body habitus.  EXTREMITIES:  Without clubbing or edema bilaterally.   IMPRESSION:  [name] is a 77 year old female with a 53-month history of  chronic intermittent nausea and vomiting which I suspect is due to  peptic ulcer disease or diabetic gastroparesis.  She has responded  nicely to Reglan making gastroparesis high in the differential.  She  also complains of abdominal bloating and intermittent hematochezia.  Recent ultrasound shows fatty liver and I suspect this is most likely  causing mild bump and transaminases.  She is going to require  colonoscopy as it has been over 8 years since the last exam and to  further evaluate her symptoms including hematochezia.   PLAN:  1. I have discouraged her from the concomitant use of Celebrex,      aspirin and Coumadin.  She is currently on Coumadin alone.  2. She may benefit from PPI however, will hold off until procedures.  3. EGD and colonoscopy with Dr. Jena Gauss in the near future.  I discussed      both procedures including risks and benefits which include but are      not limited to bleeding, infection, perforation, and drug reaction.      She agrees to the plan and consent will be obtained. She is to hold      her Coumadin for 5 days prior to the procedures, take  half      of her diabetes medicines the day prior to and of the procedure,      avoid Celebrex and aspirin prior to the procedure and indefinitely      if possible.   We would like to thank  Dr. Lodema Hong for allowing [place] to participate in the  care of Ms. Mullett.      Lorenza Burton, N.P.      Jonathon Bellows, M.D.  Electronically Signed    KJ/MEDQ  D:  07/08/2006  T:  07/09/2006  Job:  [id number]   cc:   Milus Mallick. Lodema Hong, M.D.  Fax: [phone]

## 2010-05-16 NOTE — Procedures (Signed)
NAME:  [name] [name], [name] [name]                  ACCOUNT NO.:  [id number]   MEDICAL RECORD NO.:  [id number]          PATIENT TYPE:  OUT   LOCATION:  SLEE                          FACILITY:  [facility]   PHYSICIAN:  Kofi A. Gerilyn Pilgrim, M.D. DATE OF BIRTH:  Jul 26, 1929   DATE OF PROCEDURE:  DATE OF DISCHARGE:  04/01/2008                             SLEEP DISORDER REPORT   INDICATIONS:  This is a 78-year lady who presents with snoring and  hypersomnia.  She is being evaluated for obstructive sleep apnea  syndrome.   MEDICATIONS:  Glyburide, Crestor, TriCor, diltiazem, benazepril,  hydrocodone, Celexa, Metanx, omeprazole, Mobic, Niaspan, fish oil,  calcium, vitamin E, Vitamin C, aspirin, and Biotin.   EPWORTH SLEEPINESS SCALE:  Epworth sleepiness scale 13.  BMI 33.   ARCHITECTURAL SUMMARY:  Total recording time is 385 minutes.  Sleep  efficiency 92%.  Sleep latency 4 minutes.  REM latency 133 minutes.  Stage N1 7%, N2 60% and N3 3%.  REM sleep 22%.   RESPIRATORY SUMMARY:  The baseline oxygen saturation is 93%.  Lowest  saturation 83% during REM sleep.  AHI is 9.4.  The REM AHI is 32.   LIMB MOVEMENT SUMMARY:  PLM Index 0.   ELECTROCARDIOGRAM SUMMARY:  Average heart rate is 66, with no  significant dysrhythmias observed.   IMPRESSION:  Mild obstructive sleep apnea syndrome, worsened REM sleep,  not requiring positive pressure.   Thanks for this referral.      Kofi A. Gerilyn Pilgrim, M.D.  Electronically Signed     KAD/MEDQ  D:  04/05/2008  T:  04/05/2008  Job:  [id number]

## 2010-05-16 NOTE — Assessment & Plan Note (Signed)
NAME:  [name] [name], [name] [name]                   CHART#:  [id number]   DATE:  10/15/2006                       DOB:  04/07/1929   CHIEF COMPLAINT:  Follow-up colonoscopy and EGD, erosive reflux  esophagitis/suspected diabetic gastroparesis and hematochezia.   SUBJECTIVE:  The patient is a 77 year old Caucasian female.  She has a  history of diabetes mellitus.  She had intermittent nausea and vomiting  which responded to Reglan, suspected it could be related to a  combination of GERD and gastroparesis.  She underwent EGD and  colonoscopy by Dr. Jena Gauss for hematochezia on July 17, 2006.  She was  found to have a single 2 cm distal esophageal erosion consistent with  erosive reflux esophagitis.  She was found to have diminutive rectal  polyp which was biopsied and hyperplastic as well as an ascending colon  hyperplastic polyp.  She was started on Aciphex 20 mg daily.  She admits  to this medicine being too expensive.  She switched over to over-the-  counter Prilosec, but has not been taking it on a daily basis.  She has  continued to take the Reglan 5 mg q.i.d.  She also completed a 10-day  course of Anusol HC suppositories and has been taking Metamucil or  Citrucel whichever one is on sale.  She rarely has nausea and vomiting,  only with certain foods such as stuffed peppers she had the other day.  She denies any side effects from the Reglan.  She denies any further  hematochezia.  She denies any abdominal pain, heartburn, or indigestion.  She does have a family history of a sister with colon polyps, but she  believes that hers may have been benign as well and not adenomatous.   CURRENT MEDICATIONS:  See the list from October 15, 2006.   ALLERGIES:  SULFA, ZOLOFT, PRINIVIL, XANAX.   OBJECTIVE:  VITAL SIGNS:  Weight 191 pounds, height 63-1/2 inches,  temperature 98.1, blood pressure 120/64, pulse 80.  GENERAL:  The patient is an elderly Caucasian female who is alert and  oriented, pleasant,  cooperative, and in no acute distress.  HEENT:  Sclerae anicteric, conjunctivae pink.  Oropharynx pink and moist  without any lesions.  CHEST:  Heart regular rate and rhythm, normal S1 and S2 without murmurs,  rubs, or gallops.  ABDOMEN:  Positive bowel sounds x4, no bruits auscultated.  Abdomen is  slightly distended.  Abdomen is nontender.  There is no rebound  tenderness or guarding.  No hepatosplenomegaly or mass.  EXTREMITIES:  Without clubbing or edema bilaterally.   ASSESSMENT:  1. The patient is a 77 year old female with erosive reflux esophagitis      and suspected diabetic gastroparesis with good results with      concomitant PPI and low dose Reglan.  She has fatty liver and mild      transaminitis history.  Last LFT's showed AST 47, ALT 45.  2. Constipation and hemorrhoidal bleeding has resolved with the      addition of fiber and hemorrhoidal treatment.   PLAN:  1. Colonoscopy in 10 years if she is still in good health unless there      is verified history of adenomatous polyps in her sister.  2. We will give her Aciphex samples 20 mg daily as well as a rebate  card and patient assistance form.  3. Suggested weight loss.  4. Continue Metamucil and Citrucel.  5. She can try to decrease her Reglan by eliminating her a.m. dosing      and see how she does.  6. Office visit with Dr. Jena Gauss in six months or sooner if needed.       Lorenza Burton, N.P.  Electronically Signed     R. Roetta Sessions, M.D.  Electronically Signed    KJ/MEDQ  D:  10/15/2006  T:  10/15/2006  Job:  [id number]   cc:   Milus Mallick. Lodema Hong, M.D.

## 2010-05-16 NOTE — Assessment & Plan Note (Signed)
[facility]                          [facility]ICE NOTE   NAME:[name] [name], [name] [name]                         MRN:          [id number]  DATE:05/20/2007                            DOB:          May 19, 1929    [name] [name] is doing well.  She had had atrial flutter and we cardioverted  her in February 2008.  She has held sinus rhythm.  She has actually done  well.  She is not having any chest pain.  She is on Coumadin.  She does  take Niaspan on limited numbers per week.   PAST MEDICAL HISTORY:  Allergies:  SULFA, TYLOX, XANAX, PRINIVIL AND  METOPROLOL AT THE DOSES GREATER THAN 25 MG.   Medications:  Citalopram, glyburide, omeprazole, meloxicam, Niaspan,  Crestor, Allegra, diltiazem 120 mg, glyburide, fish oil, calcium,  benazepril/hydrochlorothiazide, metoprolol, Lexapro and Tricor.   Other medical problems:  See the complete list below.   REVIEW OF SYSTEMS:  She is really feeling well.  She has no significant  complaints.   PHYSICAL EXAM:  Blood pressure is 135/77 with a pulse of 68.  The patient is oriented to person, time and place.  Affect is normal.  HEENT:  No xanthelasma.  She has normal extraocular motion.  No carotid bruits.  There is no jugular venous distention.  LUNGS:  Clear.  Respiratory effort is not labored.  CARDIAC:  An S1 with an S2.  There are no clicks or significant murmurs.  ABDOMEN:  Soft.  There is no peripheral edema.   EKG reveals normal sinus rhythm.   PROBLEMS:  1. History of some sinus bradycardia but she does tolerate low-dose      metoprolol.  2. History of atrial flutter after cardioversion, successful, in      February 2008.  3. Chronic Coumadin.  4. Good left ventricular function.  5. Mild mitral regurgitation.  6. History of no ischemia by Cardiolite in September 2007.  7. Hyperlipidemia, treated.  8. Diabetes, treated.  9. Hypertension, treated.  10.History of bilateral ethmoidectomy that was  successful.   [name] [name] is actually doing well.  I have not change her medicines.  I  will see her back in a year.     Luis Abed, MD, [facility]  Electronically Signed    JDK/MedQ  DD: 05/20/2007  DT: 05/20/2007  Job #: [id number]   cc:   Milus Mallick. Lodema Hong, M.D.

## 2010-05-19 NOTE — Discharge Summary (Signed)
NAME:  [name] [name]                  ACCOUNT NO.:  [id number]   MEDICAL RECORD NO.:  [id number]          PATIENT TYPE:  INP   LOCATION:  A223                          FACILITY:  [facility]   PHYSICIAN:  Osvaldo Shipper, MD     DATE OF BIRTH:  August 05, 1929   DATE OF ADMISSION:  04/25/2004  DATE OF DISCHARGE:  04/29/2006LH                                 DISCHARGE SUMMARY   DISCHARGE DIAGNOSES:  1.  Hypotension, resolved, likely secondary to sepsis.  2.  Diarrhea, likely secondary to antibiotic or irritable bowel syndrome,      improving.  3.  Hypertension.  4.  Diabetes.  5.  Chronic back pain.   Please review H&P dictated on April 25, 2004 for details regarding the  patient's presenting illness.   BRIEF HOSPITAL COURSE:  Problem 1.  Hypertension.  The patient is a 77-year-  old Caucasian female who presented with feeling weak and dizzy with nausea.  This happened while she was painting on the day of admission.  She was  painting her bathroom which had no windows.  On admission, the patient was  found to have a low blood pressure around 61/33.  She was also found to be  bradycardic.  She was afebrile.  She did have mildly positive UA.  Etiology  for her low blood pressure could have been UTI, could have been secondary to  the paint fumes.  Anyway, the patient was admitted to the ICU, put on  pressors for a day or two, with which her blood pressure improved.  She was  also put on antibiotics.  The patient was subsequently transferred out to  the floor.  She continued to do well in terms of her blood pressure.  Slowly, her antihypertensive agents were introduced.  We are holding her  Cardizem at this time which could be restarted once the patient sees her  primary medical doctor.   Problem 2.  Diarrhea.  The patient started getting loose, watery stools  about the second or third day of admission.  Her C. diff has been negative.  The patient does have a history of IBS.  It was thought that  the diarrhea  was likely secondary to either IBS or due to antibiotics.  The patient was  started on Kaopectate with which her diarrhea has improved.  On the day of  discharge, she has more formed stools, although she still is having some  frequency of diarrhea.  However, the patient is very keen on going home.  Her blood work was all within normal limits today.  There is no evidence for  dehydration anymore in this patient.  Since her symptoms are improving, we  will discharge the patient on Kaopectate.   Problem 3.  Hypertension.  As mentioned above, her antihypertensive agents  were slowly reintroduced in the course of this admission.  At the time of  discharge, we are going to hold her Cardizem, and she needs to see her  doctor, Dr. Lodema Hong, before this medication was restarted.   Problem 4.  Diabetes.  Glucovance was restarted,  and the patient is to  follow up with her PMD.   DISCHARGE MEDICATIONS:  1.  Kaopectate 15 mL every four hours p.o. p.r.n. for diarrhea.  2.  Levofloxacin 500 mg p.o. daily for two more days.  3.  The patient is to resume all of her home medications except Cardizem.      It could be restarted once seen by her PMD.   FOLLOW-UP CARE:  The patient needs to see Milus Mallick. Simpson, M.D. in the  next 1-2 weeks to follow up.   DIET:  1800-calorie ADA diet.   PHYSICAL ACTIVITY:  No restrictions.   DISCHARGE INSTRUCTIONS:  The patient has been told that if she has increased  symptoms of diarrhea or she feels weak and dizzy again, she needs to seek  medical attention immediately.      GK/MEDQ  D:  04/29/2004  T:  04/29/2004  Job:  [id number]   cc:   Milus Mallick. Lodema Hong, M.D.  [address]  [place], [place] [address]  Fax: [phone]

## 2010-05-19 NOTE — Assessment & Plan Note (Signed)
[place] [facility]                            [facility]ICE NOTE   NAME:[name] [name], [name] [name]                         MRN:          [id number]  DATE:10/04/2005                            DOB:          July 29, 1929    HISTORY OF PRESENT ILLNESS:  [name] [name] returns to the clinic today for  scheduled followup, after being seen here on September 25, 2005, in followup  of her known, asymptomatic atrial flutter. She is awaiting clearance from  [place], in order to proceed for ENT surgery. The patient has had recent  diagnostic testing, consisting of an echocardiogram revealing normal left  ventricular function with no definite WMA ______ and mild mitral  regurgitation. An Adenosine stress test showed no definite ischemia  (ejection fraction of 53%.) The patient has documented, asymptomatic atrial  flutter with variable conduction. There was some concern about adequate rate  control but this was further explored with the patient and, it turns out,  she had not taken her four 20 mg of Diltiazem on the day of her scheduled  echocardiogram. At all other times, she has been taking her medication and  has presented to [place] here in the clinic with adequately controlled  ventricular response. Her pulse today is 78, and that is baseline and after  walking in the hallway for approximately 5 minutes, was 82 after exercise.  Blood pressure was 142/70. I conferred with Dr. Truman Hayward and cleared  the patient to proceed with surgery, from a cardiac standpoint. No further  adjustment of her current medication regimen is recommended at this time,  given the patient's adequately controlled ventricular response. We will plan  on having [name] [name] return to the clinic and followup with Dr. Myrtis Ser,  following her surgery.      ______________________________  Rozell Searing, PA-C    ______________________________  Learta Codding, MD,FACC    GS/MedQ  DD:  10/04/2005  DT:  10/05/2005  Job  #:  [id number]   cc:   Luis Abed, MD, [facility]  Truman Hayward, MD

## 2010-05-19 NOTE — Assessment & Plan Note (Signed)
[facility]                            [facility] NOTE   NAME:[name], [name] HIGHLAND                         MRN:          [id number]  DATE:09/25/2005                            DOB:          1929-02-08    PRIMARY CARDIOLOGIST:  Luis Abed, MD.   [name] returned to the office today in follow-up of recent diagnostic  testing for further evaluation of asymptomatic atrial flutter.   The patient initially presented to Dr. Myrtis Ser on September 20 for a  preoperative cardiac clearance.  She is scheduled to undergo sinus surgery  tomorrow morning.  A preop EKG was abnormal and suggestive of atrial  flutter, and she was referred for further evaluation.  The duration of the  arrhythmia is unknown as patient has been, and continues to remain,  asymptomatic with this arrhythmia.   A 2 D echocardiogram was done yesterday revealing normal left ventricular  function (EF 65%) with no definite wall motion abnormalities and mild mitral  regurgitation.   An adenosine stress Cardiolite was also done yesterday revealing no definite  perfusion defect; calculated ejection fraction 53%.   Electrocardiogram today reveals atrial flutter with variable conduction at a  rate of 77 BPM.   The patient continues to report no symptoms since recently seen here in the  clinic.   There has been no change in her medication regimen.   Recent blood work consisting of a BMET and a CBC is within normal limits.   The results of these studies were reviewed with Dr. Myrtis Ser; the patient is  cleared to proceed with surgery tomorrow morning, as planned.  She will  return to our clinic in the next few weeks to follow up with Dr. Myrtis Ser  regarding further recommendations for management of her asymptomatic atrial  flutter.                                   Gene Serpe, PA-C                                Luis Abed, MD, [facility]   GS/MedQ  DD:  09/25/2005  DT:  09/27/2005  Job  #:  [id number]   cc:   Truman Hayward, M.D.  Milus Mallick. Lodema Hong, M.D.

## 2010-05-19 NOTE — Assessment & Plan Note (Signed)
[place] [facility]                            [facility]ICE NOTE   NAME:Cookston, LITHA LAMARTINA                         MRN:          [id number]  DATE:09/25/2005                            DOB:          01-08-1929    ADDENDUM #2:  After further review, I now understand that Ms. Lalani did not take her  medications on the morning that she had her echo study.  She takes high-dose  Cardizem, and this clearly could play a role in her resting heart rate being  increased.  With this in mind, we have decided not to add any medications.  We will see her back in the office next week and review all of the issues  and then carefully communicate with Dr. Para March and Dr. Katrinka Blazing and aim  toward picking a time for possible surgery for the patient.                                   Luis Abed, MD, [facility]   JDK/MedQ  DD:  09/25/2005  DT:  09/27/2005  Job #:  [id number]

## 2010-05-19 NOTE — Procedures (Signed)
NAME:  Wirz, AYLEEN MCKINSTRY NO.:  [id number]   MEDICAL RECORD NO.:  [id number]          PATIENT TYPE:  REC   LOCATION:  RAD                           FACILITY:  [facility]   PHYSICIAN:  Vida Roller, M.D.   DATE OF BIRTH:  [birth year]   DATE OF PROCEDURE:  DATE OF DISCHARGE:                                    STRESS TEST   ADENOSINE MYOVIEW   This 77 year old female with no known coronary artery disease, however,  significant cardiac risk factors including family history of hyperlipidemia,  diabetes, and hypertension.  She has no cardiac complaints.  Test is being  done for increased cardiac risk.   Baseline data:  EKG reveals sinus rhythm at 61 beats/minute with poor R wave  progression, nonspecific ST abnormalities, blood pressure is 148/70.  48 mg  of adenosine given over a four-minute protocol with Myoview injected at 3  minutes.  The patient reported chest heaviness and light-headedness which  resolved in recovery.  EKG revealed no arrhythmias.  She did have some ST  depression in the inferolateral leads that resolved in recovery.   Final images and results are pending M.D. review.      AB/MEDQ  D:  02/22/2004  T:  02/22/2004  Job:  [id number]

## 2010-05-19 NOTE — H&P (Signed)
NAME:  Greenwood, Taji                  ACCOUNT NO.:  [id number]   MEDICAL RECORD NO.:  [id number]          PATIENT TYPE:  INP   LOCATION:  IC07                          FACILITY:  [facility]   PHYSICIAN:  Calvert Cantor, M.D.     DATE OF BIRTH:  10-27-1929   DATE OF ADMISSION:  04/25/2004  DATE OF DISCHARGE:  LH                                HISTORY & PHYSICAL   ADMITTING COMPLAINT:  Feeling weak and dizzy.   HISTORY OF PRESENT ILLNESS:  This is a 77 year old white female who was  painting earlier today in a bathroom which did not have any windows.  After  finishing painting, she felt extremely weak, dizzy and a little nauseated.  She drove home.  When she arrived home, she laid down.  Afterwards, she had  some vomiting.  She tried to get out of bed, however, was extremely dizzy  and was brought to the ER by her husband.  When in the ER, she was found to  be hypotensive and bradycardic and was therefore admitted.  The patient  states that she is a diabetic and she had not eaten much that day, which  added to her weakness.  She does not have any complaints of fever or  headache.  No complaints of chest pain or abdominal pain.  No diarrhea or  dysuria.  No cough, sore throat or shortness of breath.  All other review of  systems is negative.   PAST MEDICAL HISTORY:  Significant for:  1.  Noninsulin-dependent diabetes mellitus.  2.  Hypertension.  3.  History of hypothyroidism.  4.  History of depression in the past.  5.  History of hyperlipidemia.  6.  History of basal cell cancer in the left neck and in the posterior      thigh.  7.  History of fatty liver.  8.  History of diverticulosis.  9.  History of irritable bowel syndrome.   PAST SURGICAL HISTORY:  1.  The patient has had multiple knee surgeries on bilateral knees.  2.  She has had cataract removal, one in 2000 and one recently in 2004.  3.  She has had removal of a melanoma from the base of her neck in 2002.  4.  Basal cell  cancer in the posterior thigh resected in 2002.  5.  Neuroma excision from the left foot in 1972.   MEDICATIONS:  She takes:  1.  Hyzaar 100/25 mg daily.  2.  Toprol XL 200 mg daily.  3.  Glucovance 2.5/500 mg daily.  4.  Cardizem 420 mg daily.  5.  Norvasc 5 mg daily.  6.  Hydrocodone/acetaminophen 10/500 mg q.i.d.  7.  Vitamin C, vitamin E, alpha and calcium carbonate.   ALLERGIES:  1.  SULFA causes a rash.  2.  PRINIVIL causes nausea and vomiting.  3.  Xanax causes a rash.   SOCIAL HISTORY:  She is a nonsmoker.  She drinks alcohol occasionally.  She  is married, living with her husband.  She has one son, who is alive and  healthy.  PHYSICAL EXAMINATION:  VITAL SIGNS:  In the ER, the patient initially had a  blood pressure of 83/61, which dropped down to 61/33.  Her pulse was in the  40s.  After she was started on dopamine, her blood pressure increased above  100.  Pulse oximetry was 100% on room air.  The respiratory rate was 16.  The patient was afebrile.  GENERAL APPEARANCE:  She is awake and alert, lying in bed.  HEENT:  Normocephalic and atraumatic.  Pupils are equal, round and reactive  to light and accommodation.  Oral mucosa is moist.  NECK:  Supple.  HEART:  Regular rate and rhythm.  Bradycardic.  LUNGS:  Clear.  ABDOMEN:  Soft, nontender and nondistended.  Bowel sounds are positive.  EXTREMITIES:  Trace pitting edema, which she states worsens when she states  and is improved when she wakes up in the morning.   LABORATORY DATA:  The white count is 19.5, hemoglobin 13.8, hematocrit 39.7,  MCV 90.7 and platelets are 251.  The PT is 13.6 and the PTT is 26.  The  sodium is 134, potassium 3.7, chloride 97, bicarbonate 28, glucose 339, BUN  30, creatinine 1.7 and calcium 9.1.  UA is positive for nitrites, bacteria  and a small amount of urobilinogen, which is 0.2.   EKG shows normal sinus rhythm at 87 beats per minute.  PR interval is  normal.  There are no ST  changes.  T waves are upright.  No Q waves noticed.   ASSESSMENT AND PLAN:  This is a 77 year old white female who is being  admitted for dizziness, weakness, bradycardia, hypotension, elevated white  count, fever and a positive UA.  She has some renal insufficiency as well.  She is going to be admitted to the ICU and maintained on a dopamine drip and  IV fluids until her blood pressure and heart rate improve.  She is going to  be given antibiotics.  She will have blood cultures done and a urine culture  done.  Her hypertension medications will be held until she improves.  She  will be given IV fluids.  She will receive Reglan for nausea, continuous  oxygen, Protonix and Lovenox for DVT prophylaxis.   For diabetes, she will be maintained on an insulin sliding scale.  Her oral  medications will be held currently because her renal function is high.      SR/MEDQ  D:  04/26/2004  T:  04/26/2004  Job:  [id number]

## 2010-05-19 NOTE — Assessment & Plan Note (Signed)
[facility]                            [facility]   NAME:Swift, CHAUNTELLE AZPEITIA                         MRN:          [id number]  DATE:09/20/2005                            DOB:          07-Jul-1929    Ms. Monte is here for cardiac clearance for surgery for ENT surgery.  The  patient is 1.  She had an EKG done and it was noted that she has atrial  flutter.  This is not a diagnosis that has been known to her in the past.  She has had EKGs in the past, but not for several years.  She does notice  that her heart rate varies from time to time.  She has not had syncope or  presyncope.  She has no significant chest pain.  She does not have a high  exercise level, but she does not appear to have any marked change in her  breathing capacity.   ALLERGIES:  SULFA, ZOLOFT, XANAX AND PRINIVIL.   MEDICATIONS:  1. Crestor 40.  2. Aspirin 81.  3. Allegra 60 b.i.d.  4. Diltiazem ER 420.  5. Glyburide 2.5/500 b.i.d.  6. Fish oil.  7. Lotensin/hydrochlorothiazide 20/12.5.  8. Tricor 145.  9. Vitamins.  10.Calcium.   PAST MEDICAL HISTORY:  See the list below.   SOCIAL HISTORY:  The patient does not smoke.   FAMILY HISTORY:  There is no strong family history of coronary artery  disease.   REVIEW OF SYSTEMS:  The patient has hypertension.  There is also diabetes.  She has significant problems with her sinuses and this is why she needs the  surgery.  Otherwise, her review of systems is negative.   PHYSICAL EXAMINATION:  VITAL SIGNS:  Blood pressure 132/74, heart rate 90.  GENERAL:  The patient is oriented to person, time and place.  Affect is  normal.  LUNGS:  Clear.  Respiratory effort is not labored.  HEENT:  No xanthelasma.  She has normal extraocular motion.  There are no  carotid bruits.  There is no jugular venous distention.  CARDIAC:  S1, S2.  There are no clicks or significant murmurs.  ABDOMEN:  Obese, but soft.  There are no masses or  bruits.  EXTREMITIES:  She has no significant peripheral edema.  She does have some  kyphosis.   PROBLEMS:  1. Hypertension.  2. Diabetes.  3. Hyperlipidemia.  4. History of bilateral knee replacement in the remote past.  5. Cataract surgery.  6. Status post hysterectomy and appendectomy.  7. Hypertension.  8. Allergies to Zoloft, sulfa, Xanax and Prinivil.  9. Atrial flutter.  The duration is unknown.  She may well have had this      for a prolonged period of time.  She has had no syncope or presyncope.      She needs a 2-D echocardiogram to assess LV function and valvular      function.  The atrial flutter itself is probably stable.  However, the      patient has a multitude of risk factors for coronary artery disease.  Because of this and the atrial flutter, I am uncomfortable clearing her      at this point for her sinus surgery before further evaluation.  I will      also arrange for an adenosine Cardiolite scan.  We will try to do these      soon and have the patient seen for early followup.  If these studies      are okay, then the patient may be able to have her surgery next week.      We can then see her for further followup and decide about Coumadin and      other approaches for her atrial flutter.  If she has LV dysfunction or      if she has significant ischemia by Cardiolite scan, further evaluation      will be necessary before she can be put to sleep for her sinus surgery.                                   Luis Abed, MD, [facility]   JDK/MedQ  DD:  09/20/2005  DT:  09/22/2005  Job #:  [id number]   cc:   Truman Hayward, M.D.  Milus Mallick. Lodema Hong, M.D.

## 2010-05-19 NOTE — Assessment & Plan Note (Signed)
[facility]                            [facility]   NAME:[name], [name]                         MRN:          [id number]  DATE:09/25/2005                            DOB:          Sep 09, 1929    [name] [name] had been seen here in the office today to try to get her ready for  her ENT surgery. I had seen her last week, and we arranged for an  echocardiogram and a nuclear study. This was arranged because she has risk  factors for coronary disease. When I saw her today in the office with Gene  Serpe, I knew that she had normal left ventricular function. I also knew  that she had no ischemia on her Cardiolite scan. I spoke with Dr. Katrinka Blazing and  Dr. Para March, and Dr. Para March mentioned to me his appropriate concern about  her increased heart rate. At the time of my discussion with him, I did not  have the information in front of me that it was during her echocardiogram  that the patient's heart rate actually increased. At that time, Dr. Andee Lineman  had noted that the patient in fact had a heart rate as high as 135, at one  point during the echocardiogram.   This piece of information clearly changes the approach. Up until this point,  we had no history of any symptoms from her heart rate, and we thought that  her heart rate was under control. Dr. Para March makes the point that patients  with sinus surgery frequently get marked heart rate increases, even when  they have sinus rhythm. With this in mind, we will contact the patient and  carefully adjust her medicines. I will start by adding a beta blocker and  seeing her back in the office to be sure that we felt that we have very good  heart rate control. If this can be accomplished, I feel that her sinus  surgery can be done safely. If not, we will have to refer the patient for  possible further electrophysiological testing and treatment and possibly a  flutter ablation before her surgery can be done.   ADDENDUM #2:  After further review, I now understand that [name] [name] did not take her  medications on the morning that she had her echo study.  She takes high-dose  Cardizem, and this clearly could play a role in her resting heart rate being  increased.  With this in mind, we have decided not to add any medications.  We will see her back in the office next week and review all of the issues  and then carefully communicate with Dr. Para March and Dr. Katrinka Blazing and aim  toward picking a time for possible surgery for the patient.                                  Luis Abed, MD, [facility]   JDK/MedQ  DD:  09/25/2005  DT:  09/27/2005  Job #:  [id number]   cc:  Truman Hayward, M.D.  Dr. Para March

## 2010-05-19 NOTE — Consult Note (Signed)
NAME:  [name] [name], [name] [name]                            ACCOUNT NO.:  [id number]   MEDICAL RECORD NO.:  [id number]                   PATIENT TYPE:  REC   LOCATION:                                       FACILITY:  [facility]   PHYSICIAN:  Jonelle Sports. Sevier, M.D.              DATE OF BIRTH:  10/16/1929   DATE OF CONSULTATION:  06/24/2003  DATE OF DISCHARGE:                                   CONSULTATION   HISTORY:  This 77 year old white female is referred by Dr. Lodema Hong for  evaluation of her foot wear and some nonspecific foot pain.   The patient has had type II diabetes for some time and apparently is thought  to be in reasonably good control.  She has had numerous other problems to  include removal of a neuroma of the left foot and bilateral bunionectomy  some twenty years ago, and bilateral knee replacement in the recent past.  She is known to have degenerative joint disease in her distal spine and some  pain in her feet on walking, but she thinks there is some relationship to  that.  She does not have rest pain and her walking pain is not typically  claudication nor is it typically diabetic pain.  Her gait is somewhat  unstable and she uses a cane as needed.  She has had a tendency to clawing  of her toes and some callus formation on her feet and, accordingly, has been  prescribed diabetic shoes.  She consulted [employer] in [place]  for this, and was given a pair of shoes which she considers too large and  otherwise unsatisfactory, along with three sets of inserts.  She is here  today for [place] to assess her footwear for its adequacy and to see if see if we  have any other recommendations regarding her pain.   PAST MEDICAL HISTORY:  Past medical history is noteworthy for hypertension,  obesity, and hypercholesterolemia, in addition to those things previously  mentioned.   ALLERGIES:  She is said to be allergic to PRINIVIL, SULFA, XANAX, PAXIL and  ZOLOFT.   MEDICATIONS:  Her  regular medications include Toprol XL,  hydrochlorothiazide, Hydrocodone, Vytorin, Glucovance, Valium, aspirin,  calcium and vitamin C and E.   PHYSICAL EXAMINATION:  Examination today is limited to the distal lower  extremities.  The patient's feet are without significant edema, but they do  show some degree of deformity, characterized by clawing of the toes  bilaterally.  Skin temperatures are high normal and symmetrical.  Pulses are  every where present and adequate.  Monofilament testing shows that she lacks  protective sensation in the toes and metatarsal heads bilaterally, whereas  sensation is protective throughout the remainder of the foot.   There is a small callus underlying the first metatarsal head on the left  foot.   DISPOSITION:  1. The patient was given instructions  regarding foot care and diabetes by     video with nurse and physician reinforcement.  2. The patient's pain was reviewed and discussed and it would be my thought     that this is not likely on a diabetic basis and probably is, indeed, more     related to her degenerative disk and joint disease.  3. The callus underlying the first metatarsal head on the left is sharply     pared without incident.  4. The patient's footwear is evaluated and, indeed, the shoes she has been     given appear to be somewhat large and much firmer in the sole than is     ideal for her situation.  In addition, her inserts do not appear to be     specifically custom-made and lack adequate central metatarsal head     posting (particularly in that she does have a tendency of both 4th     metatarsal heads to be dropped).  Again, the materials used are not those     that we traditionally use for diabetics.  5. In view of this, the patient was given referral to go for consultation     with [employer] here in [place], to see if they have other foot     wear that might be more satisfactory for her, with the understanding that      she would have to purchase this and that Medicare has already paid for     the shoes that she is not using.  In addition, she needs inserts that are     far more customized to her feet, but again, she has been given three sets     of orthotics by the previous supplier, so will not be eligible for     another set again for a year.  Hopefully, the consultation with [employer]     will be useful to her and will provide her with some ideas for future     care.  6. Meanwhile, return visit will be to this clinic on a p.r.n. basis.                                               Jonelle Sports. Cheryll Cockayne, M.D.    RES/MEDQ  D:  06/24/2003  T:  06/26/2003  Job:  [id number]   cc:   Milus Mallick. Lodema Hong, M.D.  [address]  [place], [place] [address]  Fax: [phone]

## 2010-05-19 NOTE — Assessment & Plan Note (Signed)
[facility]                          [facility]   NAME:[name] [name], [name] [name]                         MRN:          [id number]  DATE:03/13/2006                            DOB:          April 30, 1929    Ms. Swantek is seen for followup.  Cardioversion has been done.  I will  have my dictation printed out shortly.  Cardioversion was quite straight  forward as an outpatient at [facility] and she is holding sinus  rhythm.  She is feeling well and not having any particular problems.  She has had some slight shortness of breath, but nothing that sounds  like a significant problem.  She has also had some slight edema and this  is no longer present either.   ALLERGIES:  SULFA, ZOLOFT, XANAX, PRINIVIL and METOPROLOL at a higher  dose.   MEDICATIONS:  1. Crestor.  2. Allegra.  3. Diltiazem ER 240 in the morning and a 180 in the evening.  4. Glyburide.  5. Vitamin E.  6. Fish oil.  7. Flax oil.  8. Calcium.  9. Benazepril.  10.Hydrochlorothiazide.  11.Metoprolol 25.  12.Lexapro 10.  13.Tricor 145.  14.Coumadin 5.  15.Aspirin 81.   OTHER MEDICAL PROBLEMS:  See the list below.   REVIEW OF SYSTEMS:  Other than the HPI, her review of systems is  negative.   PHYSICAL EXAMINATION:  VITAL SIGNS:  Blood pressure today 119/70 with a  pulse of 54.  The weight is 192 pounds.  GENERAL:  The patient is oriented to person, time and place.  Affect is  normal.  HEENT:  There is no xanthelasma. There is normal extraocular motion.  NECK:  There are no carotid bruits.  There is no jugular venous  distention.  LUNGS:  Are clear. Respiratory effort is not labored.  CARDIAC:  Exam reveals an S1 with an S2.  There is no clicks or  significant murmurs.  ABDOMEN:  Is soft.  There are no masses or bruits.  EXTREMITIES:  She has no significant peripheral edema at this time.   TEST DATA:  EKG reveals sinus rhythm.   PROBLEMS:  Include:  1. Atrial flutter  that was asymptomatic of unknown duration.  She has      now undergone cardioversion successfully and we will continue her      current medicines including Coumadin.  I will see her back in 3      months.  2. Normal left ventricular function.  3. Status post bilateral ethmoidectomy surgery.  4. Cardiac risk factors but a negative adenosine stress study in      September 2007.   The patient is stable.  She will remain on her current meds. I will see  her back in 3 months.  She is to watch her salt intake.  I hope that we  can move her INR checks back to once monthly.     Luis Abed, MD, [facility]  Electronically Signed    JDK/MedQ  DD: 03/13/2006  DT: 03/15/2006  Job #: [id number]   cc:   Claris Che  Diana Eves, M.D.

## 2010-05-19 NOTE — Assessment & Plan Note (Signed)
[facility]                          [facility]   NAME:Shippey, [name] [name]                         MRN:          [id number]  DATE:01/22/2006                            DOB:          17-Aug-1929    [name] is here for followup.  We keep trying to get her INR  stabilized so that we can proceed with cardioversion.  She had done well  and then her INR dipped.  She is healing.  We do not have the result  from January the 4th.  After that, her INR was high at 3.6, and her dose  was cut back slightly, and her INR today is 4.1.  Her dose will be held  today, and she will then be on 1.25 of Coumadin twice a week, and 2.5 on  the other days.  We will follow her weekly, and when we can have 3 weeks  in a row with her well anticoagulated, we will arrange for  cardioversion.  If it is a time that I am not available, we will ask Dr.  Andee Lineman to proceed, as this has taken a long time for [place] to try to get  back the sinus rhythm.   The patient is going about full activities.  She is not having any chest  pain, syncope, or presyncope.   PAST MEDICAL HISTORY:   ALLERGIES:  Wilfred Lacy, ZOLOFT AND PRINIVIL.   MEDICATIONS:  1. Crestor.  2. Allegra.  3. Diltiazem.  4. Glyburide.  5. Fish oil.  6. Vitamin C.  7. Benazepril.  8. Hydrochlorothiazide.  9. Metoprolol.  10.Lexapro.  11.Tricor.   She is taking some pain medicines, and this may effect her INR.   OTHER MEDICAL PROBLEMS:  See the complete list on my note of December 02, 2005.   REVIEW OF SYSTEMS:  She is actually doing well at this time, and her  review of systems is negative.   PHYSICAL EXAMINATION:  Blood pressure today is recorded in the chart and  previously reviewed.  LUNGS:  Clear.  CARDIAC EXAM:  Reveals an S1 with an S2, but no clicks or significant  murmurs.  The patient is oriented to person, time, and place, and her affect is  normal.  HEENT:  Reveals no xanthelasma.  She  has normal extraocular motion.  There are no carotid bruits, there is no jugular venous distension.  ABDOMEN:  Soft, there are no masses or bruits.  She has no peripheral edema.   EKG is not done today.  Her rhythm is mildly irregular, and I suspect  she is still in atrial flutter.   The problems are listed in my note of November 12, 2005.  She has  asymptomatic atrial flutter.  We will continue to try to have her  anticoagulated very carefully, getting ready for cardioversion.     Luis Abed, MD, [facility]  Electronically Signed    JDK/MedQ  DD: 01/22/2006  DT: 01/22/2006  Job #: [id number]   cc:   Milus Mallick. Lodema Hong, M.D.

## 2010-05-19 NOTE — H&P (Signed)
NAME:  [name] [name], [name] [name]                            ACCOUNT NO.:  [id number]   MEDICAL RECORD NO.:  [id number]                   PATIENT TYPE:  OBV   LOCATION:  A307                                 FACILITY:  [facility]   PHYSICIAN:  Elliot Cousin, M.D.                 DATE OF BIRTH:  03/17/1929   DATE OF ADMISSION:  06/02/2002  DATE OF DISCHARGE:                                HISTORY & PHYSICAL   CHIEF COMPLAINT:  Low back pain, bilateral hip pain, and left lower quadrant  abdominal pain.   HISTORY OF PRESENT ILLNESS:  The patient is a 77 year old white woman with a  past medical history significant for severe degenerative joint disease of  her cervical and lumbar spine, status post bilateral total knee replacement,  generalized arthritis, type 2 diabetes mellitus, and a history of melanoma,  who presented to the emergency department this morning with a three-day  history of low back pain, primarily, and secondarily, bilateral hip pain and  left lower quadrant abdominal pain.  The patient had been seen by her  chiropractor, Dr. Chestine Spore, over the past three weeks for what was diagnosed as  bilateral hip bursitis.  The patient has had a series of ultrasound  treatments and manipulations on her hips bilaterally.  The patient also  states that she has been working out recently on the treadmill and with  other exercise equipment over the past week.  The patient denies any trauma  to her back or hips.  She denies any traumatic falls.  The pain started  primarily on the left lower back, which radiated around to the left side of  her abdomen and across to the right side of her abdomen.  She had no  radiation of the pain down her legs.  She has no associated numbness or  tingling or weakness in her legs.  She had no loss of control of her bladder  or bowels.  The pain which started three days ago is described as sharp.  The intensity was 9/10 this morning.  It is currently 3-4/10 after a series  of  pain medications.  Sitting or walking make the pain worse and lying flat  on her back helps to relieve the pain.  She has taken hydrocodone once or  twice each day over the past two to three days, but it did not help.   She has also had bilateral hip pain, greater in the left hip over the past  two weeks, for which she has been receiving treatments from her  chiropractor.  She says the hip pain is much better now.  She has also had  some associated constipation and left lower quadrant pain.  She ranks the  left lower quadrant abdominal pain as a 3-4/10, which helped a little after  she had a bowel movement yesterday.   When the patient was seen in the  emergency department, she was given 100 mg  of Demerol IM and 25 mg of Phenergan IM.  She states that the medications  did not help at all and she was in too much pain to go home.  The patient  will therefore be admitted for further evaluation and management of moderate-  to-severe low back pain.   REVIEW OF SYSTEMS:  The patient's review of systems is positive for  constipation.  Her review of systems is negative for headache, visual  changes, nausea, vomiting, diarrhea, chest pain, shortness of breath,  painful urination or swelling in her legs.   PAST MEDICAL HISTORY:  1. 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     a. Status post excision of cervical nodes x2 (one node was positive).     b. Status post plastic surgery of the left lateral neck.  2. History of basal cell carcinoma of the left neck and right posterior     thigh in 2002.  3. Type 2 diabetes mellitus diagnosed in September of 2003.  4. Hypertension.  5. Hypothyroidism.  6. Hyperlipidemia.  7. Depression.  8. Anxiety.  9. Status post hysterectomy in 2001 secondary to dysfunctional uterine     bleeding.  10.      Status post right total knee replacement in 2002.  11.      Status post left total knee replacement in  2000.  12.      Status post bilateral cataract removal in December of 2000.  13.      Status post neuroma excision of the left foot in 1972.  14.      Chronic low back pain with MRI in 1999 revealing degenerative joint     disease and bilateral hypertrophy of the facets of the lumbar spine.  15.      Fatty liver per ultrasound and CT scans in January and April of     2003.  16.      History of diverticulosis per CT scan in April of 2003.  17.      Irritable bowel syndrome.   CURRENT MEDICATIONS:  1. Lipitor 80 mg q.h.s.  2. Tricor 160 mg daily.  3. Lotrel 10/20 mg daily.  4. Hydrochlorothiazide 25 mg daily.  5. Toprol-XL 200 mg daily.  6. Premarin 0.625 mg daily.  7. Allegra 180 mg daily.  8. Vioxx 25 mg daily.  9. Aspirin 81 mg daily.  10.      Lidoderm patches, use as directed.  11.      Hydrocodone/acetaminophen 10/500 mg daily p.r.n.  12.      Valium 5 mg daily p.r.n.  13.      Ativan 1 mg daily p.r.n.  14.      Wellbutrin SR 150 mg daily.  15.      Bentyl 20 mg q.6h. as needed.  16.      Glucovance 2.5/500 mg daily.  17.      Multivitamins daily.  18.      Calcium with vitamin D 500 mg t.i.d.   ALLERGIES:  1. SULFA.  2. Prudy Feeler.  3. PRINIVIL.  4. PAXIL.  5. ZOLOFT.   FAMILY HISTORY:  Her father died at 1 years of age secondary to multiple  myeloma and heart disease.  Her mother died at 59 years of age secondary to  complications from Alzheimer's disease.  She has one brother who was  murdered  at 39 years of age.  She has three other siblings, two with  osteoarthritis and one with diabetes and one with hyperlipidemia.  She has  one healthy child.   SOCIAL HISTORY:  The patient moved from [place] with her husband approximately  one year ago.  She retired from [employer].  [place]  [place].  She has one grown son.  She has some college education.  She can read and write.  She  denies any tobacco  use.  She drinks alcohol socially once a week.   PHYSICAL EXAMINATION:  VITAL SIGNS:  Temperature 98.7, pulse 74, respiratory  rate 20, blood pressure 134/76, oxygen saturation on room air 98%.  GENERAL:  The patient is a pleasant 77 year old Caucasian woman who is  currently lying in bed in no acute distress.  She is well-nourished, perhaps  slightly obese.  HEENT:  Head is normocephalic and nontraumatic.  Pupils are equal, round and  reactive to light.  Extraocular movements are intact.  Conjunctivae are  clear.  Sclerae are white.  No proptosis or ptosis are noted.  Oropharynx  reveals good dentition with moist mucous membranes.  No posterior exudates  or erythema.  Nasal mucosa is moist without any drainage.  No sinus  tenderness.  NECK:  Neck supple.  No adenopathy.  No thyromegaly.  She does have a well-  healed, mildly reddened chronic scar on the base of her neck posteriorly.  She also has some mild, faint, well-healed scarring of the left lateral neck  as well.  BREASTS:  Bilaterally they are soft without any masses palpated.  No  axillary adenopathy.  ABDOMEN:  Abdomen is obese.  Positive bowel sounds.  Soft, nondistended.  Only minimal tenderness of the left lower quadrant.  No hepatosplenomegaly.  No rebound.  RECTAL:  Rectal is deferred (rectal per Dr. Hilario Quarry, emergency room  physician, performed a rectal exam which revealed no lesion and stool that  was guaiac negative).  GU:  Deferred.  EXTREMITIES/MUSCULOSKELETAL:  The patient has no peripheral edema.  Her  pedal pulses are 2+ bilaterally.  She does have arthritic changes of both  knees secondary to bilateral total knee replacements.  She has well-healed  scars on both knees.  There is some crepitus palpated bilaterally.  She has  no knee tenderness or redness bilaterally.  No tenderness to palpation of  her hips bilaterally.  She has good range of motion at her knees and also at  her hips bilaterally.  She  does have some mild-to-moderate tenderness to  palpation and percussion of the left-greater-than-right lumbosacral muscles  with minimal spasms (the patient received several doses of morphine and  Toradol).  NEUROLOGICAL:  The patient is alert and oriented x3.  Cranial nerves II-XII  are intact.  The patient's strength, upper extremities bilaterally, is 5/5.  Her strength of the lower extremities bilaterally is 5-/5 proximally and 5/5  distally.  Patellar reflexes were 2+.  Achilles reflexes were 2+.  Sensation  throughout was within normal limits.  Gait:  The patient was able to stand  under her own power and take a few steps, although they were very purposeful  steps.   ADMISSION LABORATORY DATA:  Urinalysis completely normal.  Lipase 45.  Sodium 134, potassium 3.8, chloride 100, CO2 26, glucose 197, BUN 20,  creatinine 0.8, calcium 9.6.  WBC 9.4, hemoglobin 13.9, hematocrit 39.9, MCV 89.8, platelets 276,000.   CT scan  of the abdomen and pelvis revealed no acute inflammatory process  seen, bibasilar atelectasis, tiny nonspecific lung nodule, right lung base,  status post hysterectomy, appendix not visualized, neither ovary seen,  increased stool in the distal colon.   Radiograph of the lumbar spine:  The results revealed diffuse degenerative  disk disease changes, multi-level disk space narrowing, endplate sclerosis  and spur formation seen.  Vacuum disk phenomena present at L1 to L2, L2 to  L3 and L4 to L5.  No spondylosis.   ASSESSMENT:  1. Moderate-to-severe low back pain.  The patient recently had manipulations     performed by her chiropractor and she also recently increased her     activity with exercising.  These are probable and potential causes for     the patient's exacerbation.  The patient has a long history of     degenerative disk disease of her low back.  She has a history of     receiving multiple steroid injections and epidural injections in the     distant past.   She currently has no neurological findings on exam.  She     has been given Demerol, morphine and Toradol, which have helped to     subside her pain substantially.  As noted on the lumbar radiographs, the     patient continues to have diffuse degenerative changes of her lumbar     spine.  2. Recent constipation.  3. Recent left lower quadrant pain.  The pain, which is only minimal now, is     probably secondary to her history of diverticulosis and perhaps the     constipation she expressed during the history and physical and also seen     on the CT scan.  4. Type 2 diabetes mellitus.  5. Hypertension.  6. Hyperlipidemia.  7. Right lung base tiny nodule thought to be a granuloma.  The patient     states that this is chronic.   PLAN:  1. Treatment started in the emergency department when the patient was given     Demerol and Phenergan.  2. Treatment will continue with morphine 3 to 4 mg IV q.3h. p.r.n. pain,     Phenergan 12.5 mg to 25 mg IV q.4h. as needed for nausea, and Toradol 30     mg IV q.6h. x48 hours.  3. Will obtain an MRI of the lumbar spine for further evaluation.  4. Will treat with twice-daily to three-times-daily dosing of stool     softener/laxatives.  5. Will continue all home medications for hypertension, hyperlipidemia and     diabetes mellitus.  6. Will obtain a thyroid function test.  7. Will obtain A1c fasting lipid panel.  8. The patient will be admitted for 24 to 40 hours' observation.   ADDENDUM:  The patient's rheumatologist is Dr. Pollyann Savoy.  The  patient's orthopedic physician is Dr. Elana Alm. Wainer.                                                Elliot Cousin, M.D.    DF/MEDQ  D:  06/02/2002  T:  06/02/2002  Job:  [id number]

## 2010-05-19 NOTE — Assessment & Plan Note (Signed)
[facility]                            [facility] NOTE   NAME:Chipman, JOSSLYNN MENTZER                         MRN:          [id number]  DATE:11/12/2005                            DOB:          Jan 22, 1929    PRIMARY CARDIOLOGIST:  Luis Abed, MD, [facility]   REASON FOR VISIT:  Scheduled post hospital follow up.   HISTORY OF PRESENT ILLNESS:  Mrs. Magnone returns after undergoing successful  nasal surgery with a bilateral ethmoidectomy performed on October 15, 2005,  here at [facility].  We have been following her closely prior to this  surgery, after she was initially referred to Dr. Myrtis Ser on September 20 with  asymptomatic atrial flutter of unknown duration.   The patient had an uncomplicated surgery and has resumed low dose aspirin.  She reports today that she had had some intermittent bloody nasal discharge,  but this has since resolved over the past week or so.  She also has resumed  Lopressor, which she had placed on hold several months prior to her initial  clinic visit, after presenting with a blood pressure of 182/100, during a  recent follow up visit.  She reports that since doing so, she has felt  better and that her episodes of palpitations have also diminished  significantly.   As in the past, the patient reports no chest pain or exertional dyspnea.  She has no known history of coronary artery disease and has had a recent  normal 2D echocardiogram and a non-ischemic adenosine stress test.   Electrocardiogram today reveals atrial flutter at 58 BPM.   CURRENT MEDICATIONS:  1. Crestor 40 mg daily.  2. Aspirin 81 mg daily.  3. Diltiazem ER 420 daily.  4. Glyburide 2.5/500 b.i.d.  5. Fish oil 1000 daily.  6. Benazepril HCTZ 40/25 daily.  7. Metoprolol 25 daily.  8. Lexapro 10 daily.  9. Tri-Chlor 145 mg daily.   PHYSICAL EXAMINATION:  VITAL SIGNS:  Blood pressure 120/58, pulse 58 and  regular, weight 188.  GENERAL:  A  77 year old female, sitting upright in no distress.  NECK:  No JVD.  LUNGS:  Clear to auscultation in all fields.  HEART:  Irregularly irregular (S1, S2).  No significant murmurs.  ABDOMEN:  Protuberant, nontender.  EXTREMITIES:  No significant edema.  NEUROLOGICAL:  No focal deficits.   IMPRESSION:  1. Asymptomatic atrial flutter, of unknown duration.  2. Normal left ventricular function.  3. Status post recent bilateral ethmoidectomy surgery, uncomplicated.  4. Multiple cardiac risk factors, negative Adenosine stress Cardiolite      September 2007.   PLAN:  Following discussion with Dr. Myrtis Ser, recommendation is to initiate  Coumadin anticoagulation with the intention of attempting DC cardioversion  in approximately 4 weeks, following close monitoring of her pro times here  in our Coumadin clinic.  We will therefore start her at 5 mg daily and check  her pro time on a weekly basis.  Following successful cardioversion, she  will need to continue for an additional four weeks.   No medication adjustments are recommended at this  time.  The patient is  agreeable with this plan, and we will arrange for her to return in  approximately three weeks for continued close follow up.      Rozell Searing, PA-C  Electronically Signed      Luis Abed, MD, [facility]  Electronically Signed   GS/MedQ  DD: 11/12/2005  DT: 11/12/2005  Job #: [id number]   cc:   Milus Mallick. Lodema Hong, M.D.

## 2010-05-19 NOTE — Assessment & Plan Note (Signed)
[facility]                          [facility] NOTE   NAME:Wolfman, DAMARY DOLAND                         MRN:          [id number]  DATE:12/13/2005                            DOB:          [birth year]    I saw her last on November 12, 2005.  At that time, we decided to start  her on Coumadin.  Her INRs have been on the high side and we have been  carefully bringing them down.  Most recently, her INR was in the range  of 3.6, and on 5 of Coumadin 2 days and 2.5 the other days, her INR went  slightly higher.  WITH this in mind, we will hold her Coumadin for 3  days and then restart her at 2.5 daily.  I believe that this may be a  stable dose for her, but it will be checked in 1 week.   To be sure, a repeat EKG was done today and she remains in atrial  flutter.   Overall, she is doing well.  She is not having any major problems.  She  has not had any chest pain, shortness of breath, syncope, or pre-  syncope.   PAST MEDICAL HISTORY:   ALLERGIES:  SULFA, Prudy Feeler, ZOLOFT, and PRINVIL.   MEDICATIONS:  Crestor, Allegra, diltiazem, glyburide, fish oil,  vitamins, calcium, benazepril, hydrochlorothiazide, metoprolol 25,  Lexapro 10, TriCor 145.   OTHER MEDICAL PROBLEMS:  See the complete list on my note of November 12, 2005.   REVIEW OF SYSTEMS:  She is actually doing well and her review of systems  is stable.   PHYSICAL EXAM:  Blood pressure is 102/68 with a pulse of 80.  Her rhythm  is regular.  Her weight is 190 pounds.  This is up 2 pounds.  HEENT:  No xanthelasma.  She has normal extraocular motion.  LUNGS:  Clear.  Respiratory effort is not labored.  CARDIAC:  S1 with an S2.  There are no clicks or significant murmurs.  She has no significant peripheral edema.  EKG shows that she is still in  atrial flutter.   PROBLEMS:  Include those listed on my note of November 12, 2005.  1. Asymptomatic atrial flutter of unknown duration.  2. Normal  left ventricular function.  3. Status post recent sinus surgery.  4. Negative adenosine Cardiolite despite risk factors.   We will continue to adjust her Coumadin carefully.  Then I will see her  back and we will then make further plans for her cardioversion.     Luis Abed, MD, [facility]  Electronically Signed    JDK/MedQ  DD: 12/13/2005  DT: 12/13/2005  Job #: [id number]   cc:   Milus Mallick. Lodema Hong, M.D.

## 2010-06-09 ENCOUNTER — Encounter: Payer: Self-pay | Admitting: Family Medicine

## 2010-06-09 ENCOUNTER — Other Ambulatory Visit: Payer: Self-pay | Admitting: Family Medicine

## 2010-06-09 MED ORDER — ROSUVASTATIN CALCIUM 40 MG PO TABS: 40.0000 mg | ORAL_TABLET | Freq: Every day | ORAL | Status: DC

## 2010-06-12 ENCOUNTER — Telehealth: Payer: Self-pay | Admitting: Family Medicine

## 2010-06-12 NOTE — Telephone Encounter (Signed)
Advised patient ER or Urgent care. States she went to ER [facility] yesterday. States pain in left side, and has been having loose stools and increased urination. States legs look like skin and bones. UA and CT scan done in ER and found nothing.

## 2010-06-12 NOTE — Telephone Encounter (Signed)
pls work in pt if an opening comes up , let her know what's going, and if the pain is as bad as she say, I advise rept ed eval, and suggest she go to [facility], I do not have quick access to [employer], she can also contact either ortho or the pain clinic to see if they can se her sooner, sorry she does not feel well

## 2010-06-12 NOTE — Telephone Encounter (Signed)
Patient is aware  Told her that we would call if someone cancels

## 2010-06-12 NOTE — Telephone Encounter (Signed)
She does not go to a pain clinic  What to do about that she is in  Pain. Also Dr. Claybon Jabs is out all week

## 2010-06-13 ENCOUNTER — Encounter: Payer: Self-pay | Admitting: Family Medicine

## 2010-06-13 ENCOUNTER — Ambulatory Visit (INDEPENDENT_AMBULATORY_CARE_PROVIDER_SITE_OTHER): Payer: Medicare Other | Admitting: Family Medicine

## 2010-06-13 VITALS — BP 160/90 | HR 114 | Resp 16 | Ht 62.0 in | Wt 169.0 lb

## 2010-06-13 DIAGNOSIS — F3289 Other specified depressive episodes: Secondary | ICD-10-CM

## 2010-06-13 DIAGNOSIS — F329 Major depressive disorder, single episode, unspecified: Secondary | ICD-10-CM

## 2010-06-13 DIAGNOSIS — M25552 Pain in left hip: Secondary | ICD-10-CM | POA: Insufficient documentation

## 2010-06-13 DIAGNOSIS — E785 Hyperlipidemia, unspecified: Secondary | ICD-10-CM

## 2010-06-13 DIAGNOSIS — E119 Type 2 diabetes mellitus without complications: Secondary | ICD-10-CM

## 2010-06-13 DIAGNOSIS — I10 Essential (primary) hypertension: Secondary | ICD-10-CM

## 2010-06-13 DIAGNOSIS — M25559 Pain in unspecified hip: Secondary | ICD-10-CM

## 2010-06-13 NOTE — Patient Instructions (Signed)
F/U in 6 to 8 weeks  I believe that your problem is in the left hip since you are tender there.  I will refer you to Dr Farris Has for further management.asap  pls ensure you drink  Sufficient water you were mildly dehydrated  No additional pain med at this time pls be careful not to fall, safety is a big issue

## 2010-06-13 NOTE — Progress Notes (Signed)
  Subjective:    Patient ID: [name] [name], female    DOB: 06-23-1929, 77 y.o.   MRN: [id number]  HPI Pt has been having left hip and buttock pain x 4 days, started as a twinge, no aggravating factor noted and escalatd too a 10 plus were it has remained. She has been to ED and urgent care, no relief. Feels light headed also, states she has been prescribed tramadol and a muscle relaxant, advise  Caution about adding these to her chronic pain meds, she has not yet filled the tramadol and I have advised her not to. HYPERTENSION Disease Monitoring Blood pressure range-unknown Chest pain- no      Dyspnea- no Medications Compliance- good Lightheadedness- yes   Edema- no   DIABETES Disease Monitoring Blood Sugar ranges-200's Polyuria- no New Visual problems- no Medications Compliance- fair Hypoglycemic symptoms- no   HYPERLIPIDEMIA Disease Monitoring See symptoms for Hypertension Medications Compliance- good RUQ pain- no  Muscle aches- no      Review of Systems Denies recent fever or chills.c/o uncontrolled pain , fatigue and weakness Denies sinus pressure, nasal congestion, ear pain or sore throat. Denies chest congestion, productive cough or wheezing. Denies chest pains, palpitations, paroxysmal nocturnal dyspnea, orthopnea and leg swelling Reports light headedness with change in position Denies abdominal pain, nausea, vomiting,diarrhea or constipation.  Denies rectal bleeding or change in bowel movement. Denies dysuria, frequency, hesitancy or incontinence. Denies headaches, seizure, numbness, or tingling. Denies depression,is experiencing  anxiety  And insomnia due to pain Denies skin break down or rash.        Objective:   Physical Exam Patient alert and oriented and in no Cardiopulmonary distress.Pt in obvious pain, looks weak, unsteady gait and limps  HEENT: No facial asymmetry, EOMI, no sinus tenderness,  Oropharynx pink and moist.  Neck supple no adenopathy.  Chest:  Clear to auscultation bilaterally.  CVS: S1, S2 no murmurs, no S3.  ABD: Soft non tender. Bowel sounds normal.  Ext: No edema  MS: Tender over left SI joint, reduced ROM LS spine Skin: Intact, no ulcerations or rash noted.  Psych: Good eye contact, normal affect. Mildly  anxious  And depressed appearing.  CNS: CN 2-12 intact,        Assessment & Plan:

## 2010-06-14 NOTE — Assessment & Plan Note (Signed)
Currently experiencing inc anxiety and depression due to pain, generally controled on med

## 2010-06-14 NOTE — Assessment & Plan Note (Signed)
Pt reports recent increase in blood sugars around 200, however she had an epidural 1 week ago

## 2010-06-14 NOTE — Assessment & Plan Note (Signed)
uncontroled at this visit, likely related to srtess and uncontrolled pain

## 2010-06-14 NOTE — Assessment & Plan Note (Signed)
Controlled, no change in medication  

## 2010-06-14 NOTE — Assessment & Plan Note (Signed)
Debilitating hip pain with point tenderness, recently had normal abdominal and pelvic scan, had epidural 1 week ago for disc disease, needs ortho eval

## 2010-06-15 ENCOUNTER — Ambulatory Visit: Payer: Medicare Other | Admitting: Family Medicine

## 2010-06-27 LAB — HEMOGLOBIN A1C
Hgb A1c MFr Bld: 9.1 % — ABNORMAL HIGH (ref <5.7)
Mean Plasma Glucose: 214 mg/dL — ABNORMAL HIGH (ref <117)

## 2010-07-04 ENCOUNTER — Other Ambulatory Visit: Payer: Self-pay | Admitting: Family Medicine

## 2010-07-04 ENCOUNTER — Telehealth: Payer: Self-pay | Admitting: Family Medicine

## 2010-07-04 MED ORDER — INSULIN GLARGINE 100 UNIT/ML ~~LOC~~ SOLN: 20.0000 [IU] | Freq: Every day | SUBCUTANEOUS | Status: DC

## 2010-07-04 NOTE — Telephone Encounter (Signed)
I will be prescribing lantus 20units at bedtime, she is to be taught to start with 7 units, and titrate up by 3 units  Every 5 days if needed, for average fasting sugars over 130 days . I dio not believe she will need as much as 20 units , this is just so she has enough. I will enter the script , pls send to appropraite pharmacy, uncertain if she wants mail order, if so the quantity dispensed will need to be adjusted for 3 month supply

## 2010-07-04 NOTE — Telephone Encounter (Signed)
Med sent, patient awawe

## 2010-07-04 NOTE — Telephone Encounter (Signed)
Med sent, patient aware, will come in for insulin training on friday

## 2010-07-24 ENCOUNTER — Other Ambulatory Visit: Payer: Self-pay | Admitting: Family Medicine

## 2010-07-25 ENCOUNTER — Other Ambulatory Visit: Payer: Self-pay | Admitting: Family Medicine

## 2010-08-02 ENCOUNTER — Encounter: Payer: Self-pay | Admitting: Family Medicine

## 2010-08-03 ENCOUNTER — Other Ambulatory Visit: Payer: Self-pay | Admitting: Family Medicine

## 2010-08-03 ENCOUNTER — Other Ambulatory Visit: Payer: Self-pay | Admitting: *Deleted

## 2010-08-03 ENCOUNTER — Encounter: Payer: Self-pay | Admitting: Family Medicine

## 2010-08-03 MED ORDER — HYDROCODONE-ACETAMINOPHEN 10-500 MG PO TABS: 1.0000 | ORAL_TABLET | Freq: Four times a day (QID) | ORAL | Status: DC | PRN

## 2010-08-04 ENCOUNTER — Encounter: Payer: Self-pay | Admitting: Family Medicine

## 2010-08-04 ENCOUNTER — Ambulatory Visit (INDEPENDENT_AMBULATORY_CARE_PROVIDER_SITE_OTHER): Payer: Medicare Other | Admitting: Family Medicine

## 2010-08-04 VITALS — BP 140/70 | HR 71 | Resp 16 | Ht 62.0 in | Wt 171.8 lb

## 2010-08-04 DIAGNOSIS — B3789 Other sites of candidiasis: Secondary | ICD-10-CM

## 2010-08-04 DIAGNOSIS — IMO0002 Reserved for concepts with insufficient information to code with codable children: Secondary | ICD-10-CM

## 2010-08-04 DIAGNOSIS — E119 Type 2 diabetes mellitus without complications: Secondary | ICD-10-CM

## 2010-08-04 DIAGNOSIS — R32 Unspecified urinary incontinence: Secondary | ICD-10-CM | POA: Insufficient documentation

## 2010-08-04 DIAGNOSIS — I10 Essential (primary) hypertension: Secondary | ICD-10-CM

## 2010-08-04 DIAGNOSIS — E785 Hyperlipidemia, unspecified: Secondary | ICD-10-CM

## 2010-08-04 DIAGNOSIS — Z13220 Encounter for screening for lipoid disorders: Secondary | ICD-10-CM

## 2010-08-04 DIAGNOSIS — R5383 Other fatigue: Secondary | ICD-10-CM

## 2010-08-04 DIAGNOSIS — B3749 Other urogenital candidiasis: Secondary | ICD-10-CM

## 2010-08-04 DIAGNOSIS — R5381 Other malaise: Secondary | ICD-10-CM

## 2010-08-04 MED ORDER — HYDROCODONE-ACETAMINOPHEN 10-500 MG PO TABS: 1.0000 | ORAL_TABLET | Freq: Four times a day (QID) | ORAL | Status: DC | PRN

## 2010-08-04 MED ORDER — FENOFIBRATE 145 MG PO TABS: 145.0000 mg | ORAL_TABLET | Freq: Every day | ORAL | Status: DC

## 2010-08-04 MED ORDER — CITALOPRAM HYDROBROMIDE 40 MG PO TABS: ORAL_TABLET | ORAL | Status: DC

## 2010-08-04 MED ORDER — MELOXICAM 15 MG PO TABS: 15.0000 mg | ORAL_TABLET | Freq: Every day | ORAL | Status: DC

## 2010-08-04 MED ORDER — NIACIN ER (ANTIHYPERLIPIDEMIC) 500 MG PO TBCR: EXTENDED_RELEASE_TABLET | ORAL | Status: DC

## 2010-08-04 MED ORDER — NYSTATIN 100000 UNIT/GM EX POWD: CUTANEOUS | Status: AC

## 2010-08-04 MED ORDER — SOLIFENACIN SUCCINATE 10 MG PO TABS: 10.0000 mg | ORAL_TABLET | Freq: Every day | ORAL | Status: DC

## 2010-08-04 NOTE — Progress Notes (Signed)
  Subjective:    Patient ID: [name] [name], female    DOB: Jul 19, 1929, 77 y.o.   MRN: [id number]  HPI [place] urinary frequency , wears a diaper at night , in the past 3 months Primarily here to review uncontrolled blood sugars with recent start of insulin  Rash under breasts x 6 to 8 months, getting worse   Pt fell recently hurt her right arm which is bruised, seeing chiropracter  HYPERTENSION  Disease Monitoring  Blood pressure range-120 to 130/70 Chest pain- no      Dyspnea- no  Compliance- good Lightheadedness- no   Edema- no  DIABETES  Disease Monitoring  Blood Sugar ranges-fasting blood sugars often under 200, since starting insullin, , now 176 and is on 14 unitsNew Visual problems- no  Compliance- good Hypoglycemic symptoms- no  Last eye exam: within the past 12 months      Podiatry visit: occasional  HYPERLIPIDEMIA  Compliance- good  RUQ pain- no  Muscle aches- no   REGULAR EXERCISE-no      Review of Systems See HPI Denies recent fever or chills. Denies sinus pressure, nasal congestion, ear pain or sore throat. Denies chest congestion, productive cough or wheezing. Denies chest pains, palpitations and leg swelling Denies abdominal pain, nausea, vomiting,diarrhea or constipation.   Denies dysuria, frequency, hesitancy or incontinence.  Denies headaches, seizures, numbness, or tingling. Denies depression, anxiety or insomnia.        Objective:   Physical Exam Patient alert and oriented and in no cardiopulmonary distress.  HEENT: No facial asymmetry, EOMI, no sinus tenderness,  oropharynx pink and moist.  Neck supple no adenopathy.  Chest: Clear to auscultation bilaterally.  CVS: S1, S2 no murmurs, no S3.  ABD: Soft non tender. Bowel sounds normal.  Ext: No edema  NW:[id number]  ROM spine, shoulders, hips and knees.  Skin: candidiasis under breasts and in groin  Psych: Good eye contact, normal affect. Memory fair not anxious or depressed  appearing.  CNS: CN 2-12 intact, power, tone and sensation normal throughout.        Assessment & Plan:

## 2010-08-04 NOTE — Patient Instructions (Addendum)
F/u in early October.  Fastin labs 3 to 5 days before next visit  LABWORK  NEEDS TO BE DONE BETWEEN 3 TO 7 DAYS BEFORE YOUR NEXT SCEDULED  VISIT.  THIS WILL IMPROVE THE QUALITY OF YOUR CARE.   Med is ssent in for inconntinence.  Sugars are better, increase to 20 units lantus and follow diet.  pls start regular exercises to build muscle

## 2010-08-06 NOTE — Assessment & Plan Note (Signed)
Deteriorated, medication prescribed 

## 2010-08-06 NOTE — Assessment & Plan Note (Signed)
Improved since last visit, no intervention has been done in the interim

## 2010-08-06 NOTE — Assessment & Plan Note (Signed)
Improved, now that pt is on insulin, and she is comfortable seld administering her medication, HBA1C prior to next visit

## 2010-08-06 NOTE — Assessment & Plan Note (Signed)
Controlled, no change in medication  

## 2010-08-06 NOTE — Assessment & Plan Note (Signed)
Deteriorated, pt to do strengthening exercises daily as well as to start medication

## 2010-08-25 ENCOUNTER — Encounter (HOSPITAL_COMMUNITY): Payer: Self-pay | Admitting: *Deleted

## 2010-08-25 ENCOUNTER — Emergency Department (HOSPITAL_COMMUNITY)
Admission: EM | Admit: 2010-08-25 | Discharge: 2010-08-25 | Disposition: A | Discharge status: Home / Self Care | Payer: Medicare Other | Attending: Emergency Medicine | Admitting: Emergency Medicine

## 2010-08-25 DIAGNOSIS — K5730 Diverticulosis of large intestine without perforation or abscess without bleeding: Secondary | ICD-10-CM | POA: Insufficient documentation

## 2010-08-25 DIAGNOSIS — E7800 Pure hypercholesterolemia, unspecified: Secondary | ICD-10-CM | POA: Insufficient documentation

## 2010-08-25 DIAGNOSIS — E785 Hyperlipidemia, unspecified: Secondary | ICD-10-CM | POA: Insufficient documentation

## 2010-08-25 DIAGNOSIS — M549 Dorsalgia, unspecified: Secondary | ICD-10-CM | POA: Insufficient documentation

## 2010-08-25 DIAGNOSIS — I059 Rheumatic mitral valve disease, unspecified: Secondary | ICD-10-CM | POA: Insufficient documentation

## 2010-08-25 DIAGNOSIS — K219 Gastro-esophageal reflux disease without esophagitis: Secondary | ICD-10-CM | POA: Insufficient documentation

## 2010-08-25 DIAGNOSIS — I4892 Unspecified atrial flutter: Secondary | ICD-10-CM | POA: Insufficient documentation

## 2010-08-25 DIAGNOSIS — E119 Type 2 diabetes mellitus without complications: Secondary | ICD-10-CM | POA: Insufficient documentation

## 2010-08-25 DIAGNOSIS — K589 Irritable bowel syndrome without diarrhea: Secondary | ICD-10-CM | POA: Insufficient documentation

## 2010-08-25 DIAGNOSIS — I10 Essential (primary) hypertension: Secondary | ICD-10-CM | POA: Insufficient documentation

## 2010-08-25 MED ORDER — HYDROMORPHONE HCL 1 MG/ML IJ SOLN
1.0000 mg | Freq: Once | INTRAMUSCULAR | Status: AC
  Administered 2010-08-25: 1 mg via INTRAMUSCULAR
  Filled 2010-08-25: qty 1

## 2010-08-25 MED ORDER — METHOCARBAMOL 500 MG PO TABS: 500.0000 mg | ORAL_TABLET | Freq: Two times a day (BID) | ORAL | Status: AC

## 2010-08-25 MED ORDER — HYDROCODONE-IBUPROFEN 7.5-200 MG PO TABS: 1.0000 | ORAL_TABLET | Freq: Four times a day (QID) | ORAL | Status: DC | PRN

## 2010-08-25 MED ORDER — HYDROMORPHONE HCL 2 MG PO TABS: 2.0000 mg | ORAL_TABLET | ORAL | Status: AC | PRN

## 2010-08-25 NOTE — ED Provider Notes (Signed)
Scribed for Toy Baker, MD, the patient was seen in room APA01/APA01 . This chart was scribed by Ellie Lunch. This patient's care was started at 10:31 AM.   CSN: [id number] Arrival date & time: 08/25/2010 10:15 AM  Chief Complaint  Patient presents with  . Back Pain   Patient is a 77 y.o. female presenting with back pain. The history is provided by the patient.  Back Pain  This is a chronic problem. The current episode started more than 2 days ago. The problem occurs constantly. The problem has been gradually worsening. The pain is associated with no known injury. The pain is present in the lumbar spine. The pain does not radiate. The pain is at a severity of 10/10. The pain is severe. Exacerbated by: movement. Pertinent negatives include no abdominal pain, no bowel incontinence and no bladder incontinence.   [name] [name] is a 77 y.o. female with a reported history of bulging discs and back pain presents to the Emergency Department complaining of lower back pain starting 3 days ago. Pain is above her baseline pain but similar to previous episodes of back pain. Pain does not radiate and is exacerbated by movement. Pt denies urinary or bowel incontinence. Pt takes hydrocodone, but has not received relief. Pt reports that she has an appointment in 3 days with her regular doctor and will receive an epidural injection.   Past Medical History  Diagnosis Date  . Atrial flutter febuary 2008       atrial flutter... DC Cardioversion...successful... holding sinus as of january 2011 coumadin therapy...discontinued...severe hematoma secondary to fall  . Hypertension   . Hyperlipidemia   . Hypercholesterolemia   . Mitral regurgitation 2010    mild ...echo...2010  . Obesity   . Sleep apnea   . Fatigue   . Depression with anxiety   . Visual loss     unspecified  . Basal cell carcinoma   . Hematochezia   . Hematoma     ...buttocks secondary to fall november 2009 with coumadin  coumadin stopped    . Diabetes mellitus     type 2  . Back pain     with radiculopathy  . Arthritis   . Osteoarthritis   . Acute cystitis   . GERD (gastroesophageal reflux disease)   . Diverticulosis of colon   . Constipation   . IBS (irritable bowel syndrome)   . Abdominal bloating   . Nausea   . Erosive esophagitis   . Aortic valve sclerosis     Ef 60 %...echo...july 2010   . Chronic granulomatous disease     right chest ... ct chest... novmber 2009  . Normal nuclear stress test     september ,2007...no ischemia  . Anticoagulant long-term use     Coumadin  stopped...severe hematoma  from fall  . Edema     Painful, April, 2012  . Ejection fraction     EF 60-65%, echo, April 13, 2010, normal RV function, mild mitral regurgitation    Past Surgical History  Procedure Date  . Bilateral bunion removel [id number]  . Bilaterial knee replacement     2000,2001  . Tonsillectomy 1957  . Appendectomy 1948  . Dilation and curettage of uterus 1952 and 2001  . Abdominal hysterectomy 2001  . Cataract extraction 2001    right   . Skin cancer excision   . Cataract extraction     left 2005  . Basal cell carcinoma excision 09/2009    right forarm  and back of neck and left side     Family History  Problem Relation Age of Onset  . Dementia Mother   . Heart disease Father     History  Substance Use Topics  . Smoking status: Never Smoker   . Smokeless tobacco: Not on file  . Alcohol Use: No    Review of Systems  Gastrointestinal: Negative for abdominal pain and bowel incontinence.  Genitourinary: Negative for bladder incontinence.  Musculoskeletal: Positive for back pain.  All other systems reviewed and are negative.    Physical Exam  BP 168/62  Pulse 72  Temp(Src) 98.6 F (37 C) (Oral)  Resp 16  Ht 5\' 3"  (1.6 m)  Wt 170 lb (77.111 kg)  BMI 30.11 kg/m2  SpO2 97%  Physical Exam  Constitutional: She is oriented to person, place, and time. She appears well-developed and  well-nourished.  HENT:  Head: Normocephalic.  Eyes: EOM are normal. No scleral icterus.  Neck: Neck supple.  Pulmonary/Chest: Effort normal.  Musculoskeletal: She exhibits tenderness.       No midline thoracic or lumbar spinal process tenderness.   Paraspinal muscular tenderness.  Lower extremities nerovascularly intact.   Neurological: She is alert and oriented to person, place, and time. Coordination normal.  Psychiatric: She has a normal mood and affect.   OTHER DATA REVIEWED: Nursing notes, vital signs, and past medical records reviewed.   DIAGNOSTIC STUDIES: Oxygen Saturation is 97% on room air, normal by my interpretation.    MDM:   11:58 AM Pt given pain meds and feels better, repeat neuro exam stable, by will f/u her pcp for an epidural injection in 3 days  CONDITION ON DISCHARGE: Stable  MEDICATIONS GIVEN IN THE E.D. Medications - No data to display  DISCHARGE MEDICATIONS: New Prescriptions   No medications on file   I personally performed the services described in this documentation, which was scribed in my presence. The recorded information has been reviewed and considered. Toy Baker, MD Procedures        Toy Baker, MD 08/25/10 [phone]

## 2010-08-25 NOTE — ED Notes (Signed)
Pt c/o pain in lower back since 3 days ago. Denies injury. Pt states that she has bulging discs but the pain has gotten worse than normal.

## 2010-10-05 LAB — COMPLETE METABOLIC PANEL WITH GFR
Albumin: 4.6 g/dL (ref 3.5–5.2)
CO2: 27 mEq/L (ref 19–32)
GFR, Est African American: >60 mL/min (ref >60)
GFR, Est Non African American: >60 mL/min (ref >60)
Glucose, Bld: 98 mg/dL (ref 70–99)
Potassium: 4.6 mEq/L (ref 3.5–5.3)
Sodium: 142 mEq/L (ref 135–145)
Total Protein: 7.1 g/dL (ref 6.0–8.3)

## 2010-10-05 LAB — HEMOGLOBIN A1C: Mean Plasma Glucose: 177 mg/dL — ABNORMAL HIGH (ref <117)

## 2010-10-05 LAB — LIPID PANEL: Total CHOL/HDL Ratio: 2.5 Ratio

## 2010-10-10 ENCOUNTER — Encounter: Payer: Self-pay | Admitting: Family Medicine

## 2010-10-12 ENCOUNTER — Ambulatory Visit (INDEPENDENT_AMBULATORY_CARE_PROVIDER_SITE_OTHER): Payer: Medicare Other | Admitting: Family Medicine

## 2010-10-12 ENCOUNTER — Encounter: Payer: Self-pay | Admitting: Family Medicine

## 2010-10-12 VITALS — BP 120/70 | HR 75 | Resp 16 | Ht 62.0 in | Wt 172.0 lb

## 2010-10-12 DIAGNOSIS — Z23 Encounter for immunization: Secondary | ICD-10-CM

## 2010-10-12 DIAGNOSIS — E119 Type 2 diabetes mellitus without complications: Secondary | ICD-10-CM

## 2010-10-12 DIAGNOSIS — IMO0002 Reserved for concepts with insufficient information to code with codable children: Secondary | ICD-10-CM

## 2010-10-12 DIAGNOSIS — E785 Hyperlipidemia, unspecified: Secondary | ICD-10-CM

## 2010-10-12 DIAGNOSIS — M549 Dorsalgia, unspecified: Secondary | ICD-10-CM

## 2010-10-12 DIAGNOSIS — I10 Essential (primary) hypertension: Secondary | ICD-10-CM

## 2010-10-12 DIAGNOSIS — R32 Unspecified urinary incontinence: Secondary | ICD-10-CM

## 2010-10-12 DIAGNOSIS — C4490 Unspecified malignant neoplasm of skin, unspecified: Secondary | ICD-10-CM

## 2010-10-12 MED ORDER — ROSUVASTATIN CALCIUM 40 MG PO TABS: 40.0000 mg | ORAL_TABLET | Freq: Every day | ORAL | Status: DC

## 2010-10-12 MED ORDER — TOLTERODINE TARTRATE ER 4 MG PO CP24: 4.0000 mg | ORAL_CAPSULE | Freq: Every day | ORAL | Status: DC

## 2010-10-12 MED ORDER — NIACIN ER (ANTIHYPERLIPIDEMIC) 500 MG PO TBCR: EXTENDED_RELEASE_TABLET | ORAL | Status: DC

## 2010-10-12 MED ORDER — DETROL LA 4 MG PO CP24: 4.0000 mg | ORAL_CAPSULE | Freq: Every day | ORAL | Status: DC

## 2010-10-12 MED ORDER — KETOROLAC TROMETHAMINE 60 MG/2ML IJ SOLN
60.0000 mg | Freq: Once | INTRAMUSCULAR | Status: AC
  Administered 2010-10-12: 60 mg via INTRAMUSCULAR

## 2010-10-12 NOTE — Assessment & Plan Note (Signed)
Hyperlipidemia:Low fat diet discussed and encouraged.  Deteriorated, pt needs  To reduce cheese rept labs in 3.5 months

## 2010-10-12 NOTE — Assessment & Plan Note (Signed)
Uncontrolled  toradol injection today

## 2010-10-12 NOTE — Patient Instructions (Addendum)
F/u in 3.5 months  Flu vaccine today.  Your blood sugar has improved a lot,your cholesterol is not good, you need to cut back on cheese.  Fasting lipid, cmp and EGFR  hBA1C in 3.5 month  Injection of toradol for back pain  Change in incontinence medication due to cost.  Pls contact health dept for med assistance  Pls sched your mammogram

## 2010-10-12 NOTE — Assessment & Plan Note (Signed)
Improved,no change in meds, fasting sugars often under 120

## 2010-10-12 NOTE — Assessment & Plan Note (Signed)
Controlled, no change in medication  

## 2010-10-12 NOTE — Progress Notes (Signed)
  Subjective:    Patient ID: Kathy Breach Reinard, female    DOB: 1929/06/09, 77 y.o.   MRN: [id number]  HPI The PT is here for follow up and re-evaluation of chronic medical conditions, medication management and review of any available recent lab and radiology data.  Preventive health is updated, specifically  Cancer screening and Immunization.   Questions or concerns regarding consultations or procedures which the PT has had in the interim are  addressed. The PT denies any adverse reactions to current medications since the last visit.  Increased and uncontrolled back pain. Left foot has big callus and deformity with pressure at base of great toe, needs podiatry re eval Eye exam unchanged, completed this year       Review of Systems     Objective:   Physical Exam Patient alert and oriented and in no cardiopulmonary distress.  HEENT: No facial asymmetry, EOMI, no sinus tenderness,  oropharynx pink and moist.  Neck  Adequate ROM no adenopathy.  Chest: Clear to auscultation bilaterally.  CVS: S1, S2 no murmurs, no S3.  ABD: Soft non tender. Bowel sounds normal.  Ext: No edema  VH:QIONGEXBM ROM spine, shoulders, hips and knees.  Skin: Intact, corns and calluses  Psych: Good eye contact, normal affect. Memory intact not anxious or depressed appearing.  CNS: CN 2-12 intact, power and tone  normal throughout.  Diabetic Foot Check:  Appearance - corns and  calluses Skin - no unusual pallor or redness Sensation - grossly intact to light touch Monofilament testing -  Right - Great toe, medial, central, lateral ball and posterior foot diminished Left - Great toe, medial, central, lateral ball and posterior foot diminished Pulses Left - Dorsalis Pedis and Posterior Tibia normal Right - Dorsalis Pedis and Posterior Tibia normal       Assessment & Plan:

## 2010-10-12 NOTE — Assessment & Plan Note (Signed)
vesicare worked well with incontinence, however  [place] cost iis prohibitive will attempt generic, encouraged also to check with health dept

## 2010-10-13 LAB — MICROALBUMIN / CREATININE URINE RATIO
Microalb Creat Ratio: 17.2 mg/g (ref 0.0–30.0)
Microalb, Ur: 4.15 mg/dL — ABNORMAL HIGH (ref 0.00–1.89)

## 2010-10-13 NOTE — Assessment & Plan Note (Signed)
Had removal of another lesion on her face since the last visit

## 2010-10-27 ENCOUNTER — Other Ambulatory Visit: Payer: Self-pay

## 2010-10-27 DIAGNOSIS — I10 Essential (primary) hypertension: Secondary | ICD-10-CM

## 2010-10-27 MED ORDER — GLYBURIDE-METFORMIN 5-500 MG PO TABS: 2.0000 | ORAL_TABLET | Freq: Two times a day (BID) | ORAL | Status: DC

## 2010-10-27 MED ORDER — BENAZEPRIL-HYDROCHLOROTHIAZIDE 20-12.5 MG PO TABS: 2.0000 | ORAL_TABLET | Freq: Every day | ORAL | Status: DC

## 2010-10-27 MED ORDER — DILTIAZEM HCL ER 240 MG PO CP24: 240.0000 mg | ORAL_CAPSULE | Freq: Every day | ORAL | Status: DC

## 2010-10-28 ENCOUNTER — Other Ambulatory Visit: Payer: Self-pay | Admitting: Family Medicine

## 2010-11-06 ENCOUNTER — Other Ambulatory Visit: Payer: Self-pay

## 2010-11-06 ENCOUNTER — Telehealth: Payer: Self-pay | Admitting: Family Medicine

## 2010-11-06 MED ORDER — HYDROCODONE-ACETAMINOPHEN 10-500 MG PO TABS: 1.0000 | ORAL_TABLET | Freq: Four times a day (QID) | ORAL | Status: DC | PRN

## 2010-11-06 NOTE — Telephone Encounter (Signed)
i do not see this on med list

## 2010-11-07 NOTE — Telephone Encounter (Signed)
pls refill the hydrocodone x 3 , I spoke with the pt today, this is ok, have the pharmacy fax over the last script filled for this if you cannot find it on med list, she has severe arthritis and is on chronic pain meds, she told me today that I am the prescriber. Pls verify with the pharmacy by getting last script filled and refill x 3. I also spoke with her about the fact that her ins had notified me she has had pain meds refilled from other prescribers, she stated this was one time only in August states she always has trouble getting her refills

## 2010-11-08 NOTE — Telephone Encounter (Signed)
Verified  Patient received medicine

## 2010-11-09 ENCOUNTER — Telehealth: Payer: Self-pay | Admitting: Family Medicine

## 2010-11-10 ENCOUNTER — Other Ambulatory Visit: Payer: Self-pay | Admitting: Family Medicine

## 2010-11-10 DIAGNOSIS — M549 Dorsalgia, unspecified: Secondary | ICD-10-CM

## 2010-11-10 NOTE — Telephone Encounter (Signed)
Pt is really requesting a new pain specialistshe is interested in a local provider, pls refer to eiother dr Eduard Clos, Gerilyn Pilgrim or a new one is in [place] I think, you can call Maryruth Bun they will be able to tell you, i am sending in a referral

## 2010-11-17 NOTE — Telephone Encounter (Signed)
Pt was referred to dr. Gerilyn Pilgrim office. They will schedule her a appt and pt is aware

## 2010-11-27 ENCOUNTER — Ambulatory Visit (INDEPENDENT_AMBULATORY_CARE_PROVIDER_SITE_OTHER): Payer: Medicare Other | Admitting: Cardiology

## 2010-11-27 ENCOUNTER — Encounter: Payer: Self-pay | Admitting: Cardiology

## 2010-11-27 VITALS — BP 128/76 | HR 57 | Ht 63.0 in | Wt 173.0 lb

## 2010-11-27 DIAGNOSIS — I4892 Unspecified atrial flutter: Secondary | ICD-10-CM

## 2010-11-27 DIAGNOSIS — R609 Edema, unspecified: Secondary | ICD-10-CM

## 2010-11-27 NOTE — Progress Notes (Signed)
HPI Patient is seen today for followup history of atrial flutter.  I saw her last in May, 2012.  She had some fluid overload for a while.  Gabapentin was stopped and she was on Lasix.  Fluid resolved.  She has had no return of significant edema.  She does have a small dose of hydrochlorothiazide as part of her meds.  She does not have any significant palpitations.  She is not on Coumadin because of a significant fall with hematoma in 2009. Allergies  Allergen Reactions  . Alprazolam   . Lisinopril   . Sertraline Hcl     REACTION: diarrhea  . Sulfonamide Derivatives     Current Outpatient Prescriptions  Medication Sig Dispense Refill  . Ascorbic Acid (VITAMIN C) 500 MG tablet Take 500 mg by mouth daily.        Marland Kitchen aspirin 81 MG tablet Take 81 mg by mouth daily.        . benazepril-hydrochlorthiazide (LOTENSIN HCT) 20-12.5 MG per tablet Take 2 tablets by mouth daily.  180 tablet  1  . calcium carbonate (OS-CAL) 600 MG TABS Take 600 mg by mouth 2 (two) times daily with a meal.        . citalopram (CELEXA) 40 MG tablet TAKE 1 TABLET BY MOUTH ONCE A DAY  30 tablet  4  . diltiazem (DILACOR XR) 240 MG 24 hr capsule Take 1 capsule (240 mg total) by mouth daily.  90 capsule  1  . fenofibrate (TRICOR) 145 MG tablet Take 1 tablet (145 mg total) by mouth at bedtime.  30 tablet  4  . fish oil-omega-3 fatty acids 1000 MG capsule Take 2 g by mouth 2 (two) times daily. 2 caps bid        . fluticasone (FLONASE) 50 MCG/ACT nasal spray Place 1 spray into the nose daily. For shortness of breath      . glucosamine-chondroitin 500-400 MG tablet Take 1 tablet by mouth 2 (two) times daily.       Marland Kitchen glyBURIDE-metformin (GLUCOVANCE) 5-500 MG per tablet Take 2 tablets by mouth 2 (two) times daily with a meal.  360 tablet  1  . HYDROcodone-acetaminophen (LORTAB) 10-500 MG per tablet Take 1 tablet by mouth 4 (four) times daily as needed for pain.  120 tablet  2  . insulin glargine (LANTUS) 100 UNIT/ML injection Inject 20  Units into the skin at bedtime.  10 mL  12  . L-Methylfolate-B6-B12 (METANX) 3-35-2 MG TABS TAKE 1 TABLET BY MOUTH ONCE A DAY  30 tablet  3  . meloxicam (MOBIC) 15 MG tablet Take 1 tablet (15 mg total) by mouth daily.  30 tablet  4  . niacin (NIASPAN) 500 MG CR tablet One tab every night  30 tablet  5  . nystatin (MYCOSTATIN) powder Apply to affected area 3 times daily for 10 days, then as  needed  60 g  0  . omeprazole (PRILOSEC) 20 MG capsule Take 20 mg by mouth daily.        . rosuvastatin (CRESTOR) 40 MG tablet Take 1 tablet (40 mg total) by mouth daily.  90 tablet  1  . solifenacin (VESICARE) 10 MG tablet Take 10 mg by mouth daily.        . vitamin E 200 UNIT capsule Take 400 Units by mouth daily.         History   Social History  . Marital Status: Married    Spouse Name: N/A    Number  of Children: N/A  . Years of Education: N/A   Occupational History  . retired    Social History Main Topics  . Smoking status: Never Smoker   . Smokeless tobacco: Never Used  . Alcohol Use: No  . Drug Use: No  . Sexually Active: Not on file   Other Topics Concern  . Not on file   Social History Narrative  . No narrative on file    Family History  Problem Relation Age of Onset  . Dementia Mother   . Heart disease Father     Past Medical History  Diagnosis Date  . Atrial flutter febuary 2008       atrial flutter... DC Cardioversion...successful... holding sinus as of january 2011 coumadin therapy...discontinued...severe hematoma secondary to fall  . Hypertension   . Hyperlipidemia   . Hypercholesterolemia   . Mitral regurgitation 2010    mild ...echo...2010  . Obesity   . Sleep apnea   . Fatigue   . Depression with anxiety   . Visual loss     unspecified  . Basal cell carcinoma   . Hematochezia   . Hematoma     ...buttocks secondary to fall november 2009 with coumadin  coumadin stopped  . Diabetes mellitus     type 2  . Back pain     with radiculopathy  . Arthritis     . Osteoarthritis   . Acute cystitis   . GERD (gastroesophageal reflux disease)   . Diverticulosis of colon   . Constipation   . IBS (irritable bowel syndrome)   . Abdominal bloating   . Nausea   . Erosive esophagitis   . Aortic valve sclerosis     Ef 60 %...echo...july 2010   . Chronic granulomatous disease     right chest ... ct chest... novmber 2009  . Normal nuclear stress test     september ,2007...no ischemia  . Anticoagulant long-term use     Coumadin  stopped...severe hematoma  from fall  . Edema     Painful, April, 2012  . Ejection fraction     EF 60-65%, echo, April 13, 2010, normal RV function, mild mitral regurgitation    Past Surgical History  Procedure Date  . Bilateral bunion removel [id number]  . Bilaterial knee replacement     2000,2001  . Tonsillectomy 1957  . Appendectomy 1948  . Dilation and curettage of uterus 1952 and 2001  . Abdominal hysterectomy 2001  . Cataract extraction 2001    right   . Skin cancer excision   . Cataract extraction     left 2005  . Basal cell carcinoma excision 09/2009    right forarm and back of neck and left side     ROS     Patient denies fever, chills, headache, sweats, rash, change in vision, change in hearing chest pain, cough, nausea vomiting, urinary symptoms.  She does have significant difficulty with low back and leg pain.  This appears to be related to spinal disease.  PHYSICAL EXAM  Patient is oriented to person time and place.  Affect is normal.  She is uncomfortable with her back and leg pain.  There is no jugulovenous distention.  Lungs are clear.  Respiratory effort is not labored.  Cardiac exam reveals S1 and S2.  No clicks or significant murmurs.  The abdomen is soft.  There is no peripheral edema.  Filed Vitals:   11/27/10 1012  BP: 128/76  Pulse: 57  Height: 5\' 3"  (1.6  m)  Weight: 173 lb (78.472 kg)    EKG   EKG is done and reviewed by me.  She is holding sinus rhythm.  There is no significant  change in the EKG  ASSESSMENT & PLAN

## 2010-11-27 NOTE — Patient Instructions (Signed)
Continue all current medications. Your physician wants you to follow up in:  1 year.  You will receive a reminder letter in the mail one-two months in advance.  If you don't receive a letter, please call our office to schedule the follow up appointment   

## 2010-11-27 NOTE — Assessment & Plan Note (Signed)
Edema has resolved.  No further workup.  I will plan to see her from the cardiology viewpoint in one year.

## 2010-11-27 NOTE — Assessment & Plan Note (Signed)
Her rhythm is stable.  She is in sinus rhythm.  No further workup.

## 2010-11-28 ENCOUNTER — Telehealth: Payer: Self-pay | Admitting: Family Medicine

## 2010-11-28 NOTE — Telephone Encounter (Signed)
Yes this is fine, pls refer her to the one in [place], pls let her know

## 2010-11-29 NOTE — Telephone Encounter (Signed)
[facility] was [facility]. They will call pt with appt and time. Pt is aware

## 2010-12-05 ENCOUNTER — Telehealth: Payer: Self-pay | Admitting: Family Medicine

## 2010-12-06 NOTE — Telephone Encounter (Signed)
This was sent in with additional refills. She should not need anymore at this time

## 2010-12-14 ENCOUNTER — Other Ambulatory Visit: Payer: Self-pay | Admitting: Family Medicine

## 2010-12-14 DIAGNOSIS — Z139 Encounter for screening, unspecified: Secondary | ICD-10-CM

## 2011-01-01 ENCOUNTER — Ambulatory Visit (HOSPITAL_COMMUNITY): Payer: Medicare Other

## 2011-01-09 ENCOUNTER — Ambulatory Visit (HOSPITAL_COMMUNITY)
Admission: RE | Admit: 2011-01-09 | Discharge: 2011-01-09 | Disposition: A | Discharge status: Home / Self Care | Payer: Medicare Other | Source: Ambulatory Visit | Attending: Family Medicine | Admitting: Family Medicine

## 2011-01-09 DIAGNOSIS — Z1231 Encounter for screening mammogram for malignant neoplasm of breast: Secondary | ICD-10-CM | POA: Insufficient documentation

## 2011-01-09 DIAGNOSIS — Z139 Encounter for screening, unspecified: Secondary | ICD-10-CM

## 2011-01-29 ENCOUNTER — Other Ambulatory Visit: Payer: Self-pay | Admitting: Family Medicine

## 2011-01-30 LAB — LIPID PANEL
Cholesterol: 190 mg/dL (ref 0–200)
Triglycerides: 127 mg/dL (ref <150)
VLDL: 25 mg/dL (ref 0–40)

## 2011-01-30 LAB — COMPLETE METABOLIC PANEL WITH GFR
AST: 21 U/L (ref 0–37)
BUN: 26 mg/dL — ABNORMAL HIGH (ref 6–23)
Calcium: 10.4 mg/dL (ref 8.4–10.5)
Chloride: 103 mEq/L (ref 96–112)
Creat: 0.79 mg/dL (ref 0.50–1.10)
GFR, Est African American: 82 mL/min (ref >60)
GFR, Est Non African American: 71 mL/min (ref >60)
Potassium: 4.5 mEq/L (ref 3.5–5.3)
Sodium: 142 mEq/L (ref 135–145)

## 2011-01-31 ENCOUNTER — Encounter: Payer: Self-pay | Admitting: Family Medicine

## 2011-01-31 ENCOUNTER — Ambulatory Visit (INDEPENDENT_AMBULATORY_CARE_PROVIDER_SITE_OTHER): Payer: Medicare Other | Admitting: Family Medicine

## 2011-01-31 VITALS — BP 140/64 | HR 74 | Resp 16 | Ht 62.0 in | Wt 180.8 lb

## 2011-01-31 DIAGNOSIS — M1990 Unspecified osteoarthritis, unspecified site: Secondary | ICD-10-CM

## 2011-01-31 DIAGNOSIS — Z13820 Encounter for screening for osteoporosis: Secondary | ICD-10-CM

## 2011-01-31 DIAGNOSIS — E119 Type 2 diabetes mellitus without complications: Secondary | ICD-10-CM

## 2011-01-31 DIAGNOSIS — E785 Hyperlipidemia, unspecified: Secondary | ICD-10-CM

## 2011-01-31 DIAGNOSIS — I10 Essential (primary) hypertension: Secondary | ICD-10-CM

## 2011-01-31 DIAGNOSIS — F329 Major depressive disorder, single episode, unspecified: Secondary | ICD-10-CM

## 2011-01-31 MED ORDER — INSULIN GLARGINE 100 UNIT/ML ~~LOC~~ SOLN: 25.0000 [IU] | Freq: Every day | SUBCUTANEOUS | Status: DC

## 2011-01-31 NOTE — Patient Instructions (Addendum)
F/u in 4 month  Your labs are better, congrats!!!   Increase lantus to 25 units at bedtime.   hBa1C non fasting in 4 month  You are referred for bone density scan.  Be careful not to fall, and .. let's go to [employer]!!!

## 2011-01-31 NOTE — Assessment & Plan Note (Signed)
Controlled, no change in medication  

## 2011-01-31 NOTE — Assessment & Plan Note (Signed)
Improved, dose inc on lantus

## 2011-02-04 NOTE — Assessment & Plan Note (Signed)
Increased personal stress, but controlled on med

## 2011-02-04 NOTE — Assessment & Plan Note (Signed)
Improved and controled, no med change 

## 2011-02-04 NOTE — Progress Notes (Signed)
  Subjective:    Patient ID: [name] [name], female    DOB: [birth year], 78 y.o.   MRN: [id number]  HPI The PT is here for follow up and re-evaluation of chronic medical conditions, medication management and review of any available recent lab and radiology data.  Preventive health is updated, specifically  Cancer screening and Immunization.   Questions or concerns regarding consultations or procedures which the PT has had in the interim are  addressed. The PT denies any adverse reactions to current medications since the last visit.  There are no new concerns.  Chronic back pain  Improved with recent epiduarals Reports blood sugars are noted to be improved, though fasting blood sugars still average around 140 to 150. Denies polyuria, polydipsia, blurred vision or hypoglycemic episodes. Somewhat stressed since her grand daughter in [place], recently overdosed on cymbalta, handling this the best she is able to   Review of Systems See HPI Denies recent fever or chills. Denies sinus pressure, nasal congestion, ear pain or sore throat. Denies chest congestion, productive cough or wheezing. Denies chest pains, palpitations and leg swelling Denies abdominal pain, nausea, vomiting,diarrhea or constipation.   Denies dysuria, frequency, hesitancy or incontinence. Denies uncontrolled  joint pain, swelling and limitation in mobility. Denies headaches, seizures, numbness, or tingling.  uncontrolled depression, anxiety or insomnia. Denies skin break down or rash.        Objective:   Physical Exam Patient alert and oriented and in no cardiopulmonary distress.  HEENT: No facial asymmetry, EOMI, no sinus tenderness,  oropharynx pink and moist.  Neck supple no adenopathy.  Chest: Clear to auscultation bilaterally.  CVS: S1, S2 no murmurs, no S3.  ABD: Soft non tender. Bowel sounds normal.  Ext: No edema  MS: decreased ROM spine, shoulders, hips and knees.  Skin: Intact, no ulcerations or rash  noted.  Psych: Good eye contact, normal affect. Memory intact not anxious or depressed appearing.  CNS: CN 2-12 intact, power, tone and sensation normal throughout. Diabetic Foot Check:  Appearance -  Calluses high arch with some deformity Skin - no unusual pallor or redness Sensation - grossly intact to light touch Monofilament testing -  Right - Great toe, medial, central, lateral ball and posterior foot diminished Left - Great toe, medial, central, lateral ball and posterior foot diminished Pulses Left - Dorsalis Pedis and Posterior Tibia normal Right - Dorsalis Pedis and Posterior Tibia normal        Assessment & Plan:

## 2011-02-04 NOTE — Assessment & Plan Note (Signed)
reduced pain with recent epidurals

## 2011-02-05 ENCOUNTER — Telehealth: Payer: Self-pay | Admitting: Family Medicine

## 2011-02-06 ENCOUNTER — Other Ambulatory Visit: Payer: Self-pay | Admitting: Family Medicine

## 2011-02-13 ENCOUNTER — Other Ambulatory Visit (HOSPITAL_COMMUNITY): Payer: Medicare Other

## 2011-02-16 ENCOUNTER — Telehealth: Payer: Self-pay | Admitting: Family Medicine

## 2011-02-23 NOTE — Telephone Encounter (Signed)
Pt aware.

## 2011-02-28 ENCOUNTER — Other Ambulatory Visit: Payer: Self-pay | Admitting: Family Medicine

## 2011-03-14 ENCOUNTER — Telehealth: Payer: Self-pay | Admitting: Family Medicine

## 2011-03-14 ENCOUNTER — Other Ambulatory Visit: Payer: Self-pay | Admitting: Family Medicine

## 2011-03-14 MED ORDER — SOLIFENACIN SUCCINATE 10 MG PO TABS: 10.0000 mg | ORAL_TABLET | Freq: Every day | ORAL | Status: DC

## 2011-03-14 NOTE — Telephone Encounter (Signed)
Med sent in, pls let her know

## 2011-03-14 NOTE — Telephone Encounter (Signed)
Ok to refill? Historically on medlist

## 2011-03-31 ENCOUNTER — Other Ambulatory Visit: Payer: Self-pay | Admitting: Family Medicine

## 2011-04-04 ENCOUNTER — Other Ambulatory Visit: Payer: Self-pay

## 2011-04-04 MED ORDER — HYDROCODONE-ACETAMINOPHEN 10-500 MG PO TABS: ORAL_TABLET | ORAL | Status: DC

## 2011-04-12 ENCOUNTER — Telehealth: Payer: Self-pay | Admitting: Family Medicine

## 2011-04-12 ENCOUNTER — Other Ambulatory Visit: Payer: Self-pay | Admitting: Family Medicine

## 2011-04-12 DIAGNOSIS — M549 Dorsalgia, unspecified: Secondary | ICD-10-CM

## 2011-04-12 NOTE — Telephone Encounter (Signed)
pls refer her to Dr Georgiann Hahn re chronic back pain I will enter

## 2011-04-12 NOTE — Telephone Encounter (Signed)
Was seeing Dr at [facility] and now she is on the "outs" with them. Is now having a lot of pain in her back and has fallen a couple times. Needs to see someone else about it. [place] preferably but willing to go to [place] is necessary

## 2011-04-18 ENCOUNTER — Other Ambulatory Visit: Payer: Self-pay | Admitting: Physical Medicine and Rehabilitation

## 2011-04-18 DIAGNOSIS — M719 Bursopathy, unspecified: Secondary | ICD-10-CM

## 2011-04-18 DIAGNOSIS — M67919 Unspecified disorder of synovium and tendon, unspecified shoulder: Secondary | ICD-10-CM

## 2011-04-21 ENCOUNTER — Ambulatory Visit
Admission: RE | Admit: 2011-04-21 | Discharge: 2011-04-21 | Disposition: A | Discharge status: Home / Self Care | Payer: Medicare Other | Source: Ambulatory Visit | Attending: Physical Medicine and Rehabilitation | Admitting: Physical Medicine and Rehabilitation

## 2011-04-21 DIAGNOSIS — M719 Bursopathy, unspecified: Secondary | ICD-10-CM

## 2011-04-21 DIAGNOSIS — M67919 Unspecified disorder of synovium and tendon, unspecified shoulder: Secondary | ICD-10-CM

## 2011-04-23 ENCOUNTER — Telehealth: Payer: Self-pay | Admitting: Family Medicine

## 2011-04-26 ENCOUNTER — Telehealth: Payer: Self-pay | Admitting: Family Medicine

## 2011-04-26 NOTE — Telephone Encounter (Signed)
They are going to fax the form for [place] to fill out

## 2011-04-27 ENCOUNTER — Telehealth: Payer: Self-pay | Admitting: Family Medicine

## 2011-04-27 NOTE — Telephone Encounter (Signed)
I spoke with her yesterday and Dr Lodema Hong has it to complete. Will be faxed today

## 2011-04-27 NOTE — Telephone Encounter (Signed)
Pt aware the form was sent

## 2011-05-02 ENCOUNTER — Encounter: Payer: Self-pay | Admitting: Orthopedic Surgery

## 2011-05-02 ENCOUNTER — Ambulatory Visit (INDEPENDENT_AMBULATORY_CARE_PROVIDER_SITE_OTHER): Payer: Medicare Other | Admitting: Orthopedic Surgery

## 2011-05-02 ENCOUNTER — Ambulatory Visit (INDEPENDENT_AMBULATORY_CARE_PROVIDER_SITE_OTHER): Payer: Medicare Other

## 2011-05-02 VITALS — BP 100/50 | Ht 62.0 in | Wt 168.0 lb

## 2011-05-02 DIAGNOSIS — M19019 Primary osteoarthritis, unspecified shoulder: Secondary | ICD-10-CM | POA: Insufficient documentation

## 2011-05-02 DIAGNOSIS — M75102 Unspecified rotator cuff tear or rupture of left shoulder, not specified as traumatic: Secondary | ICD-10-CM | POA: Insufficient documentation

## 2011-05-02 DIAGNOSIS — M25519 Pain in unspecified shoulder: Secondary | ICD-10-CM

## 2011-05-02 DIAGNOSIS — M67919 Unspecified disorder of synovium and tendon, unspecified shoulder: Secondary | ICD-10-CM

## 2011-05-02 DIAGNOSIS — M719 Bursopathy, unspecified: Secondary | ICD-10-CM

## 2011-05-02 MED ORDER — MORPHINE SULFATE ER 15 MG PO TBCR: 15.0000 mg | EXTENDED_RELEASE_TABLET | Freq: Two times a day (BID) | ORAL | Status: DC

## 2011-05-02 NOTE — Progress Notes (Signed)
  Subjective:    [name] [name] is a 78 y.o. female who presents with left shoulder pain off and on x several years . She ended her shoulder several years ago, but was able to continue to function. She then fell. Her husband tried to pick her up she had more pain and bruising along the anterior portion of her arm and now she can move her arm in forward elevation or abduction.  She complains of sharp throbbing, stabbing pain Pain is 8 or 9/10. It is unrelieved by 10 mg of hydrocodone  Timing is intermittent. Associated symptoms or tingling, infection Pain is worse with moving the arm or attempting to elevate the arm and she can't her hair.  Review of systems history of snoring, constipation, frequency, urgency, rash itching, unsteady gait, easy bruising excessive urination, seasonal allergies, nervousness, anxiety, and depression The following portions of the patient's history were reviewed and updated as appropriate: allergies, current medications, past family history, past medical history, past social history, past surgical history and problem list.  Review of Systems A comprehensive review of systems was negative.   Objective:    BP 100/50  Ht 5\' 2"  (1.575 m)  Wt 168 lb (76.204 kg)  BMI 30.73 kg/m2 The following portions of the patient's history were reviewed and updated as appropriate: allergies, current medications, past family history, past medical history, past social history, past surgical history and problem list.   Review of Systems   Objective:    BP 122/72  Ht 4\' 11"  (1.499 m)  Wt 61.236 kg (135 lb)  BMI 27.27 kg/m2        Vital signs are stable as recorded  General appearance is normal  The patient is alert and oriented x3  The patient's mood and affect are normal  Gait assessment: abnormal  The cardiovascular exam reveals normal pulses and temperature without edema swelling.  The lymphatic system is negative for palpable lymph nodes  The sensory exam is  normal.  There are no pathologic reflexes.  Balance is normal.   Exam of the LEFT shoulder reveals that she has crepitus and tenderness on the shoulder pain with range of motion. Range of motion is limited. She has limited forward elevation, she has limited abduction. She has minimal external rotation. She has a positive sign for subscap rupture. She is weak. Supraspinatus and infraspinatus. Inferior subluxation test was normal. I could not abduct and externally rotate to test for apprehension or instability. In the anterior plane. Skin was intact.  X-ray plain film, LEFT shoulder, show significant degenerative arthritis of the glenohumeral joint, as well as the a.c. joint. She had an MRI, which shows subscap tear in supraspinatus, infraspinatus tear, and significant joint pathology: no fracture, dislocation, swelling or degenerative changes noted and     Assessment:   LEFT shoulder arthritis with cuff tear, not a candidate for normal prosthesis for hemiarthroplasty   Plan:    recommend referral to [facility] for possible reverse prosthesis.  Patient is agreeable.  Try MS Contin 15 mg twice a day for pain relief

## 2011-05-02 NOTE — Patient Instructions (Signed)
Try new pain medication   Referral to [facility] for shoulder reverse prosthesis

## 2011-05-08 ENCOUNTER — Encounter: Payer: Self-pay | Admitting: *Deleted

## 2011-05-08 NOTE — Progress Notes (Signed)
Patient ID: [name] [name], female   DOB: 11-01-1929, 78 y.o.   MRN: [id number] Referral sent to [facility] today

## 2011-05-29 ENCOUNTER — Ambulatory Visit (INDEPENDENT_AMBULATORY_CARE_PROVIDER_SITE_OTHER): Payer: Medicare Other | Admitting: Family Medicine

## 2011-05-29 ENCOUNTER — Encounter: Payer: Self-pay | Admitting: Family Medicine

## 2011-05-29 VITALS — BP 122/70 | HR 74 | Resp 18 | Ht 62.0 in | Wt 174.0 lb

## 2011-05-29 DIAGNOSIS — K219 Gastro-esophageal reflux disease without esophagitis: Secondary | ICD-10-CM

## 2011-05-29 DIAGNOSIS — E785 Hyperlipidemia, unspecified: Secondary | ICD-10-CM

## 2011-05-29 DIAGNOSIS — R32 Unspecified urinary incontinence: Secondary | ICD-10-CM

## 2011-05-29 DIAGNOSIS — E875 Hyperkalemia: Secondary | ICD-10-CM

## 2011-05-29 DIAGNOSIS — I10 Essential (primary) hypertension: Secondary | ICD-10-CM

## 2011-05-29 DIAGNOSIS — IMO0002 Reserved for concepts with insufficient information to code with codable children: Secondary | ICD-10-CM

## 2011-05-29 DIAGNOSIS — E860 Dehydration: Secondary | ICD-10-CM

## 2011-05-29 DIAGNOSIS — E119 Type 2 diabetes mellitus without complications: Secondary | ICD-10-CM

## 2011-05-29 DIAGNOSIS — F329 Major depressive disorder, single episode, unspecified: Secondary | ICD-10-CM

## 2011-05-29 MED ORDER — HYDROCODONE-ACETAMINOPHEN 10-500 MG PO TABS: ORAL_TABLET | ORAL | Status: DC

## 2011-05-29 NOTE — Progress Notes (Signed)
  Subjective:    Patient ID: [name] [name], female    DOB: June 24, 1929, 78 y.o.   MRN: [id number]  HPI The PT is here for follow up and re-evaluation of chronic medical conditions, medication management and review of any available recent lab and radiology data.  Preventive health is updated, specifically  Cancer screening and Immunization.   Questions or concerns regarding consultations or procedures which the PT has had in the interim are  addressed. The PT denies any adverse reactions to current medications since the last visit.  6 day h/o severe right groin pain radiating to right lateral thigh, took a hard seat on the commode which seems to have been the trigger.Walking is worse absolutely needs the use of a cane.Spouse present for the first time ever, states he is frustrated, wife's pain gets progressively worse she has been to multiple centers, and "no one seems to be doing anything about it" Pain management referred her to ortho re left shoulder pain, who unfortunately started pt on additional pain med, direct contact made with the Doc involved Pt denies polyuria or polydipsia, fasting sugars are seldom over 120      Review of Systems See HPI Denies recent fever or chills. Denies sinus pressure, nasal congestion, ear pain or sore throat. Denies chest congestion, productive cough or wheezing. Denies chest pains, palpitations and leg swelling Denies abdominal pain, nausea, vomiting,diarrhea or constipation.   Denies dysuria, frequency, hesitancy or incontinence.  Denies headaches, seizures, numbness, or tingling. Denies depression, anxiety or insomnia. Denies skin break down or rash.       Objective:   Physical Exam Patient alert and oriented and in no cardiopulmonary distress.Pt in oain  HEENT: No facial asymmetry, EOMI, no sinus tenderness,  oropharynx pink and moist.  Neck decreased ROM no adenopathy.  Chest: Clear to auscultation bilaterally.  CVS: S1, S2 no murmurs, no  S3.  ABD: Soft non tender. Bowel sounds normal.  Ext: No edema  MS: Decreased ROM spine, shoulders, hips and knees  Skin: Intact, no ulcerations or rash noted.  Psych: Good eye contact, normal affect. Memory intact not anxious or depressed appearing.  CNS: CN 2-12 intact, power, tone and sensation normal throughout.        Assessment & Plan:

## 2011-05-29 NOTE — Patient Instructions (Addendum)
F/u in 2 month  You are referred for an MRI of your low back since you report increased and uncontrolled back pain, with decreased ability to get around, I will then have neurosurgery evaluate you  I will get a message to Dr Eduard Clos for further f/u of this problem of uncontrolled back pain, hydrocodone will be prescribed for one month only at this time, since your pain needs are severe, and you are currently getting scripts from 2 offices   HbA1C and chem 7 today    Please stop the multiple different medications for vitamins take one centrum daily, new updated list is correct.  Stop mobic and gabapentin

## 2011-05-30 ENCOUNTER — Ambulatory Visit: Payer: Medicare Other | Admitting: Family Medicine

## 2011-05-30 LAB — BASIC METABOLIC PANEL
CO2: 26 mEq/L (ref 19–32)
Calcium: 9.9 mg/dL (ref 8.4–10.5)
Chloride: 106 mEq/L (ref 96–112)
Creat: 1.25 mg/dL — ABNORMAL HIGH (ref 0.50–1.10)
Glucose, Bld: 134 mg/dL — ABNORMAL HIGH (ref 70–99)

## 2011-05-30 LAB — HEMOGLOBIN A1C: Hgb A1c MFr Bld: 7.2 % — ABNORMAL HIGH (ref <5.7)

## 2011-05-31 ENCOUNTER — Telehealth: Payer: Self-pay | Admitting: Family Medicine

## 2011-05-31 ENCOUNTER — Other Ambulatory Visit: Payer: Self-pay | Admitting: Family Medicine

## 2011-05-31 DIAGNOSIS — M25561 Pain in right knee: Secondary | ICD-10-CM

## 2011-05-31 DIAGNOSIS — E860 Dehydration: Secondary | ICD-10-CM

## 2011-05-31 MED ORDER — DIAZEPAM 2 MG PO TABS: ORAL_TABLET | ORAL | Status: DC

## 2011-05-31 NOTE — Telephone Encounter (Signed)
Spoke with pt , valium printed, please fax.  She also c/o bruise, pain and swelling of right knee after a fall 1 week ago, x ray of the knee i s ordered.  I advised her to call Dr Ozzie Hoyle office for appt, as I had spoken with her and she intends too manage her chronic back pain

## 2011-05-31 NOTE — Telephone Encounter (Signed)
Ordered and sent to lab per pt request

## 2011-06-01 NOTE — Telephone Encounter (Signed)
rx faxed

## 2011-06-02 LAB — BASIC METABOLIC PANEL
BUN: 32 mg/dL — ABNORMAL HIGH (ref 6–23)
CO2: 26 mEq/L (ref 19–32)
Calcium: 9.8 mg/dL (ref 8.4–10.5)
Chloride: 105 mEq/L (ref 96–112)
Creat: 0.95 mg/dL (ref 0.50–1.10)

## 2011-06-04 ENCOUNTER — Ambulatory Visit (HOSPITAL_COMMUNITY)
Admission: RE | Admit: 2011-06-04 | Discharge: 2011-06-04 | Disposition: A | Discharge status: Home / Self Care | Payer: Medicare Other | Source: Ambulatory Visit | Attending: Family Medicine | Admitting: Family Medicine

## 2011-06-04 DIAGNOSIS — M25561 Pain in right knee: Secondary | ICD-10-CM

## 2011-06-04 DIAGNOSIS — M5137 Other intervertebral disc degeneration, lumbosacral region: Secondary | ICD-10-CM | POA: Insufficient documentation

## 2011-06-04 DIAGNOSIS — IMO0002 Reserved for concepts with insufficient information to code with codable children: Secondary | ICD-10-CM

## 2011-06-04 DIAGNOSIS — M51379 Other intervertebral disc degeneration, lumbosacral region without mention of lumbar back pain or lower extremity pain: Secondary | ICD-10-CM | POA: Insufficient documentation

## 2011-06-04 DIAGNOSIS — M545 Low back pain, unspecified: Secondary | ICD-10-CM | POA: Insufficient documentation

## 2011-06-04 NOTE — Assessment & Plan Note (Signed)
May be increased, difficult to determine, spouse present for the first time, unusual dynamics between the 2, in the past pt has voiced poor relationships

## 2011-06-04 NOTE — Assessment & Plan Note (Signed)
Controlled, no change in medication  

## 2011-06-04 NOTE — Assessment & Plan Note (Signed)
Improved, no change in management 

## 2011-06-04 NOTE — Assessment & Plan Note (Signed)
Improved with medication, continue same 

## 2011-06-04 NOTE — Assessment & Plan Note (Signed)
Worsened and uncontrolled pain, rept MRI, also re contact pain mD re management of same Also alert ortho pt has pain contract here

## 2011-06-04 NOTE — Assessment & Plan Note (Signed)
Hyperlipidemia:Low fat diet discussed and encouraged.  Controlled, no change in medication   

## 2011-06-05 ENCOUNTER — Other Ambulatory Visit: Payer: Self-pay | Admitting: Family Medicine

## 2011-06-11 ENCOUNTER — Other Ambulatory Visit: Payer: Self-pay | Admitting: Family Medicine

## 2011-06-13 ENCOUNTER — Other Ambulatory Visit: Payer: Self-pay

## 2011-06-13 MED ORDER — MELOXICAM 15 MG PO TABS: 15.0000 mg | ORAL_TABLET | Freq: Every day | ORAL | Status: DC

## 2011-06-20 ENCOUNTER — Telehealth: Payer: Self-pay | Admitting: Orthopedic Surgery

## 2011-06-20 NOTE — Telephone Encounter (Signed)
[name] [name] called to ask the status on her referral to [facility]. Her # [phone]

## 2011-06-21 NOTE — Telephone Encounter (Signed)
Made call to [facility] regarding referral Was advised not taking new referrals until early July They are expecting new doctors there at that time Was advised to resend the referral and patient would be seen some time in July Patient aware

## 2011-06-27 ENCOUNTER — Telehealth: Payer: Self-pay | Admitting: Family Medicine

## 2011-06-27 DIAGNOSIS — I10 Essential (primary) hypertension: Secondary | ICD-10-CM

## 2011-06-27 MED ORDER — BENAZEPRIL-HYDROCHLOROTHIAZIDE 20-12.5 MG PO TABS: 2.0000 | ORAL_TABLET | Freq: Every day | ORAL | Status: DC

## 2011-06-27 MED ORDER — DILTIAZEM HCL ER 240 MG PO CP24: 240.0000 mg | ORAL_CAPSULE | Freq: Every day | ORAL | Status: DC

## 2011-06-27 MED ORDER — GLYBURIDE-METFORMIN 5-500 MG PO TABS: 2.0000 | ORAL_TABLET | Freq: Two times a day (BID) | ORAL | Status: DC

## 2011-06-27 NOTE — Telephone Encounter (Signed)
Called meds and 3 requested sent in to [facility]

## 2011-07-10 ENCOUNTER — Other Ambulatory Visit: Payer: Self-pay | Admitting: Family Medicine

## 2011-07-16 ENCOUNTER — Other Ambulatory Visit: Payer: Self-pay

## 2011-07-16 MED ORDER — CITALOPRAM HYDROBROMIDE 40 MG PO TABS: 40.0000 mg | ORAL_TABLET | Freq: Every day | ORAL | Status: DC

## 2011-08-01 ENCOUNTER — Ambulatory Visit (INDEPENDENT_AMBULATORY_CARE_PROVIDER_SITE_OTHER): Payer: Medicare Other | Admitting: Family Medicine

## 2011-08-01 ENCOUNTER — Encounter: Payer: Self-pay | Admitting: Family Medicine

## 2011-08-01 VITALS — BP 148/60 | HR 75 | Resp 18 | Ht 62.0 in | Wt 167.0 lb

## 2011-08-01 DIAGNOSIS — F411 Generalized anxiety disorder: Secondary | ICD-10-CM

## 2011-08-01 DIAGNOSIS — F329 Major depressive disorder, single episode, unspecified: Secondary | ICD-10-CM

## 2011-08-01 DIAGNOSIS — E785 Hyperlipidemia, unspecified: Secondary | ICD-10-CM

## 2011-08-01 DIAGNOSIS — E119 Type 2 diabetes mellitus without complications: Secondary | ICD-10-CM

## 2011-08-01 DIAGNOSIS — I10 Essential (primary) hypertension: Secondary | ICD-10-CM

## 2011-08-01 NOTE — Patient Instructions (Addendum)
Annual wellness end September.  Please bring medication to next visit  Fasting lipid, cmp and EGFR, HBa1C 3 to 5 days before next visit  I am sorry to hear of all the problems you are having that worry you, I hope things get better.You are referred to therapist  Reduce lantus to 20 units since your sugars are running a bit low since you eat less

## 2011-08-05 NOTE — Assessment & Plan Note (Signed)
Uncontrolled and increased, espescially worried about her grand daughter who recently attempted suicide

## 2011-08-05 NOTE — Assessment & Plan Note (Signed)
Controlled,will attempt to simplify treatment

## 2011-08-05 NOTE — Progress Notes (Signed)
  Subjective:    Patient ID: [name] [name], female    DOB: Mar 06, 1929, 78 y.o.   MRN: [id number]  HPI The PT is here for follow up and re-evaluation of chronic medical conditions, medication management and review of any available recent lab and radiology data.  Preventive health is updated, specifically  Cancer screening and Immunization.   Questions or concerns regarding consultations or procedures which the PT has had in the interim are  addressed. The PT denies any adverse reactions to current medications since the last visit.  C/o recurrent low sugars episodes down to the 50's and 60's , states her appetite is reduced and she has to force herself to eat. C/o increased depression and anxiety, is interested in therapy, not suicidal or homicidal    Review of Systems See HPI Denies recent fever or chills. Denies sinus pressure, nasal congestion, ear pain or sore throat. Denies chest congestion, productive cough or wheezing. Denies chest pains, palpitations and leg swelling Denies abdominal pain, nausea, vomiting,diarrhea or constipation.   Denies dysuria, frequency, hesitancy or incontinence. Chronic back pain  and limitation in mobility.Now managed by pain clinic Denies headaches, seizures, numbness, or tingling.  Denies skin break down or rash.        Objective:   Physical Exam  Patient alert and oriented and in no cardiopulmonary distress.  HEENT: No facial asymmetry, EOMI, no sinus tenderness,  oropharynx pink and moist.  Neck supple no adenopathy.  Chest: Clear to auscultation bilaterally.  CVS: S1, S2 no murmurs, no S3.  ABD: Soft non tender. Bowel sounds normal.  Ext: No edema  JX:[id number] e ROM spine, shoulders, hips and knees.  Skin: Intact, no ulcerations or rash noted.  Psych: Good eye contact, flat  affect. Memory intact  anxious and  depressed appearing.  CNS: CN 2-12 intact, power,  normal throughout.  Diabetic Foot Check:  Appearance - no lesions,  ulcers or calluses, foot deformed with cocked toes a Skin - no unusual pallor or redness Sensation - grossly intact to light touch Monofilament testing -  Right - Great toe, medial, central, lateral ball and posterior foot diminished Left - Great toe, medial, central, lateral ball and posterior foot diminished Pulses Left - Dorsalis Pedis and Posterior Tibia normal Right - Dorsalis Pedis and Posterior Tibia normal      Assessment & Plan:

## 2011-08-05 NOTE — Assessment & Plan Note (Addendum)
Increased and uncontrolled, not suicidal or homicidal, no med change, refer to therapy

## 2011-08-05 NOTE — Assessment & Plan Note (Signed)
Controlled, no change in medication DASH diet and commitment to daily physical activity for a minimum of 30 minutes discussed and encouraged, as a part of hypertension management. The importance of attaining a healthy weight is also discussed.  

## 2011-08-05 NOTE — Assessment & Plan Note (Signed)
Recent hypoglycemia. likely due to reduced oral intake , reduce dose of medication

## 2011-08-08 ENCOUNTER — Other Ambulatory Visit: Payer: Self-pay | Admitting: Family Medicine

## 2011-08-09 ENCOUNTER — Telehealth: Payer: Self-pay | Admitting: Family Medicine

## 2011-08-09 ENCOUNTER — Ambulatory Visit (INDEPENDENT_AMBULATORY_CARE_PROVIDER_SITE_OTHER): Payer: Medicare Other

## 2011-08-09 DIAGNOSIS — E119 Type 2 diabetes mellitus without complications: Secondary | ICD-10-CM

## 2011-08-09 MED ORDER — INSULIN ASPART 100 UNIT/ML ~~LOC~~ SOLN
9.0000 [IU] | Freq: Once | SUBCUTANEOUS | Status: AC
  Administered 2011-08-10: 9 [IU] via SUBCUTANEOUS

## 2011-08-09 NOTE — Telephone Encounter (Signed)
Insulin given per Dr Lodema Hong (9 units) and pt aware of message

## 2011-08-09 NOTE — Telephone Encounter (Signed)
Check her blood sugar when she comes I will tell you how much short acting to give

## 2011-08-09 NOTE — Telephone Encounter (Signed)
Advise her to take 30 units of lantus tonight also, normally she is on 25 units

## 2011-08-10 DIAGNOSIS — E119 Type 2 diabetes mellitus without complications: Secondary | ICD-10-CM

## 2011-08-10 NOTE — Telephone Encounter (Signed)
Nurse has took care of this

## 2011-08-10 NOTE — Progress Notes (Signed)
Insulin injection given with no complications

## 2011-09-24 LAB — COMPLETE METABOLIC PANEL WITH GFR
ALT: 16 U/L (ref 0–35)
AST: 24 U/L (ref 0–37)
Albumin: 4.6 g/dL (ref 3.5–5.2)
Alkaline Phosphatase: 41 U/L (ref 39–117)
GFR, Est Non African American: 53 mL/min — ABNORMAL LOW
Glucose, Bld: 73 mg/dL (ref 70–99)
Potassium: 4.3 mEq/L (ref 3.5–5.3)
Sodium: 141 mEq/L (ref 135–145)
Total Protein: 7.5 g/dL (ref 6.0–8.3)

## 2011-09-24 LAB — LIPID PANEL
LDL Cholesterol: 55 mg/dL (ref 0–99)
Total CHOL/HDL Ratio: 2 Ratio

## 2011-09-27 ENCOUNTER — Ambulatory Visit (INDEPENDENT_AMBULATORY_CARE_PROVIDER_SITE_OTHER): Payer: Medicare Other | Admitting: Family Medicine

## 2011-09-27 ENCOUNTER — Encounter: Payer: Self-pay | Admitting: Family Medicine

## 2011-09-27 VITALS — BP 130/60 | HR 68 | Resp 15 | Ht 62.0 in | Wt 169.0 lb

## 2011-09-27 DIAGNOSIS — E785 Hyperlipidemia, unspecified: Secondary | ICD-10-CM

## 2011-09-27 DIAGNOSIS — R5381 Other malaise: Secondary | ICD-10-CM

## 2011-09-27 DIAGNOSIS — E119 Type 2 diabetes mellitus without complications: Secondary | ICD-10-CM

## 2011-09-27 DIAGNOSIS — Z23 Encounter for immunization: Secondary | ICD-10-CM

## 2011-09-27 DIAGNOSIS — Z0000 Encounter for general adult medical examination without abnormal findings: Secondary | ICD-10-CM

## 2011-09-27 DIAGNOSIS — I10 Essential (primary) hypertension: Secondary | ICD-10-CM

## 2011-09-27 MED ORDER — SOLIFENACIN SUCCINATE 10 MG PO TABS: 10.0000 mg | ORAL_TABLET | Freq: Every day | ORAL | Status: DC

## 2011-09-27 MED ORDER — GLYBURIDE-METFORMIN 5-500 MG PO TABS: ORAL_TABLET | ORAL | Status: DC

## 2011-09-27 MED ORDER — CITALOPRAM HYDROBROMIDE 40 MG PO TABS: 40.0000 mg | ORAL_TABLET | Freq: Every day | ORAL | Status: DC

## 2011-09-27 NOTE — Progress Notes (Signed)
Subjective:    Patient ID: [name] [name], female    DOB: 05-29-1929, 78 y.o.   MRN: [id number]  HPI General medical conditions and labs reviewed, there will be a dose reduction in diabetic meds, fasting sugars are in the 50's at times, and hBA1C is 6.7 , lipid lowering medications will be cut by 1 tablet due to excellent labs Preventive Screening-Counseling & Management   Patient present here today for a Medicare annual wellness visit.   Current Problems (verified)   Medications Prior to Visit Allergies (verified)   PAST HISTORY  Family History  Social History Married,mother of one son, no current nicotine past alcohol use x 6 month, stopped over 10 years ago  Risk Factors  Current exercise habits:none, limited by severe arthritis  Dietary issues discussed:low carb , low fat diet   Cardiac risk factors: dM, hyperlipidemia  Depression Screen  (Note: if answer to either of the following is "Yes", a more complete depression screening is indicated)   Over the past two weeks, have you felt down, depressed or hopeless? No  Over the past two weeks, have you felt little interest or pleasure in doing things? No  Have you lost interest or pleasure in daily life? No  Do you often feel hopeless? No  Do you cry easily over simple problems? No   Activities of Daily Living  In your present state of health, do you have any difficulty performing the following activities?  Driving?: yes, limited by musculoskeletal probs Managing money?: No Feeding yourself?:No Getting from bed to chair?: yes Climbing a flight of stairs?:yes Preparing food and eating?:yes Bathing or showering?:yes Getting dressed?:yes Getting to the toilet?:yes Using the toilet?:yes Moving around from place to place?: yes  Fall Risk Assessment In the past year have you fallen or had a near fall?:yes Are you currently taking any medications that make you dizziness?:No   Hearing Difficulties: No Do you often ask  people to speak up or repeat themselves?:No Do you experience ringing or noises in your ears?:No Do you have difficulty understanding soft or whispered voices?:yes Cognitive Testing  Alert? Yes Normal Appearance?Yes  Oriented to person? Yes Place? Yes  Time? Yes  Displays appropriate judgment?Yes  Can read the correct time from a watch face? yes Are you having problems remembering things?sometimes  Advanced Directives have been discussed with the patient?Yes    List the Names of Other Physician/Practitioners you currently use: Dr  Eduard Clos, dr Jacklyn Shell ([facility]hto for left shoulder, [facility]), Dr Myrtis Ser, Dr Lita Mains, Dr Ulice Brilliant, dr Orvan Falconer   Indicate any recent Medical Services you may have received from other than [facility] providers in the past year (date may be approximate). [facility] , Dr Jacklyn Shell, and also Dr Eduard Clos, Dr. Lita Mains, eye exam, Dr Ulice Brilliant, podiatry, and Dr. Orvan Falconer, skin  Assessment:    Annual Wellness Exam   Plan:    During the course of the visit the patient was educated and counseled about appropriate screening and preventive services including:  A healthy diet is rich in fruit, vegetables and whole grains. Poultry fish, nuts and beans are a healthy choice for protein rather then red meat. A low sodium diet and drinking 64 ounces of water daily is generally recommended. Oils and sweet should be limited. Carbohydrates especially for those who are diabetic or overweight, should be limited to 30-45 gram per meal. It is important to eat on a regular schedule, at least 3 times daily. Snacks should be primarily fruits, vegetables or nuts. It is important that  you exercise regularly at least 30 minutes 5 times a week. If you develop chest pain, have severe difficulty breathing, or feel very tired, stop exercising immediately and seek medical attention  Immunization reviewed and updated. Cancer screening reviewed and updated    Patient Instructions (the written plan) was given to the patient.    Medicare Attestation  I have personally reviewed:  The patient's medical and social history  Their use of alcohol, tobacco or illicit drugs  Their current medications and supplements  The patient's functional ability including ADLs,fall risks, home safety risks, cognitive, and hearing and visual impairment  Diet and physical activities  Evidence for depression or mood disorders  The patient's weight, height, BMI, and visual acuity have been recorded in the chart. I have made referrals, counseling, and provided education to the patient based on review of the above and I have provided the patient with a written personalized care plan for preventive services.         Review of Systems     Objective:   Physical Exam        Assessment & Plan:

## 2011-09-27 NOTE — Patient Instructions (Addendum)
F/u in mid January, please call if you need me before.  Flu vaccine today.  Blood sugar is too low at times, please cut back on glucovance to one twice daily, (down from 2 twice daily)), also reduce lantus to 20 units daily, down from 25. Please call back if blood sugars are low/high Goal for fasting blood sugar ranges from 90 to 120 and 2 hours after any meal or at bedtime should be between 130 to 170.   Stop niacin, I think you need only crestor and trilipix  You will be referred to [facility] , for home asesment to see if assistance acan be obtained for you re safety in the home and poor mobility, also to see if any arrangement can be made for even once monthly help in the home  HBA1C, fasting lipid, cmp andd EGFR, TSH in mid January before visit

## 2011-09-28 DIAGNOSIS — Z23 Encounter for immunization: Secondary | ICD-10-CM

## 2011-09-30 DIAGNOSIS — Z0000 Encounter for general adult medical examination without abnormal findings: Secondary | ICD-10-CM | POA: Insufficient documentation

## 2011-09-30 NOTE — Assessment & Plan Note (Signed)
Controlled, no change in medication Hyperlipidemia:Low fat diet discussed and encouraged.  Simplify regime d/c niaspan

## 2011-09-30 NOTE — Assessment & Plan Note (Signed)
Controlled, no change in medication DASH diet and commitment to daily physical activity for a minimum of 30 minutes discussed and encouraged, as a part of hypertension management. The importance of attaining a healthy weight is also discussed.  

## 2011-09-30 NOTE — Assessment & Plan Note (Signed)
Over corrected with episodes of hypoglycemia, reduce med dose

## 2011-09-30 NOTE — Assessment & Plan Note (Signed)
Annual wellness performed and documented. Pt is up to date on routine health maintainance including that for a diabetic. She is severely limited by severe arthritis, high fall risk and is limited even in personal care issues. [facility] for any assistance she can get. Depression is also an ongoing issue but has improved in recent times.

## 2011-10-25 ENCOUNTER — Ambulatory Visit (INDEPENDENT_AMBULATORY_CARE_PROVIDER_SITE_OTHER): Payer: Medicare Other | Admitting: Family Medicine

## 2011-10-25 ENCOUNTER — Encounter: Payer: Self-pay | Admitting: Family Medicine

## 2011-10-25 VITALS — BP 142/72 | HR 62 | Resp 18 | Ht 62.0 in | Wt 167.1 lb

## 2011-10-25 DIAGNOSIS — IMO0002 Reserved for concepts with insufficient information to code with codable children: Secondary | ICD-10-CM

## 2011-10-25 DIAGNOSIS — S62309A Unspecified fracture of unspecified metacarpal bone, initial encounter for closed fracture: Secondary | ICD-10-CM

## 2011-10-25 DIAGNOSIS — S6290XA Unspecified fracture of unspecified wrist and hand, initial encounter for closed fracture: Secondary | ICD-10-CM

## 2011-10-25 DIAGNOSIS — I10 Essential (primary) hypertension: Secondary | ICD-10-CM

## 2011-10-25 DIAGNOSIS — S6291XA Unspecified fracture of right wrist and hand, initial encounter for closed fracture: Secondary | ICD-10-CM

## 2011-10-25 NOTE — Progress Notes (Signed)
  Subjective:    Patient ID: [name] [name], female    DOB: Dec 15, 1929, 78 y.o.   MRN: [id number]  HPI Patient here to followup right hand fracture. She was seen in emergency room at [facility] one week ago. Was noted after a fall that there is a linear lucency through the capitate. She was placed in a brace. Note she was previously on fentanyl patch which was started by her pain doctor. She thinks this is causing drowsiness the night that she tripped over her cat and fell towards the sofa. She denies any headache or injury to head. She did have some bruising on her knees but states her knees do not hurt. She's been using oxycodone hydrocodone for pain.   Review of Systems  GEN- denies fatigue, fever, weight loss,weakness, recent illness HEENT- denies eye drainage, change in vision, nasal discharge, CVS- denies chest pain, palpitations RESP- denies SOB, cough, wheeze ABD- denies N/V, change in stools, abd pain GU- denies dysuria, hematuria, dribbling, incontinence MSK- + joint pain, muscle aches, injury Neuro- denies headache, dizziness, syncope, seizure activity      Objective:   Physical Exam GEN- NAD, alert and oriented x3 HEENT- PERRL, EOMI, non injected sclera, pink conjunctiva, MMM, oropharynx clear Neck- Supple,  CVS- RRR, no murmur RESP-CTAB EXT- No edema Skin- bruising bilateral knees and shins, nontender Knee- fair ROM, non tender, no effusion  Right hand- decreased Grasp, TTP at base of thumb and MIP  Pulses- Radial, DP- 2+        Assessment & Plan:

## 2011-10-25 NOTE — Patient Instructions (Signed)
Continue the oxycodone for pain  Referral to orthopedics for hand fracture Keep previous f/u with Dr. Lodema Hong

## 2011-10-26 DIAGNOSIS — S6290XA Unspecified fracture of unspecified wrist and hand, initial encounter for closed fracture: Secondary | ICD-10-CM | POA: Insufficient documentation

## 2011-10-26 NOTE — Assessment & Plan Note (Signed)
Linear fracture of the right capitate found she's been referred to orthopedic surgery for further evaluation she will continue wearing the brace until then

## 2011-10-26 NOTE — Assessment & Plan Note (Signed)
Blood pressure little elevated today. However this is in setting of acute fracture and pain. We'll not change her medications previous blood pressures have looked okay

## 2011-10-26 NOTE — Assessment & Plan Note (Signed)
Followed by pain clinic. Advise her I agree with discontinuance that no patches this may have contributed to her fall. She will continue with her oral medications

## 2011-10-29 ENCOUNTER — Ambulatory Visit (INDEPENDENT_AMBULATORY_CARE_PROVIDER_SITE_OTHER): Payer: Medicare Other | Admitting: Orthopedic Surgery

## 2011-10-29 ENCOUNTER — Encounter: Payer: Self-pay | Admitting: Orthopedic Surgery

## 2011-10-29 VITALS — BP 134/60 | Ht 62.0 in | Wt 167.0 lb

## 2011-10-29 DIAGNOSIS — S60229A Contusion of unspecified hand, initial encounter: Secondary | ICD-10-CM | POA: Insufficient documentation

## 2011-10-29 NOTE — Progress Notes (Signed)
Patient ID: [name] [name], female   DOB: [birth year], 78 y.o.   MRN: [id number] Chief Complaint  Patient presents with  . Hand Injury    right hand fracture, DOI 10/13/11    Referral from Dr. Jeanice Lim  Patient known to me for shoulder problems which are now followed at [facility] presents after falling on October 12 injuring her left hand and wrist. She went for evaluation at the emergency room in [facility] on October 15 and was diagnosed with possible capitate fracture with severe underlying degenerative disease. Now presents for evaluation complaining of sharp intermittent 7/10 pain in her left thumb associated with some bruising along the radial side of the wrist and hand.  Her system review reveals wording of the eyes some constipation occasional frequency joint pain muscle pain itching nervousness, anxiety, depression. Easy bruising. Excessive urination and seasonal allergies  Medical history recorded and reviewed Past Medical History  Diagnosis Date  . Atrial flutter febuary 2008       atrial flutter... DC Cardioversion...successful... holding sinus as of january 2011 coumadin therapy...discontinued...severe hematoma secondary to fall  . Hypertension   . Hyperlipidemia   . Hypercholesterolemia   . Mitral regurgitation 2010    mild ...echo...2010  . Obesity   . Sleep apnea   . Fatigue   . Depression with anxiety   . Visual loss     unspecified  . Basal cell carcinoma   . Hematochezia   . Hematoma     ...buttocks secondary to fall november 2009 with coumadin  coumadin stopped  . Diabetes mellitus     type 2  . Back pain     with radiculopathy  . Arthritis   . Osteoarthritis   . Acute cystitis   . GERD (gastroesophageal reflux disease)   . Diverticulosis of colon   . Constipation   . IBS (irritable bowel syndrome)   . Abdominal bloating   . Nausea   . Erosive esophagitis   . Aortic valve sclerosis     Ef 60 %...echo...july 2010   . Chronic granulomatous  disease     right chest ... ct chest... novmber 2009  . Normal nuclear stress test     september ,2007...no ischemia  . Anticoagulant long-term use     Coumadin  stopped...severe hematoma  from fall  . Edema     Painful, April, 2012  . Ejection fraction     EF 60-65%, echo, April 13, 2010, normal RV function, mild mitral regurgitation    BP 134/60  Ht 5\' 2"  (1.575 m)  Wt 167 lb (75.751 kg)  BMI 30.54 kg/m2 Orthopedics Ortho Exam  The patient is awake alert and oriented x3 her mood and affect are normal. Is normal body habitus. She is ambulatory with a walker with a slow to 8 and slightly flexed posture at the lumbosacral junction. She has deformities of her lower extremities as well as the upper extremities with tenderness in the thumb area bruising and swelling along the radial side of the wrist and thumb painful range of motion of the thumb no bony palpable tenderness but tenderness in the metacarpophalangeal joint. Skin is intact there was a cut over the right ring finger. Sensory exam remains intact she has no lymphadenopathy.  Her coordination and balance are poor. There are no pathologic reflexes and the thumb are upper extremity. All joints in the hand and wrist were stable her muscle tone was good. Chest decreased range of motion of the wrist as  well. There was some swelling in the hand  The x-rays were from [facility] on the disc. There are degenerative changes throughout the hand especially at the basilar joint to the thumb.  Impression contusion  Recommend active range of motion. Remove brace.

## 2011-10-29 NOTE — Patient Instructions (Signed)
Brace if needed until pain subsides

## 2011-12-06 ENCOUNTER — Encounter (HOSPITAL_COMMUNITY): Payer: Self-pay | Admitting: Emergency Medicine

## 2011-12-06 ENCOUNTER — Emergency Department (HOSPITAL_COMMUNITY): Payer: Medicare Other

## 2011-12-06 ENCOUNTER — Emergency Department (HOSPITAL_COMMUNITY)
Admission: EM | Admit: 2011-12-06 | Discharge: 2011-12-06 | Disposition: A | Discharge status: Home / Self Care | Payer: Medicare Other | Attending: Emergency Medicine | Admitting: Emergency Medicine

## 2011-12-06 DIAGNOSIS — H547 Unspecified visual loss: Secondary | ICD-10-CM | POA: Insufficient documentation

## 2011-12-06 DIAGNOSIS — Z9889 Other specified postprocedural states: Secondary | ICD-10-CM | POA: Insufficient documentation

## 2011-12-06 DIAGNOSIS — W19XXXA Unspecified fall, initial encounter: Secondary | ICD-10-CM

## 2011-12-06 DIAGNOSIS — E669 Obesity, unspecified: Secondary | ICD-10-CM | POA: Insufficient documentation

## 2011-12-06 DIAGNOSIS — E7800 Pure hypercholesterolemia, unspecified: Secondary | ICD-10-CM | POA: Insufficient documentation

## 2011-12-06 DIAGNOSIS — Z79899 Other long term (current) drug therapy: Secondary | ICD-10-CM | POA: Insufficient documentation

## 2011-12-06 DIAGNOSIS — Z8659 Personal history of other mental and behavioral disorders: Secondary | ICD-10-CM | POA: Insufficient documentation

## 2011-12-06 DIAGNOSIS — Z794 Long term (current) use of insulin: Secondary | ICD-10-CM | POA: Insufficient documentation

## 2011-12-06 DIAGNOSIS — S0093XA Contusion of unspecified part of head, initial encounter: Secondary | ICD-10-CM

## 2011-12-06 DIAGNOSIS — Y929 Unspecified place or not applicable: Secondary | ICD-10-CM | POA: Insufficient documentation

## 2011-12-06 DIAGNOSIS — E119 Type 2 diabetes mellitus without complications: Secondary | ICD-10-CM | POA: Insufficient documentation

## 2011-12-06 DIAGNOSIS — Z8719 Personal history of other diseases of the digestive system: Secondary | ICD-10-CM | POA: Insufficient documentation

## 2011-12-06 DIAGNOSIS — R51 Headache: Secondary | ICD-10-CM | POA: Insufficient documentation

## 2011-12-06 DIAGNOSIS — G4730 Sleep apnea, unspecified: Secondary | ICD-10-CM | POA: Insufficient documentation

## 2011-12-06 DIAGNOSIS — Z8739 Personal history of other diseases of the musculoskeletal system and connective tissue: Secondary | ICD-10-CM | POA: Insufficient documentation

## 2011-12-06 DIAGNOSIS — I10 Essential (primary) hypertension: Secondary | ICD-10-CM | POA: Insufficient documentation

## 2011-12-06 DIAGNOSIS — Z8639 Personal history of other endocrine, nutritional and metabolic disease: Secondary | ICD-10-CM | POA: Insufficient documentation

## 2011-12-06 DIAGNOSIS — E785 Hyperlipidemia, unspecified: Secondary | ICD-10-CM | POA: Insufficient documentation

## 2011-12-06 DIAGNOSIS — Z8679 Personal history of other diseases of the circulatory system: Secondary | ICD-10-CM | POA: Insufficient documentation

## 2011-12-06 DIAGNOSIS — IMO0002 Reserved for concepts with insufficient information to code with codable children: Secondary | ICD-10-CM | POA: Insufficient documentation

## 2011-12-06 DIAGNOSIS — S0003XA Contusion of scalp, initial encounter: Secondary | ICD-10-CM | POA: Insufficient documentation

## 2011-12-06 DIAGNOSIS — Z7982 Long term (current) use of aspirin: Secondary | ICD-10-CM | POA: Insufficient documentation

## 2011-12-06 DIAGNOSIS — K219 Gastro-esophageal reflux disease without esophagitis: Secondary | ICD-10-CM | POA: Insufficient documentation

## 2011-12-06 DIAGNOSIS — Y939 Activity, unspecified: Secondary | ICD-10-CM | POA: Insufficient documentation

## 2011-12-06 DIAGNOSIS — S8000XA Contusion of unspecified knee, initial encounter: Secondary | ICD-10-CM | POA: Insufficient documentation

## 2011-12-06 DIAGNOSIS — Z862 Personal history of diseases of the blood and blood-forming organs and certain disorders involving the immune mechanism: Secondary | ICD-10-CM | POA: Insufficient documentation

## 2011-12-06 LAB — GLUCOSE, CAPILLARY
Glucose-Capillary: 52 mg/dL — ABNORMAL LOW (ref 70–99)
Glucose-Capillary: 120 mg/dL — ABNORMAL HIGH (ref 70–99)

## 2011-12-06 MED ORDER — DEXTROSE 50 % IV SOLN
12.5000 g | Freq: Once | INTRAVENOUS | Status: AC
  Administered 2011-12-06: 12.5 g via INTRAVENOUS
  Filled 2011-12-06: qty 50

## 2011-12-06 NOTE — ED Notes (Signed)
Pt tripped over mat in parking lot and fell forward striking right forehead and hyper extending neck. Pain also in left shoulder, neck and other generalized pain. No LOC. A&O on arrival to ED. CBG 57.

## 2011-12-06 NOTE — ED Provider Notes (Signed)
History     CSN: [id number]  Arrival date & time 12/06/11  1804   First MD Initiated Contact with Patient 12/06/11 1809      Chief Complaint  Patient presents with  . Head Injury  . Fall    (Consider location/radiation/quality/duration/timing/severity/associated sxs/prior treatment) Patient is a 78 y.o. female presenting with head injury and fall. The history is provided by the patient (the pt states she tripped and fell and hit her head).  Head Injury  The incident occurred 1 to 2 hours ago. She came to the ER via walk-in. The injury mechanism was a direct blow. There was no loss of consciousness. There was no blood loss. The quality of the pain is described as dull. The pain is at a severity of 2/10. The pain is moderate. The pain has been constant since the injury.  Fall Associated symptoms include headaches. Pertinent negatives include no abdominal pain and no hematuria.    Past Medical History  Diagnosis Date  . Atrial flutter febuary 2008       atrial flutter... DC Cardioversion...successful... holding sinus as of january 2011 coumadin therapy...discontinued...severe hematoma secondary to fall  . Hypertension   . Hyperlipidemia   . Hypercholesterolemia   . Mitral regurgitation 2010    mild ...echo...2010  . Obesity   . Sleep apnea   . Fatigue   . Depression with anxiety   . Visual loss     unspecified  . Basal cell carcinoma   . Hematochezia   . Hematoma     ...buttocks secondary to fall november 2009 with coumadin  coumadin stopped  . Diabetes mellitus     type 2  . Back pain     with radiculopathy  . Arthritis   . Osteoarthritis   . Acute cystitis   . GERD (gastroesophageal reflux disease)   . Diverticulosis of colon   . Constipation   . IBS (irritable bowel syndrome)   . Abdominal bloating   . Nausea   . Erosive esophagitis   . Aortic valve sclerosis     Ef 60 %...echo...july 2010   . Chronic granulomatous disease     right chest ... ct chest...  novmber 2009  . Normal nuclear stress test     september ,2007...no ischemia  . Anticoagulant long-term use     Coumadin  stopped...severe hematoma  from fall  . Edema     Painful, April, 2012  . Ejection fraction     EF 60-65%, echo, April 13, 2010, normal RV function, mild mitral regurgitation    Past Surgical History  Procedure Date  . Bilateral bunion removel [id number]  . Bilaterial knee replacement     2000,2001  . Tonsillectomy 1957  . Appendectomy 1948  . Dilation and curettage of uterus 1952 and 2001  . Abdominal hysterectomy 2001  . Cataract extraction 2001    right   . Skin cancer excision   . Cataract extraction     left 2005  . Basal cell carcinoma excision 09/2009    right forarm and back of neck and left side     Family History  Problem Relation Age of Onset  . Dementia Mother   . Heart disease Father   . Diabetes    . Arthritis      History  Substance Use Topics  . Smoking status: Never Smoker   . Smokeless tobacco: Never Used  . Alcohol Use: No    OB History    Grav Para Term  Preterm Abortions TAB SAB Ect Mult Living                  Review of Systems  Constitutional: Negative for fatigue.  HENT: Negative for congestion, sinus pressure and ear discharge.   Eyes: Negative for discharge.  Respiratory: Negative for cough.   Cardiovascular: Negative for chest pain.  Gastrointestinal: Negative for abdominal pain and diarrhea.  Genitourinary: Negative for frequency and hematuria.  Musculoskeletal: Negative for back pain.       Knee pain both  Skin: Negative for rash.  Neurological: Positive for headaches. Negative for seizures.  Hematological: Negative.   Psychiatric/Behavioral: Negative for hallucinations.    Allergies  Alprazolam; Lisinopril; Sertraline hcl; and Sulfonamide derivatives  Home Medications   Current Outpatient Rx  Name  Route  Sig  Dispense  Refill  . ASPIRIN 81 MG PO TABS   Oral   Take 81 mg by mouth daily.            Marland Kitchen BENAZEPRIL-HYDROCHLOROTHIAZIDE 20-12.5 MG PO TABS   Oral   Take 2 tablets by mouth daily.   180 tablet   1   . CITALOPRAM HYDROBROMIDE 40 MG PO TABS   Oral   Take 1 tablet (40 mg total) by mouth daily.   30 tablet   5     Generic JYN:WGNFAO    40MG    . DILTIAZEM HCL ER 240 MG PO CP24   Oral   Take 1 capsule (240 mg total) by mouth daily.   90 capsule   1   . DOXEPIN HCL 10 MG PO CAPS   Oral   Take 10 mg by mouth at bedtime. Take 1-3 at bedtime         . FENOFIBRATE 145 MG PO TABS   Oral   Take 145 mg by mouth daily.         . OMEGA-3 FATTY ACIDS 1000 MG PO CAPS   Oral   Take 1 g by mouth daily. 2 caps bid          . GLYBURIDE-METFORMIN 5-500 MG PO TABS      Dose reduction effective 09/27/2011.  One tablet twice daily   180 tablet   3   . HYDROCODONE-ACETAMINOPHEN 10-500 MG PO TABS      TAKE 1 TABLET BY MOUTH FOUR TIMES DAILY AS NEEDED FOR PAIN   120 tablet   0     Generic For:LOR   10-500MG    . INSULIN GLARGINE 100 UNIT/ML [place] SOLN   Subcutaneous   Inject 20 Units into the skin at bedtime.         . L-METHYLFOLATE-B6-B12 3-35-2 MG PO TABS   Oral   Take 1 tablet by mouth daily.         Marland Kitchen OMEPRAZOLE 20 MG PO CPDR   Oral   Take 20 mg by mouth daily.           Marland Kitchen ROSUVASTATIN CALCIUM 40 MG PO TABS   Oral   Take 1 tablet (40 mg total) by mouth daily.   90 tablet   1     Dispense as written.   Marland Kitchen SOLIFENACIN SUCCINATE 10 MG PO TABS   Oral   Take 1 tablet (10 mg total) by mouth daily.   30 tablet   5     BP 162/61  Pulse 68  Temp 97.9 F (36.6 C) (Oral)  Resp 12  SpO2 97%  Physical Exam  Constitutional: She is oriented  to person, place, and time. She appears well-developed.  HENT:  Head: Normocephalic.       Abrasion and swelling forehead  Eyes: Conjunctivae normal and EOM are normal. No scleral icterus.  Neck: Neck supple. No thyromegaly present.  Cardiovascular: Normal rate and regular rhythm.  Exam reveals no  gallop and no friction rub.   No murmur heard. Pulmonary/Chest: No stridor. She has no wheezes. She has no rales. She exhibits no tenderness.  Abdominal: She exhibits no distension. There is no tenderness. There is no rebound.  Musculoskeletal: Normal range of motion. She exhibits no edema.       brusing both knees  Lymphadenopathy:    She has no cervical adenopathy.  Neurological: She is oriented to person, place, and time. Coordination normal.  Skin: No rash noted. No erythema.  Psychiatric: She has a normal mood and affect. Her behavior is normal.    ED Course  Procedures (including critical care time)  Labs Reviewed  GLUCOSE, CAPILLARY - Abnormal; Notable for the following:    Glucose-Capillary 52 (*)     All other components within normal limits  GLUCOSE, CAPILLARY - Abnormal; Notable for the following:    Glucose-Capillary 120 (*)     All other components within normal limits   Ct Head Wo Contrast  12/06/2011  *RADIOLOGY REPORT*  Clinical Data:  Larey Seat, hitting the right side of the forehead. Headache.  CT HEAD WITHOUT CONTRAST CT CERVICAL SPINE WITHOUT CONTRAST  Technique:  Multidetector CT imaging of the head and cervical spine was performed following the standard protocol without intravenous contrast.  Multiplanar CT image reconstructions of the cervical spine were also generated.  Comparison:   None  CT HEAD  Findings: There is moderate central and cortical atrophy. Periventricular white matter changes are consistent with small vessel disease.  At the midline, there is a small focus of hemorrhage, likely within the corpus callosum. This measures approximately 3 mm in diameter. No other intracranial hemorrhage identified.  There is a large right frontal scalp hematoma, not associated with fracture.  There is significant mucoperiosteal thickening of the ethmoid sinuses.  No air fluid levels are identified to suggest sinus wall fractures.  IMPRESSION:  1.  3 mm area of probable shear  hemorrhage within the corpus callosum. 2.  Large right frontal scalp hematoma without associated calvarial fracture. 3.  Small vessel disease and atrophy.  CT CERVICAL SPINE  Findings: There is significant disc height loss associated large osteophytes at C5-6.  At this level, there is a 4 mm of retrolisthesis.  This causes significant cervical stenosis and appears to be degenerative in nature.  There is anterolisthesis of C4 on C5, also likely degenerative and measuring 3 mm.  Degenerative changes are identified to some degree at all levels. There is significant foraminal narrowing bilaterally at numerous levels.  There is no evidence for acute fracture.  The thyroid gland is enlarged.  There are nodules in the isthmus and right lobe. There is a heterogeneous appearance of the left lobe.  Further evaluation with ultrasound is recommended.  Lung apices are clear.  Note is made of significant carotid calcifications.  IMPRESSION:  1.  Significant spinal stenosis posterior to C5-6, related to degenerative changes.  There is no evidence for acute fracture. 2.  Significant carotid calcifications. 3.  Thyroid nodules warranting further evaluation with thyroid ultrasound.  This can be performed as an outpatient.  Critical test results telephoned to Dr. Estell Harpin at the time of interpretation on date 12/06/2011  at time 7:33 p.m. .   Original Report Authenticated By: Norva Pavlov, M.D.    Ct Cervical Spine Wo Contrast  12/06/2011  *RADIOLOGY REPORT*  Clinical Data:  Larey Seat, hitting the right side of the forehead. Headache.  CT HEAD WITHOUT CONTRAST CT CERVICAL SPINE WITHOUT CONTRAST  Technique:  Multidetector CT imaging of the head and cervical spine was performed following the standard protocol without intravenous contrast.  Multiplanar CT image reconstructions of the cervical spine were also generated.  Comparison:   None  CT HEAD  Findings: There is moderate central and cortical atrophy. Periventricular white matter  changes are consistent with small vessel disease.  At the midline, there is a small focus of hemorrhage, likely within the corpus callosum. This measures approximately 3 mm in diameter. No other intracranial hemorrhage identified.  There is a large right frontal scalp hematoma, not associated with fracture.  There is significant mucoperiosteal thickening of the ethmoid sinuses.  No air fluid levels are identified to suggest sinus wall fractures.  IMPRESSION:  1.  3 mm area of probable shear hemorrhage within the corpus callosum. 2.  Large right frontal scalp hematoma without associated calvarial fracture. 3.  Small vessel disease and atrophy.  CT CERVICAL SPINE  Findings: There is significant disc height loss associated large osteophytes at C5-6.  At this level, there is a 4 mm of retrolisthesis.  This causes significant cervical stenosis and appears to be degenerative in nature.  There is anterolisthesis of C4 on C5, also likely degenerative and measuring 3 mm.  Degenerative changes are identified to some degree at all levels. There is significant foraminal narrowing bilaterally at numerous levels.  There is no evidence for acute fracture.  The thyroid gland is enlarged.  There are nodules in the isthmus and right lobe. There is a heterogeneous appearance of the left lobe.  Further evaluation with ultrasound is recommended.  Lung apices are clear.  Note is made of significant carotid calcifications.  IMPRESSION:  1.  Significant spinal stenosis posterior to C5-6, related to degenerative changes.  There is no evidence for acute fracture. 2.  Significant carotid calcifications. 3.  Thyroid nodules warranting further evaluation with thyroid ultrasound.  This can be performed as an outpatient.  Critical test results telephoned to Dr. Estell Harpin at the time of interpretation on date 12/06/2011 at time 7:33 p.m. .   Original Report Authenticated By: Norva Pavlov, M.D.    Dg Knee Complete 4 Views Right  12/06/2011   *RADIOLOGY REPORT*  Clinical Data: Larey Seat.  Landed on the knee.  RIGHT KNEE - COMPLETE 4+ VIEW  Comparison: 06/04/2011  Findings: The patient has had knee arthroplasty.  There is no evidence for acute fracture or subluxation.  Small joint effusion is present.  There is significant soft tissue thickening of the anterior knee.  Dense atherosclerotic calcification is identified in the distal femoral and popliteal artery.  IMPRESSION:  1.  Significant soft tissue swelling and small joint effusion. 2.  No evidence for acute fracture.   Original Report Authenticated By: Norva Pavlov, M.D.      1. Head contusion   2. Knee contusion   3. Fall      I spoke with dr. Venetia Maxon and he stated the pt could go home and follow up with him if any problems MDM          Benny Lennert, MD 12/06/11 2013

## 2011-12-11 ENCOUNTER — Other Ambulatory Visit: Payer: Self-pay | Admitting: Family Medicine

## 2011-12-12 ENCOUNTER — Telehealth: Payer: Self-pay | Admitting: Family Medicine

## 2011-12-12 NOTE — Telephone Encounter (Signed)
Pls refer pt to [facility] for home health physical therapy to eval for sfety and gait training , she has had multiple falls and has severe arthritis. Duration 4  Weeks twice weekly. pls let Caroll spinks of [facility] know pt is referred , just fax the note I wrote

## 2011-12-14 NOTE — Telephone Encounter (Signed)
Referral sent to [facility] and [facility] notified.

## 2011-12-17 ENCOUNTER — Other Ambulatory Visit: Payer: Self-pay | Admitting: Family Medicine

## 2011-12-17 DIAGNOSIS — Z139 Encounter for screening, unspecified: Secondary | ICD-10-CM

## 2012-01-10 ENCOUNTER — Ambulatory Visit (HOSPITAL_COMMUNITY): Payer: Medicare Other

## 2012-01-11 ENCOUNTER — Other Ambulatory Visit: Payer: Self-pay | Admitting: Family Medicine

## 2012-01-11 LAB — CBC WITH DIFFERENTIAL/PLATELET
Basophils Absolute: 0 10*3/uL (ref 0.0–0.1)
Eosinophils Absolute: 0.3 10*3/uL (ref 0.0–0.7)
Eosinophils Relative: 4 % (ref 0–5)
Lymphocytes Relative: 28 % (ref 12–46)
MCH: 30.1 pg (ref 26.0–34.0)
MCV: 90.4 fL (ref 78.0–100.0)
Neutrophils Relative %: 60 % (ref 43–77)
Platelets: 245 10*3/uL (ref 150–400)
RDW: 13.4 % (ref 11.5–15.5)
WBC: 8.9 10*3/uL (ref 4.0–10.5)

## 2012-01-11 LAB — LIPID PANEL
Total CHOL/HDL Ratio: 2.5 Ratio
VLDL: 18 mg/dL (ref 0–40)

## 2012-01-11 LAB — TSH: TSH: 4.811 u[IU]/mL — ABNORMAL HIGH (ref 0.350–4.500)

## 2012-01-12 LAB — COMPLETE METABOLIC PANEL WITH GFR
AST: 21 U/L (ref 0–37)
Albumin: 4.6 g/dL (ref 3.5–5.2)
Alkaline Phosphatase: 46 U/L (ref 39–117)
BUN: 37 mg/dL — ABNORMAL HIGH (ref 6–23)
Potassium: 5.2 mEq/L (ref 3.5–5.3)
Sodium: 143 mEq/L (ref 135–145)
Total Bilirubin: 0.3 mg/dL (ref 0.3–1.2)
Total Protein: 7.3 g/dL (ref 6.0–8.3)

## 2012-01-14 ENCOUNTER — Ambulatory Visit (INDEPENDENT_AMBULATORY_CARE_PROVIDER_SITE_OTHER): Payer: BC Managed Care – PPO | Admitting: Family Medicine

## 2012-01-14 VITALS — BP 164/70 | HR 71 | Resp 16 | Ht 62.0 in | Wt 173.1 lb

## 2012-01-14 DIAGNOSIS — E119 Type 2 diabetes mellitus without complications: Secondary | ICD-10-CM

## 2012-01-14 DIAGNOSIS — E1140 Type 2 diabetes mellitus with diabetic neuropathy, unspecified: Secondary | ICD-10-CM

## 2012-01-14 DIAGNOSIS — Z9181 History of falling: Secondary | ICD-10-CM

## 2012-01-14 DIAGNOSIS — E1149 Type 2 diabetes mellitus with other diabetic neurological complication: Secondary | ICD-10-CM

## 2012-01-14 DIAGNOSIS — I10 Essential (primary) hypertension: Secondary | ICD-10-CM

## 2012-01-14 DIAGNOSIS — R0989 Other specified symptoms and signs involving the circulatory and respiratory systems: Secondary | ICD-10-CM

## 2012-01-14 DIAGNOSIS — R296 Repeated falls: Secondary | ICD-10-CM

## 2012-01-14 DIAGNOSIS — R32 Unspecified urinary incontinence: Secondary | ICD-10-CM

## 2012-01-14 DIAGNOSIS — E1142 Type 2 diabetes mellitus with diabetic polyneuropathy: Secondary | ICD-10-CM

## 2012-01-14 DIAGNOSIS — E785 Hyperlipidemia, unspecified: Secondary | ICD-10-CM

## 2012-01-14 DIAGNOSIS — K219 Gastro-esophageal reflux disease without esophagitis: Secondary | ICD-10-CM

## 2012-01-14 DIAGNOSIS — F329 Major depressive disorder, single episode, unspecified: Secondary | ICD-10-CM

## 2012-01-14 MED ORDER — DILTIAZEM HCL ER 240 MG PO CP24: 240.0000 mg | ORAL_CAPSULE | Freq: Every day | ORAL | Status: DC

## 2012-01-14 MED ORDER — OMEPRAZOLE 20 MG PO CPDR: 20.0000 mg | DELAYED_RELEASE_CAPSULE | Freq: Every day | ORAL | Status: DC

## 2012-01-14 MED ORDER — GLYBURIDE-METFORMIN 5-500 MG PO TABS: 1.0000 | ORAL_TABLET | Freq: Every day | ORAL | Status: DC

## 2012-01-14 NOTE — Patient Instructions (Addendum)
F/u in 5 weeks.Call if you need me before  Reduce glyburide/metformin to ONE daily, and keep insulin at 15 units.  Fasting blood sugar between 100 to 130 is good.Call with concerns  You are being referred for test to ensure no blockage in neck arteries.  You are referred to physical therapy in [place], for gait evaluation and help to reduce falls.  You are referred to dr Gerilyn Pilgrim for nerve conduction tests, and evaluation for multiple falls  Vit B12 is added to labs you recently had, we will contact you with result. OK to take the cocnut oil capsule , when finished,just eat coconut, 1 per week, purest way to get this, if you want to continue Doxepin 10mg  only is the dose to use , since this works for you

## 2012-01-16 LAB — T3, FREE: T3, Free: 2.8 pg/mL (ref 2.3–4.2)

## 2012-01-17 ENCOUNTER — Ambulatory Visit (HOSPITAL_COMMUNITY)
Admission: RE | Admit: 2012-01-17 | Discharge: 2012-01-17 | Disposition: A | Discharge status: Home / Self Care | Payer: Medicare Other | Source: Ambulatory Visit | Attending: Family Medicine | Admitting: Family Medicine

## 2012-01-17 DIAGNOSIS — Z1231 Encounter for screening mammogram for malignant neoplasm of breast: Secondary | ICD-10-CM | POA: Insufficient documentation

## 2012-01-17 DIAGNOSIS — Z139 Encounter for screening, unspecified: Secondary | ICD-10-CM

## 2012-02-01 ENCOUNTER — Other Ambulatory Visit: Payer: Self-pay | Admitting: Family Medicine

## 2012-02-01 MED ORDER — RISEDRONATE SODIUM 35 MG PO TABS: 35.0000 mg | ORAL_TABLET | ORAL | Status: DC

## 2012-02-15 ENCOUNTER — Encounter: Payer: Self-pay | Admitting: Family Medicine

## 2012-02-15 DIAGNOSIS — R296 Repeated falls: Secondary | ICD-10-CM | POA: Insufficient documentation

## 2012-02-15 NOTE — Assessment & Plan Note (Signed)
Elevated at [facility] visit, no med change at this time

## 2012-02-15 NOTE — Assessment & Plan Note (Signed)
H/o unsteady gait and multiple falls, needs neuro eval and PT eval, referrals entered

## 2012-02-15 NOTE — Assessment & Plan Note (Signed)
Controlled, no change in medication  

## 2012-02-15 NOTE — Assessment & Plan Note (Signed)
Controlled, no change in medication Hyperlipidemia:Low fat diet discussed and encouraged.  \ 

## 2012-02-15 NOTE — Progress Notes (Signed)
  Subjective:    Patient ID: [name] [name], female    DOB: [birth year], 79 y.o.   MRN: [id number]  HPI The PT is here for follow up and re-evaluation of chronic medical conditions, medication management and review of any available recent lab and radiology data.  Preventive health is updated, specifically  Cancer screening and Immunization.    The PT denies any adverse reactions to current medications since the last visit.  C/o unsteady gait an repeated falls, also experiences light headedness and low blood sugar at times   Review of Systems See HPI Denies recent fever or chills. Denies sinus pressure, nasal congestion, ear pain or sore throat. Denies chest congestion, productive cough or wheezing. Denies chest pains, palpitations and leg swelling Denies abdominal pain, nausea, vomiting,diarrhea or constipation.   Denies dysuria, frequency, hesitancy or incontinence.  Denies headaches, seizures, numbness, or tingling. Denies uncontrolled depression, anxiety or insomnia. Denies skin break down or rash.        Objective:   Physical Exam  Patient alert and oriented and in no cardiopulmonary distress.  HEENT: No facial asymmetry, EOMI, no sinus tenderness,  oropharynx pink and moist.  Neck decreased ROM no adenopathy.Carotid briut  Chest: Clear to auscultation bilaterally.  CVS: S1, S2 no murmurs, no S3.  ABD: Soft non tender. Bowel sounds normal.  Ext: No edema  MS: decreased  ROM spine, shoulders, hips and knees.  Skin: Intact, no ulcerations or rash noted.  Psych: Good eye contact, normal affect. Memory intact not anxious or depressed appearing.  CNS: CN 2-12 intact, power,  normal throughout.       Assessment & Plan:

## 2012-02-15 NOTE — Assessment & Plan Note (Signed)
Over corrected, based on pt age, h/o reptd falls and light headedness, reduce med dose

## 2012-02-18 ENCOUNTER — Ambulatory Visit (INDEPENDENT_AMBULATORY_CARE_PROVIDER_SITE_OTHER): Payer: Medicare Other | Admitting: Family Medicine

## 2012-02-18 ENCOUNTER — Ambulatory Visit (HOSPITAL_COMMUNITY): Payer: Medicare Other | Attending: Family Medicine

## 2012-02-18 ENCOUNTER — Encounter: Payer: Self-pay | Admitting: Family Medicine

## 2012-02-18 VITALS — BP 150/80 | HR 61 | Resp 16 | Ht 62.0 in | Wt 174.8 lb

## 2012-02-18 DIAGNOSIS — R0989 Other specified symptoms and signs involving the circulatory and respiratory systems: Secondary | ICD-10-CM

## 2012-02-18 DIAGNOSIS — R296 Repeated falls: Secondary | ICD-10-CM

## 2012-02-18 DIAGNOSIS — Z9181 History of falling: Secondary | ICD-10-CM

## 2012-02-18 DIAGNOSIS — IMO0002 Reserved for concepts with insufficient information to code with codable children: Secondary | ICD-10-CM

## 2012-02-18 DIAGNOSIS — I10 Essential (primary) hypertension: Secondary | ICD-10-CM

## 2012-02-18 DIAGNOSIS — E119 Type 2 diabetes mellitus without complications: Secondary | ICD-10-CM

## 2012-02-18 DIAGNOSIS — F329 Major depressive disorder, single episode, unspecified: Secondary | ICD-10-CM

## 2012-02-18 MED ORDER — ROSUVASTATIN CALCIUM 40 MG PO TABS: 40.0000 mg | ORAL_TABLET | Freq: Every day | ORAL | Status: DC

## 2012-02-18 NOTE — Patient Instructions (Addendum)
F/u first week in May, please call if you need me before  Please discuss the stiffness in right foot and ankle with you podiatrist when you see him  Blood pressure is better, continue current medication  Blood sugar has improved, I suggest you reduce the lantus to 12 units daily, if blood sugars get too high, then increase again to 15 units, but based on what I see, I think that the lower dose is enough. You should not have to "eat to keep your blood sugar up"  Non fasting HBA1c, cmp and EGFR in May, before visit  Microalb from the office today  You will be referred to [facility] for the carotid ultrasound.  You have been referred to physical therapy in [place] as well as to see Dr Gerilyn Pilgrim for recurrent falls and unsteady gait

## 2012-02-18 NOTE — Progress Notes (Signed)
  Subjective:    Patient ID: [name] [name], female    DOB: [birth year], 79 y.o.   MRN: [id number]  HPI Pt in primarily for f/u uncontrolled diabetes, recently her medication dose for diabetes was reduced, since her HBa1C was 6.7 and she was having hypoglycemic episodes. Her record today still shows that at times, her morning sugars are as low as in the 70's , so she does need to have an even greater med reduction. Generally she feels better since the dose has been adjusted. Pt just now getting referrals done for her gait disturbance, she will have the ultrasound at [facility] as well C/o right foot weakness in the ankle with stiffness and difficulty moving the toes on the right foot States she seems to have been falling less but wants to pursue the physical therapy eval as welll as the neurology eval   Review of Systems See HPI Denies recent fever or chills. Denies sinus pressure, nasal congestion, ear pain or sore throat. Denies chest congestion, productive cough or wheezing. Denies chest pains, palpitations and leg swelling Denies abdominal pain, nausea, vomiting,diarrhea or constipation.   Denies dysuria, frequency, hesitancy or incontinence. Chronic  joint pain, swelling and limitation in mobility. Denies headaches, seizures, numbness, or tingling. Denies depression, anxiety or insomnia. Denies skin break down or rash.        Objective:   Physical Exam  Patient alert and oriented and in no cardiopulmonary distress.  HEENT: No facial asymmetry, EOMI, no sinus tenderness,  oropharynx pink and moist.  Neck decreased ROM no adenopathy.carotid bruit  Chest: Clear to auscultation bilaterally.  CVS: S1, S2  , no S3.  ABD: Soft non tender. Bowel sounds normal.  Ext: No edema  WU:[id number]  ROM spine, shoulders, hips and knees.  Skin: Intact, no ulcerations or rash noted.  Psych: Good eye contact, normal affect. Memory intact not anxious or depressed appearing.  CNS: CN 2-12  intact, power, tone and sensation normal throughout.       Assessment & Plan:

## 2012-02-19 ENCOUNTER — Telehealth: Payer: Self-pay | Admitting: Family Medicine

## 2012-02-19 LAB — MICROALBUMIN / CREATININE URINE RATIO: Microalb, Ur: 0.63 mg/dL (ref 0.00–1.89)

## 2012-02-19 NOTE — Assessment & Plan Note (Signed)
Sub optimal control, no change in medication DASH diet and commitment to daily physical activity for a minimum of 30 minutes discussed and encouraged, as a part of hypertension management. The importance of attaining a healthy weight is also discussed.   

## 2012-02-19 NOTE — Assessment & Plan Note (Signed)
Controlled, no change in medication  

## 2012-02-19 NOTE — Assessment & Plan Note (Signed)
Will have PT eval and treatment for 4 to 6 weeks

## 2012-02-19 NOTE — Assessment & Plan Note (Signed)
Over corrected, but improved blood sugars on lower dose of medication, still believe she can do well on lower dose of med and have asked her to lower it, she reports eating to keep her blood sugars up

## 2012-02-19 NOTE — Assessment & Plan Note (Signed)
Unchanged, chronic pain management through the pain clinic

## 2012-02-19 NOTE — Assessment & Plan Note (Signed)
Pt to have carotid study rescheduled in [facility]

## 2012-02-20 ENCOUNTER — Telehealth: Payer: Self-pay | Admitting: Family Medicine

## 2012-02-20 NOTE — Telephone Encounter (Signed)
Patient is aware 

## 2012-02-21 NOTE — Telephone Encounter (Signed)
Patient is aware 

## 2012-02-22 ENCOUNTER — Telehealth: Payer: Self-pay | Admitting: Family Medicine

## 2012-02-22 NOTE — Telephone Encounter (Signed)
Pls advise carotid study shows some plaque /thickening of arteries in neck, but not enough that she will need to have surgery , just continue to keep good control of cholesterol and diabetes, also aspirin one daily, 81mg 

## 2012-02-27 NOTE — Telephone Encounter (Signed)
Patient aware.

## 2012-03-11 ENCOUNTER — Telehealth: Payer: Self-pay | Admitting: Family Medicine

## 2012-03-11 ENCOUNTER — Other Ambulatory Visit: Payer: Self-pay

## 2012-03-11 DIAGNOSIS — I10 Essential (primary) hypertension: Secondary | ICD-10-CM

## 2012-03-11 MED ORDER — BENAZEPRIL-HYDROCHLOROTHIAZIDE 20-12.5 MG PO TABS: 2.0000 | ORAL_TABLET | Freq: Every day | ORAL | Status: DC

## 2012-03-11 NOTE — Telephone Encounter (Signed)
Med refilled.

## 2012-03-12 ENCOUNTER — Encounter: Payer: Self-pay | Admitting: Family Medicine

## 2012-03-12 ENCOUNTER — Other Ambulatory Visit: Payer: Self-pay

## 2012-03-12 MED ORDER — INSULIN GLARGINE 100 UNIT/ML ~~LOC~~ SOLN: 15.0000 [IU] | Freq: Every day | SUBCUTANEOUS | Status: DC

## 2012-03-17 ENCOUNTER — Encounter: Payer: Self-pay | Admitting: Cardiology

## 2012-03-17 DIAGNOSIS — I739 Peripheral vascular disease, unspecified: Secondary | ICD-10-CM

## 2012-03-17 DIAGNOSIS — I779 Disorder of arteries and arterioles, unspecified: Secondary | ICD-10-CM | POA: Insufficient documentation

## 2012-03-21 ENCOUNTER — Ambulatory Visit (INDEPENDENT_AMBULATORY_CARE_PROVIDER_SITE_OTHER): Payer: Medicare Other | Admitting: Cardiology

## 2012-03-21 ENCOUNTER — Encounter: Payer: Self-pay | Admitting: Cardiology

## 2012-03-21 VITALS — BP 154/87 | HR 62 | Ht 62.0 in | Wt 174.4 lb

## 2012-03-21 DIAGNOSIS — I4892 Unspecified atrial flutter: Secondary | ICD-10-CM

## 2012-03-21 DIAGNOSIS — I779 Disorder of arteries and arterioles, unspecified: Secondary | ICD-10-CM

## 2012-03-21 DIAGNOSIS — I10 Essential (primary) hypertension: Secondary | ICD-10-CM

## 2012-03-21 NOTE — Progress Notes (Signed)
HPI  The patient is seen for followup for history of atrial flutter. In the past she had an episode. Eventually she was cardioverted successfully. She was treated with Coumadin for a period of time. Coumadin was stopped when she began having problems with falling. She has not had any recurrent episode of atrial flutter. She's not having any significant symptoms. I saw her last November, 2012.  Allergies  Allergen Reactions  . Alprazolam Other (See Comments)    hyper  . Lisinopril Nausea And Vomiting  . Sertraline Hcl     REACTION: diarrhea  . Sulfonamide Derivatives Rash    Current Outpatient Prescriptions  Medication Sig Dispense Refill  . aspirin 81 MG tablet Take 81 mg by mouth daily.        . benazepril-hydrochlorthiazide (LOTENSIN HCT) 20-12.5 MG per tablet Take 2 tablets by mouth daily.  180 tablet  1  . citalopram (CELEXA) 40 MG tablet Take 1 tablet (40 mg total) by mouth daily.  30 tablet  5  . diltiazem (DILACOR XR) 240 MG 24 hr capsule Take 1 capsule (240 mg total) by mouth daily.  30 capsule  5  . doxepin (SINEQUAN) 10 MG capsule Take 10 mg by mouth at bedtime. Take 1-3 at bedtime      . fenofibrate (TRICOR) 145 MG tablet TAKE 1 TABLET BY MOUTH AT BEDTIME  30 tablet  5  . fish oil-omega-3 fatty acids 1000 MG capsule Take 1 g by mouth daily. 2 caps bid       . glyBURIDE-metformin (GLUCOVANCE) 5-500 MG per tablet Take 1 tablet by mouth daily with breakfast.  30 tablet  5  . HYDROcodone-acetaminophen (LORTAB) 10-500 MG per tablet TAKE 1 TABLET BY MOUTH FOUR TIMES DAILY AS NEEDED FOR PAIN  120 tablet  0  . insulin glargine (LANTUS) 100 UNIT/ML injection Inject 15 Units into the skin at bedtime.  10 mL  3  . l-methylfolate-B6-B12 (METANX) 3-35-2 MG TABS Take 1 tablet by mouth daily.      Marland Kitchen omeprazole (PRILOSEC) 20 MG capsule Take 1 capsule (20 mg total) by mouth daily.  30 capsule  5  . risedronate (ACTONEL) 35 MG tablet Take 1 tablet (35 mg total) by mouth every 7 (seven) days.  with water on empty stomach, nothing by mouth or lie down for next 30 minutes.  4 tablet  5  . rosuvastatin (CRESTOR) 40 MG tablet Take 1 tablet (40 mg total) by mouth daily.  30 tablet  3  . solifenacin (VESICARE) 10 MG tablet Take 1 tablet (10 mg total) by mouth daily.  30 tablet  5   No current facility-administered medications for this visit.    History   Social History  . Marital Status: Married    Spouse Name: N/A    Number of Children: N/A  . Years of Education: 13   Occupational History  . retired    Social History Main Topics  . Smoking status: Never Smoker   . Smokeless tobacco: Never Used  . Alcohol Use: No  . Drug Use: No  . Sexually Active: Not on file   Other Topics Concern  . Not on file   Social History Narrative  . No narrative on file    Family History  Problem Relation Age of Onset  . Dementia Mother   . Heart disease Father   . Diabetes    . Arthritis      Past Medical History  Diagnosis Date  . Atrial flutter  febuary 2008       atrial flutter... DC Cardioversion...successful... holding sinus as of january 2011 coumadin therapy...discontinued...severe hematoma secondary to fall  . Hypertension   . Hyperlipidemia   . Hypercholesterolemia   . Mitral regurgitation 2010    mild ...echo...2010  . Obesity   . Sleep apnea   . Fatigue   . Depression with anxiety   . Visual loss     unspecified  . Basal cell carcinoma   . Hematochezia   . Hematoma     ...buttocks secondary to fall november 2009 with coumadin  coumadin stopped  . Diabetes mellitus     type 2  . Back pain     with radiculopathy  . Arthritis   . Osteoarthritis   . Acute cystitis   . GERD (gastroesophageal reflux disease)   . Diverticulosis of colon   . Constipation   . IBS (irritable bowel syndrome)   . Abdominal bloating   . Nausea   . Erosive esophagitis   . Aortic valve sclerosis     Ef 60 %...echo...july 2010   . Chronic granulomatous disease     right chest ...  ct chest... novmber 2009  . Normal nuclear stress test     september ,2007...no ischemia  . Anticoagulant long-term use     Coumadin  stopped...severe hematoma  from fall  . Edema     Painful, April, 2012  . Ejection fraction     EF 60-65%, echo, April 13, 2010, normal RV function, mild mitral regurgitation  . Carotid artery disease     Doppler, [facility], February 21, 2012, less than 50% bilateral carotid stenoses    Past Surgical History  Procedure Laterality Date  . Bilateral bunion removel  [id number]  . Bilaterial knee replacement      2000,2001  . Tonsillectomy  1957  . Appendectomy  1948  . Dilation and curettage of uterus  1952 and 2001  . Abdominal hysterectomy  2001  . Cataract extraction  2001    right   . Skin cancer excision    . Cataract extraction      left 2005  . Basal cell carcinoma excision  09/2009    right forarm and back of neck and left side     Patient Active Problem List  Diagnosis  . CARCINOMA, BASAL CELL  . DIABETES MELLITUS, TYPE II  . OBESITY, UNSPECIFIED  . ANXIETY  . DEPRESSION  . UNSPECIFIED VISUAL LOSS  . MITRAL REGURGITATION, 0 (MILD)  . HYPERTENSION  . ATRIAL FLUTTER  . EROSIVE ESOPHAGITIS  . GERD  . DIVERTICULOSIS, COLON  . IRRITABLE BOWEL SYNDROME  . OSTEOARTHRITIS  . ARTHRITIS  . BACK PAIN WITH RADICULOPATHY  . SLEEP APNEA  . FATIGUE  . HEMATOCHEZIA, HX OF  . Hyperlipidemia  . Aortic valve sclerosis  . Normal nuclear stress test  . Ejection fraction  . Hip pain, left  . Urinary incontinence  . Shoulder pain  . Arthritis, shoulder region  . Torn rotator cuff, left  . Routine general medical examination at a health care facility  . Hand fracture  . Contusion, hand  . Multiple falls  . Carotid artery disease    ROS   Patient denies fever, chills, headache, sweats, rash, change in vision, change in hearing, chest pain, cough, nausea vomiting, urinary symptoms. She does have arthritic problems.  PHYSICAL  EXAM  Patient is oriented to person time and place. Affect is normal. Lungs are clear. Respiratory effort is  nonlabored. Cardiac exam reveals S1 and S2. There no clicks or significant murmurs. The abdomen is soft. There is no peripheral edema. She is here with a walker today.  Filed Vitals:   03/21/12 1253  BP: 154/87  Pulse: 62  Height: 5\' 2"  (1.575 m)  Weight: 174 lb 6.4 oz (79.107 kg)   EKG is done today and reviewed by me. There is normal sinus rhythm.  ASSESSMENT & PLAN

## 2012-03-21 NOTE — Assessment & Plan Note (Signed)
Patient had followup carotid Dopplers in February, 2014. She has less than 50% bilateral disease.

## 2012-03-21 NOTE — Assessment & Plan Note (Signed)
Systolic pressure is mildly elevated today. She meets guidelines for her age. No change in therapy.

## 2012-03-21 NOTE — Assessment & Plan Note (Signed)
Patient has not had any recurrent atrial flutter. No further workup.

## 2012-03-21 NOTE — Patient Instructions (Addendum)
Your physician recommends that you schedule a follow-up appointment in: 1&1/2 year. You will receive a reminder letter in the mail in about 15 months reminding you to call and schedule your appointment. If you don't receive this letter, please contact our office. Your physician recommends that you continue on your current medications as directed. Please refer to the Current Medication list given to you today.

## 2012-04-14 ENCOUNTER — Telehealth: Payer: Self-pay | Admitting: Family Medicine

## 2012-04-16 NOTE — Telephone Encounter (Signed)
Verbal given to company

## 2012-05-03 LAB — HEMOGLOBIN A1C: Hgb A1c MFr Bld: 6.7 % — ABNORMAL HIGH (ref <5.7)

## 2012-05-04 LAB — COMPLETE METABOLIC PANEL WITH GFR
BUN: 25 mg/dL — ABNORMAL HIGH (ref 6–23)
CO2: 29 mEq/L (ref 19–32)
Calcium: 9.1 mg/dL (ref 8.4–10.5)
Chloride: 103 mEq/L (ref 96–112)
Creat: 1.08 mg/dL (ref 0.50–1.10)
GFR, Est African American: 56 mL/min — ABNORMAL LOW
GFR, Est Non African American: 49 mL/min — ABNORMAL LOW

## 2012-05-06 ENCOUNTER — Encounter: Payer: Self-pay | Admitting: Family Medicine

## 2012-05-06 ENCOUNTER — Ambulatory Visit (INDEPENDENT_AMBULATORY_CARE_PROVIDER_SITE_OTHER): Payer: Medicare Other | Admitting: Family Medicine

## 2012-05-06 VITALS — BP 144/80 | HR 60 | Resp 16 | Ht 62.0 in | Wt 176.0 lb

## 2012-05-06 DIAGNOSIS — F411 Generalized anxiety disorder: Secondary | ICD-10-CM

## 2012-05-06 DIAGNOSIS — F3289 Other specified depressive episodes: Secondary | ICD-10-CM

## 2012-05-06 DIAGNOSIS — Q782 Osteopetrosis: Secondary | ICD-10-CM

## 2012-05-06 DIAGNOSIS — R5383 Other fatigue: Secondary | ICD-10-CM

## 2012-05-06 DIAGNOSIS — Z79899 Other long term (current) drug therapy: Secondary | ICD-10-CM

## 2012-05-06 DIAGNOSIS — R5381 Other malaise: Secondary | ICD-10-CM

## 2012-05-06 DIAGNOSIS — E119 Type 2 diabetes mellitus without complications: Secondary | ICD-10-CM

## 2012-05-06 DIAGNOSIS — N390 Urinary tract infection, site not specified: Secondary | ICD-10-CM

## 2012-05-06 DIAGNOSIS — I10 Essential (primary) hypertension: Secondary | ICD-10-CM

## 2012-05-06 DIAGNOSIS — E785 Hyperlipidemia, unspecified: Secondary | ICD-10-CM

## 2012-05-06 DIAGNOSIS — F329 Major depressive disorder, single episode, unspecified: Secondary | ICD-10-CM

## 2012-05-06 LAB — POCT URINALYSIS DIPSTICK
Protein, UA: NEGATIVE
Urobilinogen, UA: 0.2

## 2012-05-06 MED ORDER — GLYBURIDE-METFORMIN 5-500 MG PO TABS: 1.0000 | ORAL_TABLET | Freq: Two times a day (BID) | ORAL | Status: DC

## 2012-05-06 NOTE — Progress Notes (Signed)
  Subjective:    Patient ID: [name] [name], female    DOB: 1929-06-18, 79 y.o.   MRN: [id number]  HPI The PT is here for follow up and re-evaluation of chronic medical conditions, medication management and review of any available recent lab and radiology data.  Preventive health is updated, specifically  Cancer screening and Immunization.   Questions or concerns regarding consultations or procedures which the PT has had in the interim are  Addressed.Recently saw podiatry also cardiology and neurology. Has insert prescribed for foot not filled yet, was told by neurology she has had a stroke in the past,cardiology has no new concerns The PT c/o actonel causing increased bone pain and has stopped this  3 week h/o dysuria and frequency, uses med for incontinence, no flank pain or fever Ambulating with walker no new falls , chronic back pain unchanged      Review of Systems See HPI Denies recent fever or chills. Denies sinus pressure, nasal congestion, ear pain or sore throat. Denies chest congestion, productive cough or wheezing. Denies chest pains, palpitations and leg swelling Denies abdominal pain, nausea, vomiting,diarrhea or constipation.   .  Denies headaches, seizures, numbness, or tingling. Denies uncontrolled  depression, anxiety or insomnia. Denies skin break down or rash.        Objective:   Physical Exam Patient alert and oriented and in no cardiopulmonary distress.  HEENT: No facial asymmetry, EOMI, no sinus tenderness,  oropharynx pink and moist.  Neck decreased though adequate ROM, no adenopathy.  Chest: Clear to auscultation bilaterally.  CVS: S1, S2 no murmurs, no S3.  ABD: Soft non tender. Bowel sounds normal.  Ext: No edema  MS: Decreased ROM spine, shoulders, hips and knees.  Skin: Intact, no ulcerations or rash noted.  Psych: Good eye contact, normal affect. Memory intact not anxious or depressed appearing.  CNS: CN 2-12 intact, power, normal  throughout.        Assessment & Plan:

## 2012-05-06 NOTE — Patient Instructions (Addendum)
F/u in 4.month, please call if you need me before   You are referred for bone density scan  Please stop actonel since it is causing bone pain in your opinion   Urine being tested for infection, if none , you will be referred to urology for evaluation and management of frequent painful urination  Stop lantus.  Take one in the morning  and a half in the evening  glyburide metformin daly instead. New script will say one twice daily. After three weeks if bloood sugars still out of range call, then we will increase to one twice daily as prescribed OK to test twice daily for the next 4 to 6 weeks until blood sugars are stabilized. If good result at next visit off of insulin will reduce to once daily  Fasting lipid, cmop and EGFr and hBA1c in 4 month

## 2012-05-08 DIAGNOSIS — N390 Urinary tract infection, site not specified: Secondary | ICD-10-CM | POA: Insufficient documentation

## 2012-05-08 LAB — URINE CULTURE

## 2012-05-09 MED ORDER — CIPROFLOXACIN HCL 500 MG PO TABS: 500.0000 mg | ORAL_TABLET | Freq: Two times a day (BID) | ORAL | Status: DC

## 2012-05-12 ENCOUNTER — Other Ambulatory Visit (HOSPITAL_COMMUNITY): Payer: Medicare Other

## 2012-05-19 ENCOUNTER — Ambulatory Visit (HOSPITAL_COMMUNITY)
Admission: RE | Admit: 2012-05-19 | Discharge: 2012-05-19 | Disposition: A | Discharge status: Home / Self Care | Payer: Medicare Other | Source: Ambulatory Visit | Attending: Family Medicine | Admitting: Family Medicine

## 2012-05-19 DIAGNOSIS — Q782 Osteopetrosis: Secondary | ICD-10-CM

## 2012-05-19 DIAGNOSIS — Z780 Asymptomatic menopausal state: Secondary | ICD-10-CM | POA: Insufficient documentation

## 2012-05-19 DIAGNOSIS — M899 Disorder of bone, unspecified: Secondary | ICD-10-CM | POA: Insufficient documentation

## 2012-05-20 ENCOUNTER — Other Ambulatory Visit: Payer: Self-pay | Admitting: Family Medicine

## 2012-05-22 ENCOUNTER — Other Ambulatory Visit: Payer: Self-pay | Admitting: Family Medicine

## 2012-05-23 ENCOUNTER — Other Ambulatory Visit: Payer: Self-pay

## 2012-05-23 MED ORDER — ALENDRONATE SODIUM 70 MG PO TABS: 70.0000 mg | ORAL_TABLET | ORAL | Status: DC

## 2012-05-25 ENCOUNTER — Other Ambulatory Visit: Payer: Self-pay | Admitting: Family Medicine

## 2012-05-29 ENCOUNTER — Telehealth: Payer: Self-pay | Admitting: Family Medicine

## 2012-05-30 ENCOUNTER — Other Ambulatory Visit: Payer: Self-pay

## 2012-05-30 ENCOUNTER — Other Ambulatory Visit: Payer: Self-pay | Admitting: Family Medicine

## 2012-05-30 MED ORDER — HYDROCODONE-ACETAMINOPHEN 10-325 MG PO TABS: ORAL_TABLET | ORAL | Status: DC

## 2012-05-30 NOTE — Telephone Encounter (Signed)
She has not gotten hydrocodone from [facility] since 12/05/2011 and it was the 10/325mg  qid. Her last rx was from you and it was hydrocodone 10/325 1 tab qid picked up on 04/29/2012 which would make it due today.

## 2012-05-30 NOTE — Telephone Encounter (Signed)
pls refill x 3 and let the pt know, thanks you very much!

## 2012-05-30 NOTE — Telephone Encounter (Signed)
rx printed to be faxed

## 2012-05-30 NOTE — Telephone Encounter (Signed)
States morning sugars are between 130 and 160, has been advised to increase glyburide metformin to one twice daily  Pt states she  will no longer see Dr Eduard Clos, requests hydrocodone 4 times daily be sent to [facility], reports due today, pls look into this 10/325, and let me know past prescribing details

## 2012-06-01 NOTE — Assessment & Plan Note (Signed)
Controlled, no change in medication  

## 2012-06-01 NOTE — Assessment & Plan Note (Signed)
Controlled, updated lab in 3 month Hyperlipidemia:Low fat diet discussed and encouraged.

## 2012-06-01 NOTE — Assessment & Plan Note (Signed)
Over corrected with episodes of relative hypoglycemia, discontinue lantus, call back if blood sugars are elevated

## 2012-06-01 NOTE — Assessment & Plan Note (Signed)
Controlled, no change in medication DASH diet and commitment to daily physical activity for a minimum of 30 minutes discussed and encouraged, as a part of hypertension management. The importance of attaining a healthy weight is also discussed.  

## 2012-06-01 NOTE — Assessment & Plan Note (Signed)
Abnormal UA and symptomatic, treat presumptively and f/u on c/s

## 2012-06-11 ENCOUNTER — Other Ambulatory Visit: Payer: Self-pay | Admitting: Family Medicine

## 2012-07-11 ENCOUNTER — Telehealth: Payer: Self-pay | Admitting: Family Medicine

## 2012-07-20 NOTE — Telephone Encounter (Signed)
Please advise 

## 2012-07-21 ENCOUNTER — Telehealth: Payer: Self-pay | Admitting: Family Medicine

## 2012-07-21 NOTE — Telephone Encounter (Signed)
These ranges are acceptable, needs to remain off the insulin spoke with the pt directly on the morning message was seen 10 days after original call, I apologized for tardy response and advised that she call back if no response to such an important was not answered within 2 days

## 2012-07-21 NOTE — Telephone Encounter (Signed)
Yes, pls send for the note from the podiatrist so in pt's record for reference, since I do not prescribe this , thanks

## 2012-07-22 NOTE — Telephone Encounter (Signed)
Spoke with [employer]. They will send over copy of original rx that Dr. Ulice Brilliant wrote for brace.

## 2012-07-22 NOTE — Telephone Encounter (Signed)
Sent for note  

## 2012-07-22 NOTE — Telephone Encounter (Signed)
Script written pls fax

## 2012-07-22 NOTE — Telephone Encounter (Signed)
Noted faxed

## 2012-07-28 ENCOUNTER — Other Ambulatory Visit: Payer: Self-pay | Admitting: Family Medicine

## 2012-07-31 ENCOUNTER — Other Ambulatory Visit: Payer: Self-pay | Admitting: Family Medicine

## 2012-08-25 ENCOUNTER — Telehealth: Payer: Self-pay | Admitting: Family Medicine

## 2012-08-25 NOTE — Telephone Encounter (Signed)
pls have pt come in tomorrow afternoon for ed follow up on the infected foot, psl cakll and give her an appt for the pm

## 2012-08-26 ENCOUNTER — Encounter: Payer: Self-pay | Admitting: Family Medicine

## 2012-08-26 ENCOUNTER — Ambulatory Visit (INDEPENDENT_AMBULATORY_CARE_PROVIDER_SITE_OTHER): Payer: Medicare Other | Admitting: Family Medicine

## 2012-08-26 VITALS — BP 144/80 | HR 71 | Resp 16 | Wt 172.4 lb

## 2012-08-26 DIAGNOSIS — F329 Major depressive disorder, single episode, unspecified: Secondary | ICD-10-CM

## 2012-08-26 DIAGNOSIS — N058 Unspecified nephritic syndrome with other morphologic changes: Secondary | ICD-10-CM

## 2012-08-26 DIAGNOSIS — L02419 Cutaneous abscess of limb, unspecified: Secondary | ICD-10-CM

## 2012-08-26 DIAGNOSIS — F3289 Other specified depressive episodes: Secondary | ICD-10-CM

## 2012-08-26 DIAGNOSIS — E785 Hyperlipidemia, unspecified: Secondary | ICD-10-CM

## 2012-08-26 DIAGNOSIS — E1129 Type 2 diabetes mellitus with other diabetic kidney complication: Secondary | ICD-10-CM

## 2012-08-26 DIAGNOSIS — E1121 Type 2 diabetes mellitus with diabetic nephropathy: Secondary | ICD-10-CM

## 2012-08-26 DIAGNOSIS — L03119 Cellulitis of unspecified part of limb: Secondary | ICD-10-CM | POA: Insufficient documentation

## 2012-08-26 DIAGNOSIS — IMO0001 Reserved for inherently not codable concepts without codable children: Secondary | ICD-10-CM

## 2012-08-26 DIAGNOSIS — L089 Local infection of the skin and subcutaneous tissue, unspecified: Secondary | ICD-10-CM | POA: Insufficient documentation

## 2012-08-26 DIAGNOSIS — I10 Essential (primary) hypertension: Secondary | ICD-10-CM

## 2012-08-26 DIAGNOSIS — L03115 Cellulitis of right lower limb: Secondary | ICD-10-CM | POA: Insufficient documentation

## 2012-08-26 DIAGNOSIS — IMO0002 Reserved for concepts with insufficient information to code with codable children: Secondary | ICD-10-CM

## 2012-08-26 DIAGNOSIS — R32 Unspecified urinary incontinence: Secondary | ICD-10-CM

## 2012-08-26 DIAGNOSIS — S81009A Unspecified open wound, unspecified knee, initial encounter: Secondary | ICD-10-CM

## 2012-08-26 LAB — HEMOGLOBIN A1C: Hgb A1c MFr Bld: 7.6 % — ABNORMAL HIGH (ref <5.7)

## 2012-08-26 MED ORDER — HYDROCODONE-ACETAMINOPHEN 10-325 MG PO TABS: ORAL_TABLET | ORAL | Status: DC

## 2012-08-26 MED ORDER — OMEPRAZOLE 20 MG PO CPDR: 20.0000 mg | DELAYED_RELEASE_CAPSULE | Freq: Every day | ORAL | Status: DC

## 2012-08-26 MED ORDER — DILTIAZEM HCL ER 240 MG PO CP24: ORAL_CAPSULE | ORAL | Status: DC

## 2012-08-26 MED ORDER — FESOTERODINE FUMARATE ER 4 MG PO TB24: 4.0000 mg | ORAL_TABLET | Freq: Every day | ORAL | Status: DC

## 2012-08-26 MED ORDER — BENAZEPRIL-HYDROCHLOROTHIAZIDE 20-12.5 MG PO TABS: 2.0000 | ORAL_TABLET | Freq: Every day | ORAL | Status: DC

## 2012-08-26 NOTE — Assessment & Plan Note (Signed)
Controlled, no change in medication  

## 2012-08-26 NOTE — Progress Notes (Signed)
  Subjective:    Patient ID: [name] [name], female    DOB: 11/29/1929, 79 y.o.   MRN: [id number]  HPI The PT is here for follow up and re-evaluation of chronic medical conditions, medication management and review of any available recent lab and radiology data HBA1C is past due, states over the weekend her fasting sugars got as hih as over 200, and this morning her FBG, was down to 113. She has her log which I will review. i explained  that blood sugar elevated due to infection. Called in for Ed follow up  due to cat bite las week Thursday, states she has done this before.Watched the area from Thursday to Saturday, she experienced increased pain , redness and tenderness , no drainage from the area and no fever. Marked improvement with antibiotics, but still having tenderness, reduced area of redness. She was given IV and oral antibiotic in the Ed and is now  on 2 antibiotics,cleocin and augmentin and is  here for f/u. Labs at the Ed showed no leukocytosis Preventive health is updated, specifically  Cancer screening and Immunization.Needs eye exam   Questions or concerns regarding consultations or procedures which the PT has had in the interim are  addressed. The PT denies any adverse reactions to current medications since the last visit.  Also c/o fall with trauma to buttock appiox 1 month ago, since then increased back pain with stinging sensation in lower extremities, will refer for epidural which she has benefited from in the past . Still ambulates with a walker , but really needs a pWC for use in the home due to repeated and multiple fal;ls in the home, states insufficient space in the home       Review of Systems See HPI Denies recent fever or chills. Denies sinus pressure, nasal congestion, ear pain or sore throat. Denies chest congestion, productive cough or wheezing. Denies chest pains, palpitations and leg swelling Denies abdominal pain, nausea, vomiting,diarrhea or constipation.   Denies  dysuria, frequency, hesitancy or incontinence. Reduced mobility with multiple falls most recent end July , landed on buttocks. States her home cannot accomodate a PWC. States she felt a "zing" with the fall. Experiencing more difficulty walking states she has  more pain across her low back , and her right leg and foot tingle, she has foot drop Denies headaches, seizures, c/o tingling iin fingers and  Contractures in the am Denies ucontrolled  depression, anxiety or insomnia. Denies skin break down or rash.          Objective:   Physical Exam  Patient alert and oriented and in no cardiopulmonary distress.  HEENT: No facial asymmetry, EOMI, no sinus tenderness,  oropharynx pink and moist.  Neck decreased though adequate ROM,no adenopathy.  Chest: Clear to auscultation bilaterally.  CVS: S1, S2 no murmurs, no S3.  ABD: Soft non tender. Bowel sounds normal.  Ext: No edema  MS: Decreased  ROM spine, shoulders, hips and knees.  Skin: erythema on right leg, max diameter approx 3 inches, mildly warm, puncture site healing , no drainage  Psych: Good eye contact, normal affect. Memory intact not anxious or depressed appearing.  CNS: CN 2-12 intact, power, tone and sensation normal throughout.       Assessment & Plan:

## 2012-08-26 NOTE — Assessment & Plan Note (Signed)
Increased and uncontrolled following fall 1 month ago , refer for epidural

## 2012-08-26 NOTE — Assessment & Plan Note (Signed)
Improving with antibiotics, due to recent cat bite

## 2012-08-26 NOTE — Patient Instructions (Addendum)
F/u in early December, please call if you need me before  Please call in October for your flu vaccine   Complete entire course of antibiotics please, the infection at the bite is improving from what I understand   PLEASE see if you can fit a power wheelchair in your home, your balance and mobility are a big challenge and you fall often   HBA1C today, we will send for labs from [facility]  Please schedule your eye exam this is past due  You are referred to [facility] imaging for epidural due to increased low back pain following fall  Vesicare is discontinued since not effective , new is toviaz  Fasting lipid, cmp and EGFr and hBA1c 3 to 5 days before Dec visit

## 2012-08-26 NOTE — Assessment & Plan Note (Addendum)
Adequate control though not as good as previously Patient advised to reduce carb and sweets,  take meds as prescribed, test blood as directed, and attempt to lose weight, to improve blood sugar control.

## 2012-08-26 NOTE — Assessment & Plan Note (Signed)
Marked improveemnt since antibiotics , based on pt history and current exam, she is to complete entire course

## 2012-08-26 NOTE — Assessment & Plan Note (Addendum)
No response to vesicare was initally helpful but no more and this is a problem, trial of toviaz, script sent, may need PA but has failed vesicare

## 2012-08-27 ENCOUNTER — Other Ambulatory Visit: Payer: Self-pay

## 2012-08-27 ENCOUNTER — Other Ambulatory Visit: Payer: Self-pay | Admitting: Family Medicine

## 2012-08-27 DIAGNOSIS — R32 Unspecified urinary incontinence: Secondary | ICD-10-CM

## 2012-08-27 DIAGNOSIS — M545 Low back pain, unspecified: Secondary | ICD-10-CM

## 2012-08-27 MED ORDER — FESOTERODINE FUMARATE ER 4 MG PO TB24: 4.0000 mg | ORAL_TABLET | Freq: Every day | ORAL | Status: DC

## 2012-09-04 ENCOUNTER — Ambulatory Visit
Admission: RE | Admit: 2012-09-04 | Discharge: 2012-09-04 | Disposition: A | Discharge status: Home / Self Care | Payer: Medicare Other | Source: Ambulatory Visit | Attending: Family Medicine | Admitting: Family Medicine

## 2012-09-04 DIAGNOSIS — M545 Low back pain, unspecified: Secondary | ICD-10-CM

## 2012-09-04 MED ORDER — METHYLPREDNISOLONE ACETATE 40 MG/ML INJ SUSP (RADIOLOG
120.0000 mg | Freq: Once | INTRAMUSCULAR | Status: AC
  Administered 2012-09-04: 120 mg via EPIDURAL

## 2012-09-04 MED ORDER — IOHEXOL 180 MG/ML  SOLN
1.0000 mL | Freq: Once | INTRAMUSCULAR | Status: AC | PRN
  Administered 2012-09-04: 1 mL via EPIDURAL

## 2012-09-11 ENCOUNTER — Ambulatory Visit: Payer: Medicare Other | Admitting: Family Medicine

## 2012-09-29 ENCOUNTER — Telehealth: Payer: Self-pay | Admitting: Family Medicine

## 2012-09-29 NOTE — Telephone Encounter (Signed)
Patient aware it was faxed back

## 2012-10-09 ENCOUNTER — Other Ambulatory Visit: Payer: Self-pay

## 2012-10-09 MED ORDER — HYDROCODONE-ACETAMINOPHEN 10-325 MG PO TABS: ORAL_TABLET | ORAL | Status: DC

## 2012-10-20 ENCOUNTER — Telehealth: Payer: Self-pay | Admitting: Family Medicine

## 2012-10-22 ENCOUNTER — Other Ambulatory Visit: Payer: Self-pay | Admitting: Family Medicine

## 2012-10-22 DIAGNOSIS — R32 Unspecified urinary incontinence: Secondary | ICD-10-CM

## 2012-10-22 NOTE — Telephone Encounter (Signed)
Advise will refer to urology in [place]

## 2012-10-22 NOTE — Telephone Encounter (Signed)
Please advise 

## 2012-10-22 NOTE — Telephone Encounter (Signed)
Patient aware.

## 2012-10-29 ENCOUNTER — Other Ambulatory Visit: Payer: Self-pay | Admitting: Family Medicine

## 2012-11-03 ENCOUNTER — Other Ambulatory Visit: Payer: Self-pay

## 2012-11-03 MED ORDER — HYDROCODONE-ACETAMINOPHEN 10-325 MG PO TABS: ORAL_TABLET | ORAL | Status: DC

## 2012-11-07 ENCOUNTER — Telehealth: Payer: Self-pay | Admitting: Family Medicine

## 2012-11-07 ENCOUNTER — Telehealth: Payer: Self-pay

## 2012-11-07 NOTE — Telephone Encounter (Signed)
Patient called back to report that her blood sugar is now 308.  She was advised to continue meds as prescribed and to watch diet.   She was also advised that if blood sugar continues to climb to be seen at the urgent care or emergency room.

## 2012-11-07 NOTE — Telephone Encounter (Signed)
If this has been a pattern of elevated blood sugars for several days , will need to take an extra half glucovance if able to break or an additional 1 tablet daily. (curent is 2/day) If no change in diet blood sugar should not just suddenly be elevated  check her for symptoms of infection, urine or respiratory when she calls back  Ok to send msg later if/when she calls back

## 2012-11-10 ENCOUNTER — Ambulatory Visit (INDEPENDENT_AMBULATORY_CARE_PROVIDER_SITE_OTHER): Payer: Medicare Other

## 2012-11-10 ENCOUNTER — Encounter (INDEPENDENT_AMBULATORY_CARE_PROVIDER_SITE_OTHER): Payer: Self-pay

## 2012-11-10 DIAGNOSIS — Z23 Encounter for immunization: Secondary | ICD-10-CM

## 2012-11-10 NOTE — Telephone Encounter (Signed)
Patient aware.  States that blood sugars normalized over the weekend.

## 2012-11-18 ENCOUNTER — Ambulatory Visit (INDEPENDENT_AMBULATORY_CARE_PROVIDER_SITE_OTHER): Payer: Medicare Other | Admitting: Urology

## 2012-11-18 DIAGNOSIS — R351 Nocturia: Secondary | ICD-10-CM

## 2012-11-18 DIAGNOSIS — R32 Unspecified urinary incontinence: Secondary | ICD-10-CM

## 2012-11-18 DIAGNOSIS — N318 Other neuromuscular dysfunction of bladder: Secondary | ICD-10-CM

## 2012-11-18 DIAGNOSIS — R3915 Urgency of urination: Secondary | ICD-10-CM

## 2012-12-03 ENCOUNTER — Other Ambulatory Visit: Payer: Self-pay

## 2012-12-03 MED ORDER — HYDROCODONE-ACETAMINOPHEN 10-325 MG PO TABS: ORAL_TABLET | ORAL | Status: DC

## 2012-12-05 LAB — COMPLETE METABOLIC PANEL WITH GFR
ALT: 17 U/L (ref 0–35)
AST: 25 U/L (ref 0–37)
BUN: 29 mg/dL — ABNORMAL HIGH (ref 6–23)
Creat: 1.02 mg/dL (ref 0.50–1.10)
GFR, Est African American: 60 mL/min
Total Bilirubin: 0.5 mg/dL (ref 0.3–1.2)

## 2012-12-05 LAB — LIPID PANEL
Cholesterol: 180 mg/dL (ref 0–200)
HDL: 68 mg/dL (ref >39)
LDL Cholesterol: 70 mg/dL (ref 0–99)
Triglycerides: 209 mg/dL — ABNORMAL HIGH (ref <150)
VLDL: 42 mg/dL — ABNORMAL HIGH (ref 0–40)

## 2012-12-05 LAB — HEMOGLOBIN A1C
Hgb A1c MFr Bld: 7.7 % — ABNORMAL HIGH (ref <5.7)
Mean Plasma Glucose: 174 mg/dL — ABNORMAL HIGH (ref <117)

## 2012-12-10 ENCOUNTER — Encounter: Payer: Self-pay | Admitting: Family Medicine

## 2012-12-10 ENCOUNTER — Ambulatory Visit (INDEPENDENT_AMBULATORY_CARE_PROVIDER_SITE_OTHER): Payer: Medicare Other | Admitting: Family Medicine

## 2012-12-10 ENCOUNTER — Encounter (INDEPENDENT_AMBULATORY_CARE_PROVIDER_SITE_OTHER): Payer: Self-pay

## 2012-12-10 VITALS — BP 140/70 | HR 66 | Resp 16 | Ht 62.0 in | Wt 175.2 lb

## 2012-12-10 DIAGNOSIS — Z9181 History of falling: Secondary | ICD-10-CM

## 2012-12-10 DIAGNOSIS — E1121 Type 2 diabetes mellitus with diabetic nephropathy: Secondary | ICD-10-CM

## 2012-12-10 DIAGNOSIS — E119 Type 2 diabetes mellitus without complications: Secondary | ICD-10-CM

## 2012-12-10 DIAGNOSIS — R32 Unspecified urinary incontinence: Secondary | ICD-10-CM

## 2012-12-10 DIAGNOSIS — M899 Disorder of bone, unspecified: Secondary | ICD-10-CM

## 2012-12-10 DIAGNOSIS — F329 Major depressive disorder, single episode, unspecified: Secondary | ICD-10-CM

## 2012-12-10 DIAGNOSIS — N058 Unspecified nephritic syndrome with other morphologic changes: Secondary | ICD-10-CM

## 2012-12-10 DIAGNOSIS — I10 Essential (primary) hypertension: Secondary | ICD-10-CM

## 2012-12-10 DIAGNOSIS — IMO0002 Reserved for concepts with insufficient information to code with codable children: Secondary | ICD-10-CM

## 2012-12-10 DIAGNOSIS — C4490 Unspecified malignant neoplasm of skin, unspecified: Secondary | ICD-10-CM

## 2012-12-10 DIAGNOSIS — E785 Hyperlipidemia, unspecified: Secondary | ICD-10-CM

## 2012-12-10 DIAGNOSIS — R296 Repeated falls: Secondary | ICD-10-CM

## 2012-12-10 DIAGNOSIS — E1129 Type 2 diabetes mellitus with other diabetic kidney complication: Secondary | ICD-10-CM

## 2012-12-10 MED ORDER — FENOFIBRATE 145 MG PO TABS: ORAL_TABLET | ORAL | Status: DC

## 2012-12-10 MED ORDER — FESOTERODINE FUMARATE ER 8 MG PO TB24: 8.0000 mg | ORAL_TABLET | Freq: Every day | ORAL | Status: DC

## 2012-12-10 MED ORDER — DILTIAZEM HCL ER 240 MG PO CP24: ORAL_CAPSULE | ORAL | Status: DC

## 2012-12-10 MED ORDER — CITALOPRAM HYDROBROMIDE 40 MG PO TABS: ORAL_TABLET | ORAL | Status: DC

## 2012-12-10 MED ORDER — BENAZEPRIL-HYDROCHLOROTHIAZIDE 20-12.5 MG PO TABS: 2.0000 | ORAL_TABLET | Freq: Every day | ORAL | Status: DC

## 2012-12-10 NOTE — Patient Instructions (Addendum)
Pelvic and breast exam in 4 month, call if you need me before please.  Blood sugar control is still fairly  Good, but you do need to reduce intake of candy  Please cut back on cheese, your Triglycerides are high  Please be careful not to fall anymore  Remember that therapy is available to you for counseling for depression  You are referred for skin exam due to areas of concern

## 2012-12-10 NOTE — Progress Notes (Signed)
   Subjective:    Patient ID: [name] [name], female    DOB: 12-Sep-1929, 79 y.o.   MRN: [id number]  HPI The PT is here for follow up and re-evaluation of chronic medical conditions, medication management and review of any available recent lab and radiology data.  Preventive health is updated, specifically  Cancer screening and Immunization.   Questions or concerns regarding consultations or procedures which the PT has had in the interim are  addressed. The PT denies any adverse reactions to current medications since the last visit.  States she fell 11/24 over wrinklles in her carpet, currently refinancing to try to get the funds to replace carpet.Trauma to nose and nose, then last week she bent over  And was trying to pull out some old tele directories she collapsed to the  Floor , no trauma to rest of body C/oi new skin lesion on leg which itches, and other areas of concern, past h/o skin cancer , will refer to derm Increased sugar intake in recent times with elevated blood sugars, denies blurred vision , polyuria or polydipsia, will work to change eating habits after the holidays    Review of Systems See HPI Denies recent fever or chills. Denies sinus pressure, nasal congestion, ear pain or sore throat. Denies chest congestion, productive cough or wheezing. Denies chest pains, palpitations and leg swelling Denies abdominal pain, nausea, vomiting,diarrhea or constipation.   Denies dysuria, frequency, hesitancy or incontinence. Chronic back pian with lower extremity weakness, instability and multiple fall history Denies headaches, seizures, numbness, or tingling. Denies uncontrolled  depression, anxiety or insomnia.       Objective:   Physical Exam  Patient alert and oriented and in no cardiopulmonary distress.  HEENT: No facial asymmetry, EOMI, no sinus tenderness,  oropharynx pink and moist.  Neck supple no adenopathy.  Chest: Clear to auscultation bilaterally.  CVS: S1, S2 no  murmurs, no S3.  ABD: Soft non tender. Bowel sounds normal.  Ext: No edema  [id number]  ROM spine, adequate in , hips and knees.  Skin: Intact, no ulcerations or rash noted.  Psych: Good eye contact, normal affect. Memory intact not anxious or depressed appearing.  CNS: CN 2-12 intact, power normal throughout.       Assessment & Plan:

## 2012-12-11 ENCOUNTER — Other Ambulatory Visit: Payer: Self-pay

## 2012-12-11 DIAGNOSIS — E119 Type 2 diabetes mellitus without complications: Secondary | ICD-10-CM

## 2012-12-11 MED ORDER — GLYBURIDE-METFORMIN 5-500 MG PO TABS: 1.0000 | ORAL_TABLET | Freq: Two times a day (BID) | ORAL | Status: DC

## 2012-12-11 NOTE — Telephone Encounter (Signed)
Patient was seen 12.10.2014

## 2012-12-14 NOTE — Assessment & Plan Note (Signed)
Concerned about new skin lesions on arm and left leg referral entered for derm eval

## 2012-12-14 NOTE — Assessment & Plan Note (Signed)
Needs to improve, reports excessive sugar intake and will address this Patient advised to reduce carb and sweets, commit to regular physical activity, take meds as prescribed, test blood as directed, and attempt to lose weight, to improve blood sugar control.

## 2012-12-14 NOTE — Assessment & Plan Note (Signed)
Home safety issues discussed, most recent fall was due to bad carpet, pt trying to replace asap

## 2012-12-14 NOTE — Assessment & Plan Note (Signed)
unchnaged , chronic pain meds as before

## 2012-12-14 NOTE — Assessment & Plan Note (Signed)
Fair control, but would benefit from therapy to help deal with ongoing family stress, pt to consider this. Not suicidal or homicidal, just a lot going on with her grand daughter inparticular

## 2012-12-14 NOTE — Assessment & Plan Note (Signed)
adeqiate though sub optimal control. DASH diet and commitment to daily physical activity for a minimum of 30 minutes discussed and encouraged, as a part of hypertension management. The importance of attaining a healthy weight is also discussed. No med change

## 2012-12-14 NOTE — Assessment & Plan Note (Signed)
Improved with medication 

## 2012-12-14 NOTE — Assessment & Plan Note (Signed)
Uncontrolled with elevated TG due to dietary indiscretion Hyperlipidemia:Low fat diet discussed and encouraged.  No med change

## 2012-12-16 ENCOUNTER — Telehealth: Payer: Self-pay

## 2012-12-16 ENCOUNTER — Other Ambulatory Visit: Payer: Self-pay | Admitting: Family Medicine

## 2012-12-16 DIAGNOSIS — M549 Dorsalgia, unspecified: Secondary | ICD-10-CM

## 2012-12-16 NOTE — Telephone Encounter (Signed)
pls refer to facility in [place],  Or wherever she has been before for epidural , lumbar, referral entered, and let her know

## 2012-12-18 ENCOUNTER — Ambulatory Visit
Admission: RE | Admit: 2012-12-18 | Discharge: 2012-12-18 | Disposition: A | Discharge status: Home / Self Care | Payer: Medicare Other | Source: Ambulatory Visit | Attending: Family Medicine | Admitting: Family Medicine

## 2012-12-18 DIAGNOSIS — M549 Dorsalgia, unspecified: Secondary | ICD-10-CM

## 2012-12-18 MED ORDER — METHYLPREDNISOLONE ACETATE 40 MG/ML INJ SUSP (RADIOLOG
120.0000 mg | Freq: Once | INTRAMUSCULAR | Status: AC
  Administered 2012-12-18: 120 mg via EPIDURAL

## 2012-12-18 MED ORDER — IOHEXOL 180 MG/ML  SOLN
1.0000 mL | Freq: Once | INTRAMUSCULAR | Status: AC | PRN
  Administered 2012-12-18: 1 mL via EPIDURAL

## 2012-12-19 ENCOUNTER — Other Ambulatory Visit: Payer: Self-pay | Admitting: Family Medicine

## 2012-12-19 MED ORDER — METFORMIN HCL ER (OSM) 1000 MG PO TB24: 1000.0000 mg | ORAL_TABLET | Freq: Every day | ORAL | Status: DC

## 2012-12-19 MED ORDER — GLIPIZIDE ER 10 MG PO TB24: 10.0000 mg | ORAL_TABLET | Freq: Every day | ORAL | Status: DC

## 2012-12-23 ENCOUNTER — Ambulatory Visit: Payer: Medicare Other | Admitting: Urology

## 2012-12-29 ENCOUNTER — Other Ambulatory Visit: Payer: Self-pay

## 2012-12-29 ENCOUNTER — Telehealth: Payer: Self-pay | Admitting: Family Medicine

## 2012-12-29 MED ORDER — GLIPIZIDE ER 5 MG PO TB24: 5.0000 mg | ORAL_TABLET | Freq: Every day | ORAL | Status: DC

## 2012-12-29 NOTE — Telephone Encounter (Signed)
Advise addition of glipizide 5mg  one daily, take in the mornings along with tthe 10 mg tablet, pls send to requested pharmacy, I suggest 30 day supply initially, will enter the med, pls send after you spk with her He TOTAL dose therefore is 15 mg everyt am. Ensure no symptoms of any infection present and that she is being more careful with her diet pls also

## 2012-12-29 NOTE — Telephone Encounter (Signed)
Still having ups and downs of her blood sugar since epidural on the 18th. Yesterday it was 213 fasting. Day before yesterday was 197 and after lunch it was 177. This am it was 187 fasting. Is thinking it should start to go down but it hasn't yet. Is taking meds as prescribed. Please advise

## 2012-12-29 NOTE — Telephone Encounter (Signed)
Patient aware and will collect rx. No other symptoms present. Will also be more careful with diet

## 2012-12-29 NOTE — Telephone Encounter (Signed)
Spoke with patient who stated that she recently had steroid injection.  Advised her that this is normal and she should call back if her fasting level is still elevated after a week.

## 2013-01-06 ENCOUNTER — Telehealth: Payer: Self-pay

## 2013-01-06 NOTE — Telephone Encounter (Signed)
Increase glipizide to 10mg  twice daily for next week, sched an appt for 10 to 14 days  Re uncontrolled diabetes, bring in meter etc, she will need to add lantus if blood sugar remains elevated She can use the glipizide she has now ensure total dose is 20mg  daily, has been on 15mg 

## 2013-01-06 NOTE — Telephone Encounter (Signed)
Patient aware of advise and will take Glipizide ER 20mg  once daily x 1 week  per verbal from Dr. Tula Nakayama.  She is to come in on 1/22 and is aware.  She will call if she has any problems before appt.

## 2013-01-06 NOTE — Telephone Encounter (Signed)
Patient states even after adding the additional glipizide in the am her sugars are still running high. They are especially high in the am fasting- usually up in the 240's. During the afternoon it goes down in the 160's. Please advise what she needs to do

## 2013-01-07 ENCOUNTER — Telehealth: Payer: Self-pay | Admitting: Family Medicine

## 2013-01-09 ENCOUNTER — Other Ambulatory Visit: Payer: Self-pay

## 2013-01-09 ENCOUNTER — Telehealth: Payer: Self-pay | Admitting: Family Medicine

## 2013-01-09 MED ORDER — HYDROCODONE-ACETAMINOPHEN 10-325 MG PO TABS
ORAL_TABLET | ORAL | Status: DC
Start: 2013-01-09 — End: 2013-02-04

## 2013-01-13 ENCOUNTER — Ambulatory Visit (INDEPENDENT_AMBULATORY_CARE_PROVIDER_SITE_OTHER): Payer: Medicare HMO | Admitting: Urology

## 2013-01-13 DIAGNOSIS — R351 Nocturia: Secondary | ICD-10-CM

## 2013-01-13 DIAGNOSIS — R32 Unspecified urinary incontinence: Secondary | ICD-10-CM

## 2013-01-13 DIAGNOSIS — R3915 Urgency of urination: Secondary | ICD-10-CM

## 2013-01-13 DIAGNOSIS — N318 Other neuromuscular dysfunction of bladder: Secondary | ICD-10-CM

## 2013-01-14 ENCOUNTER — Telehealth: Payer: Self-pay

## 2013-01-14 ENCOUNTER — Other Ambulatory Visit: Payer: Self-pay

## 2013-01-14 MED ORDER — GLUCOSE BLOOD VI STRP: ORAL_STRIP | Status: DC

## 2013-01-14 NOTE — Telephone Encounter (Signed)
Faxed over Auth to urology

## 2013-01-14 NOTE — Telephone Encounter (Signed)
Strips sent as requested

## 2013-01-15 ENCOUNTER — Other Ambulatory Visit: Payer: Self-pay | Admitting: Family Medicine

## 2013-01-15 DIAGNOSIS — Z139 Encounter for screening, unspecified: Secondary | ICD-10-CM

## 2013-01-19 ENCOUNTER — Telehealth: Payer: Self-pay

## 2013-01-19 ENCOUNTER — Other Ambulatory Visit: Payer: Self-pay

## 2013-01-19 MED ORDER — GLUCOSE BLOOD VI STRP: ORAL_STRIP | Status: DC

## 2013-01-19 MED ORDER — INSULIN GLARGINE 100 UNIT/ML SOLOSTAR PEN: 25.0000 [IU] | PEN_INJECTOR | Freq: Every day | SUBCUTANEOUS | Status: DC

## 2013-01-19 NOTE — Telephone Encounter (Signed)
noted 

## 2013-01-19 NOTE — Telephone Encounter (Signed)
She will need to add long acting insulin, she can start lantus solostar 10 uniots daily but send in script for 25 units daily. If she prefers since coming in in 3 days can wait to start then, but since she is calling and her sugar is high I think she prefers now Test increases to 3 times daily on insullin

## 2013-01-19 NOTE — Telephone Encounter (Signed)
Patient aware and med sent and testing strips. Patient has enough needles

## 2013-01-19 NOTE — Telephone Encounter (Signed)
Called and spoke with patient.  She states that change started after Glyburide was changed.  No change in diet.  Please advise.  She is taking all meds as prescribed.

## 2013-01-22 ENCOUNTER — Encounter (INDEPENDENT_AMBULATORY_CARE_PROVIDER_SITE_OTHER): Payer: Self-pay

## 2013-01-22 ENCOUNTER — Encounter: Payer: Self-pay | Admitting: Family Medicine

## 2013-01-22 ENCOUNTER — Ambulatory Visit (INDEPENDENT_AMBULATORY_CARE_PROVIDER_SITE_OTHER): Payer: Medicare HMO | Admitting: Family Medicine

## 2013-01-22 VITALS — BP 148/80 | HR 79 | Resp 16 | Ht 62.0 in | Wt 170.8 lb

## 2013-01-22 DIAGNOSIS — R5381 Other malaise: Secondary | ICD-10-CM

## 2013-01-22 DIAGNOSIS — I10 Essential (primary) hypertension: Secondary | ICD-10-CM

## 2013-01-22 DIAGNOSIS — E1121 Type 2 diabetes mellitus with diabetic nephropathy: Secondary | ICD-10-CM

## 2013-01-22 DIAGNOSIS — R5383 Other fatigue: Secondary | ICD-10-CM

## 2013-01-22 DIAGNOSIS — E785 Hyperlipidemia, unspecified: Secondary | ICD-10-CM

## 2013-01-22 DIAGNOSIS — IMO0002 Reserved for concepts with insufficient information to code with codable children: Secondary | ICD-10-CM

## 2013-01-22 DIAGNOSIS — E1129 Type 2 diabetes mellitus with other diabetic kidney complication: Secondary | ICD-10-CM

## 2013-01-22 DIAGNOSIS — N058 Unspecified nephritic syndrome with other morphologic changes: Secondary | ICD-10-CM

## 2013-01-22 MED ORDER — OMEPRAZOLE 20 MG PO CPDR: 20.0000 mg | DELAYED_RELEASE_CAPSULE | Freq: Every day | ORAL | Status: DC

## 2013-01-22 NOTE — Patient Instructions (Addendum)
F/u 2nd week in March, call if you need me before   Goal for fasting blood sugar is 110 to 140, if morning  blood sugar stays above 140 , on Saturday, increase the lantus to 13 units You are referred to podiatrist  Non fasting chem 7 and EGFr, hBA1C, microalb and CBC and TSH in march before f/u

## 2013-01-22 NOTE — Progress Notes (Signed)
   Subjective:    Patient ID: [name] [name], female    DOB: 1929/02/03, 80 y.o.   MRN: [id number]  HPI Pt in for review of uncontrolled diabetes. States blood sugars have markedly improved since starting insulin once more, she denies polyuria, polydipsia , blurred vision or hypoglycemic episodes. Fasting blood sugars are seldom over 140. Generally feels better with her blood sugar improved, they had become very uncontrolled after epidural injections last Fall   Review of Systems See HPI Denies recent fever or chills. Denies sinus pressure, nasal congestion, ear pain or sore throat. Denies chest congestion, productive cough or wheezing. Denies chest pains, palpitations and leg swelling  Chronic  joint pain, swelling and limitation in mobility. Denies headaches, seizures, numbness, or tingling. Denies depression, anxiety or insomnia. Denies skin break down or rash.        Objective:   Physical Exam  BP 148/80  Pulse 79  Resp 16  Ht 5\' 2"  (1.575 m)  Wt 170 lb 12.8 oz (77.474 kg)  BMI 31.23 kg/m2  SpO2 97% Patient alert and oriented and in no cardiopulmonary distress.  HEENT: No facial asymmetry, EOMI, no sinus tenderness,  oropharynx pink and moist.  Neck decreased though adequate ROM, no adenopathy.No JVD  Chest: Clear to auscultation bilaterally.  CVS: S1, S2 no murmurs, no S3.  ABD: Soft non tender. Bowel sounds normal.  Ext: No edema  [id number]  ROM spine, shoulders, hips and knees.  Skin: Intact, no ulcerations or rash noted.  Psych: Good eye contact, normal affect. Memory intact not anxious or depressed appearing.  CNS: CN 2-12 intact, power, tone and sensation normal throughout.       Assessment & Plan:  Diabetes mellitus with nephropathy Has been notably high in past several weeks, however , with re institution of insulin, marked improvement noted and pt has her log book which supports this, she will call with furhter concerns and is to return in March  for f/u of her chronic illnesses. Patient advised to reduce carb and sweets, commit to regular physical activity, take meds as prescribed, test blood as directed, and attempt to lose weight, to improve blood sugar control.   HYPERTENSION Adequate but sub optimal control, no med change at this time  BACK PAIN WITH RADICULOPATHY Unchanged, chronic pain management to be continued as  Before. Pt cautioned re increased fall risk and advised to be careful in this regard

## 2013-01-29 ENCOUNTER — Telehealth: Payer: Self-pay | Admitting: Family Medicine

## 2013-01-29 MED ORDER — METFORMIN HCL 1000 MG PO TABS: 1000.0000 mg | ORAL_TABLET | Freq: Two times a day (BID) | ORAL | Status: DC

## 2013-01-29 NOTE — Telephone Encounter (Signed)
pls check and see if metformin 1000mg  twice daily is covered by [facility], the ER is not, paper left in your are and pt states she is "running out of meds. I will enter the new pill, follow through and let me know if this is still a prob and what is the alternative

## 2013-01-30 NOTE — Telephone Encounter (Signed)
Med sent to pharmacy will look out for pa

## 2013-02-02 ENCOUNTER — Ambulatory Visit (HOSPITAL_COMMUNITY): Payer: Medicare HMO

## 2013-02-04 ENCOUNTER — Other Ambulatory Visit: Payer: Self-pay

## 2013-02-04 MED ORDER — HYDROCODONE-ACETAMINOPHEN 10-325 MG PO TABS: ORAL_TABLET | ORAL | Status: DC

## 2013-02-06 ENCOUNTER — Ambulatory Visit (HOSPITAL_COMMUNITY)
Admission: RE | Admit: 2013-02-06 | Discharge: 2013-02-06 | Disposition: A | Discharge status: Home / Self Care | Payer: Medicare HMO | Source: Ambulatory Visit | Attending: Family Medicine | Admitting: Family Medicine

## 2013-02-06 DIAGNOSIS — Z1231 Encounter for screening mammogram for malignant neoplasm of breast: Secondary | ICD-10-CM | POA: Insufficient documentation

## 2013-02-06 DIAGNOSIS — Z139 Encounter for screening, unspecified: Secondary | ICD-10-CM

## 2013-02-26 ENCOUNTER — Other Ambulatory Visit: Payer: Self-pay | Admitting: Family Medicine

## 2013-03-01 NOTE — Assessment & Plan Note (Signed)
Unchanged, chronic pain management to be continued as  Before. Pt cautioned re increased fall risk and advised to be careful in this regard

## 2013-03-01 NOTE — Assessment & Plan Note (Addendum)
Adequate but sub optimal control, no med change at this time

## 2013-03-01 NOTE — Assessment & Plan Note (Signed)
Has been notably high in past several weeks, however , with re institution of insulin, marked improvement noted and pt has her log book which supports this, she will call with furhter concerns and is to return in March for f/u of her chronic illnesses. Patient advised to reduce carb and sweets, commit to regular physical activity, take meds as prescribed, test blood as directed, and attempt to lose weight, to improve blood sugar control.

## 2013-03-06 ENCOUNTER — Other Ambulatory Visit: Payer: Self-pay

## 2013-03-06 MED ORDER — HYDROCODONE-ACETAMINOPHEN 10-325 MG PO TABS: ORAL_TABLET | ORAL | Status: DC

## 2013-03-10 ENCOUNTER — Other Ambulatory Visit: Payer: Self-pay | Admitting: Family Medicine

## 2013-03-10 LAB — CBC WITH DIFFERENTIAL/PLATELET
BASOS ABS: 0 10*3/uL (ref 0.0–0.1)
BASOS PCT: 1 % (ref 0–1)
Eosinophils Absolute: 0.3 10*3/uL (ref 0.0–0.7)
Eosinophils Relative: 5 % (ref 0–5)
HCT: 35.8 % — ABNORMAL LOW (ref 36.0–46.0)
Hemoglobin: 11.7 g/dL — ABNORMAL LOW (ref 12.0–15.0)
LYMPHS PCT: 35 % (ref 12–46)
Lymphs Abs: 2.5 10*3/uL (ref 0.7–4.0)
MCH: 30.5 pg (ref 26.0–34.0)
MCHC: 32.7 g/dL (ref 30.0–36.0)
MCV: 93.2 fL (ref 78.0–100.0)
MONO ABS: 0.5 10*3/uL (ref 0.1–1.0)
Monocytes Relative: 7 % (ref 3–12)
NEUTROS ABS: 3.7 10*3/uL (ref 1.7–7.7)
Neutrophils Relative %: 52 % (ref 43–77)
PLATELETS: 226 10*3/uL (ref 150–400)
RBC: 3.84 MIL/uL — ABNORMAL LOW (ref 3.87–5.11)
RDW: 13.6 % (ref 11.5–15.5)
WBC: 7.1 10*3/uL (ref 4.0–10.5)

## 2013-03-10 LAB — BASIC METABOLIC PANEL WITH GFR
BUN: 40 mg/dL — ABNORMAL HIGH (ref 6–23)
CHLORIDE: 101 meq/L (ref 96–112)
CO2: 26 meq/L (ref 19–32)
Calcium: 8.9 mg/dL (ref 8.4–10.5)
Creat: 1.49 mg/dL — ABNORMAL HIGH (ref 0.50–1.10)
GFR, Est African American: 38 mL/min — ABNORMAL LOW
GFR, Est Non African American: 33 mL/min — ABNORMAL LOW
GLUCOSE: 271 mg/dL — AB (ref 70–99)
POTASSIUM: 4.7 meq/L (ref 3.5–5.3)
SODIUM: 137 meq/L (ref 135–145)

## 2013-03-10 LAB — HEMOGLOBIN A1C
HEMOGLOBIN A1C: 7.9 % — AB (ref <5.7)
MEAN PLASMA GLUCOSE: 180 mg/dL — AB (ref <117)

## 2013-03-11 LAB — MICROALBUMIN / CREATININE URINE RATIO
CREATININE, URINE: 109.4 mg/dL
MICROALB UR: 0.68 mg/dL (ref 0.00–1.89)
MICROALB/CREAT RATIO: 6.2 mg/g (ref 0.0–30.0)

## 2013-03-11 LAB — TSH: TSH: 5.857 u[IU]/mL — ABNORMAL HIGH (ref 0.350–4.500)

## 2013-03-12 ENCOUNTER — Encounter (INDEPENDENT_AMBULATORY_CARE_PROVIDER_SITE_OTHER): Payer: Self-pay

## 2013-03-12 ENCOUNTER — Ambulatory Visit (INDEPENDENT_AMBULATORY_CARE_PROVIDER_SITE_OTHER): Payer: Medicare HMO | Admitting: Family Medicine

## 2013-03-12 ENCOUNTER — Encounter: Payer: Self-pay | Admitting: Family Medicine

## 2013-03-12 VITALS — BP 130/70 | HR 78 | Resp 18 | Ht 62.0 in | Wt 170.0 lb

## 2013-03-12 DIAGNOSIS — R296 Repeated falls: Secondary | ICD-10-CM

## 2013-03-12 DIAGNOSIS — F329 Major depressive disorder, single episode, unspecified: Secondary | ICD-10-CM

## 2013-03-12 DIAGNOSIS — E1121 Type 2 diabetes mellitus with diabetic nephropathy: Secondary | ICD-10-CM

## 2013-03-12 DIAGNOSIS — Z9181 History of falling: Secondary | ICD-10-CM

## 2013-03-12 DIAGNOSIS — E1129 Type 2 diabetes mellitus with other diabetic kidney complication: Secondary | ICD-10-CM

## 2013-03-12 DIAGNOSIS — E785 Hyperlipidemia, unspecified: Secondary | ICD-10-CM

## 2013-03-12 DIAGNOSIS — I10 Essential (primary) hypertension: Secondary | ICD-10-CM

## 2013-03-12 DIAGNOSIS — N058 Unspecified nephritic syndrome with other morphologic changes: Secondary | ICD-10-CM

## 2013-03-12 DIAGNOSIS — IMO0002 Reserved for concepts with insufficient information to code with codable children: Secondary | ICD-10-CM

## 2013-03-12 DIAGNOSIS — R7989 Other specified abnormal findings of blood chemistry: Secondary | ICD-10-CM | POA: Insufficient documentation

## 2013-03-12 DIAGNOSIS — F3289 Other specified depressive episodes: Secondary | ICD-10-CM

## 2013-03-12 DIAGNOSIS — R946 Abnormal results of thyroid function studies: Secondary | ICD-10-CM

## 2013-03-12 LAB — T4, FREE: Free T4: 1.24 ng/dL (ref 0.80–1.80)

## 2013-03-12 LAB — T3, FREE: T3 FREE: 2.7 pg/mL (ref 2.3–4.2)

## 2013-03-12 MED ORDER — LOSARTAN POTASSIUM 25 MG PO TABS: 25.0000 mg | ORAL_TABLET | Freq: Every day | ORAL | Status: DC

## 2013-03-12 MED ORDER — OMEPRAZOLE 20 MG PO CPDR: 20.0000 mg | DELAYED_RELEASE_CAPSULE | Freq: Every day | ORAL | Status: DC

## 2013-03-12 NOTE — Progress Notes (Signed)
   Subjective:    Patient ID: [name] [name] [name], female    DOB: Oct 04, 1929, 80 y.o.   MRN: [id number]  HPI The PT is here for follow up and re-evaluation of chronic medical conditions, medication management and review of any available recent lab and radiology data.  Preventive health is updated, specifically  Cancer screening and Immunization.   Questions or concerns regarding consultations or procedures which the PT has had in the interim are  addressed. The PT denies any adverse reactions to current medications since the last visit.  There are no new concerns.  There are no specific complaints , has blood sugar diary , which shows marked improvement in past several weeks, denies hypoglycemic episodes, polyuria or polydipsia      Review of Systems See HPI Denies recent fever or chills. Denies sinus pressure, nasal congestion, ear pain or sore throat. Denies chest congestion, productive cough or wheezing. Denies chest pains, palpitations and leg swelling Denies abdominal pain, nausea, vomiting,diarrhea or constipation.   Denies dysuria, frequency, hesitancy or incontinence. Chronic  joint pain, swelling and limitation in mobility. Denies headaches, seizures, numbness, or tingling. Denies depression, anxiety or insomnia. Denies skin break down or rash.        Objective:   Physical Exam  BP 130/70  Pulse 78  Resp 18  Ht 5\' 2"  (1.575 m)  Wt 170 lb (77.111 kg)  BMI 31.09 kg/m2  SpO2 96% Patient alert and oriented and in no cardiopulmonary distress.  HEENT: No facial asymmetry, EOMI, no sinus tenderness,  oropharynx pink and moist.  Neck supple no adenopathy.  Chest: Clear to auscultation bilaterally.  CVS: S1, S2 systolic murmur, no S3.  ABD: Soft non tender. Bowel sounds normal.  Ext: No edema  MS: decreased  ROM spine, shoulders, hips and knees.Marked deformity of fingers and toes with nodules  Skin: Intact, no ulcerations or rash noted.  Psych: Good eye contact,  normal affect. Memory intact not anxious or depressed appearing.  CNS: CN 2-12 intact, [name] [name], tone and sensation normal throughout.       Assessment & Plan:  Diabetes mellitus with nephropathy Deteriorated, but no change in current medication, has improved in past several weeks following recent epidural at end of last year she had several weeks of markedly elevated blood sugar Patient advised to reduce carb and sweets, commit to regular physical activity, take meds as prescribed, test blood as directed, and attempt to lose weight, to improve blood sugar control.   HYPERTENSION Controlled, no change in medication DASH diet and commitment to daily physical activity for a minimum of 30 minutes discussed and encouraged, as a part of hypertension management. The importance of attaining a healthy weight is also discussed.   BACK PAIN WITH RADICULOPATHY Chronic and unchanged, continue oral pain meds as before  DEPRESSION Controlled, no change in medication   Multiple falls Fall risk reduction discussed  Hyperlipidemia Updated lab needed at/ before next visit. Hyperlipidemia:Low fat diet discussed and encouraged.

## 2013-03-12 NOTE — Patient Instructions (Signed)
CPE as before  [place]  Ensure you drink at least 56 ounces of water every day.  You need repeat chem 7 and EGFr next Wednesday, so I can see if we can continue the metformin   You will be referred for an [place] of your thyroid gland  Please let me know if you decide on rheumatology evaluation  Fall Prevention and Home Safety Falls cause injuries and can affect all age groups. It is possible to prevent falls.  HOW TO PREVENT FALLS  Wear shoes with rubber soles that do not have an opening for your toes.  Keep the inside and outside of your house well lit.  Use night lights throughout your home.  Remove clutter from floors.  Clean up floor spills.  Remove throw rugs or fasten them to the floor with carpet tape.  Do not place electrical cords across pathways.  Put grab bars by your tub, shower, and toilet. Do not use towel bars as grab bars.  Put handrails on both sides of the stairway. Fix loose handrails.  Do not climb on stools or stepladders, if possible.  Do not wax your floors.  Repair uneven or unsafe sidewalks, walkways, or stairs.  Keep items you use a lot within reach.  Be aware of pets.  Keep emergency numbers next to the telephone.  Put smoke detectors in your home and near bedrooms. Ask your doctor what other things you can do to prevent falls. Document Released: 10/14/2008 Document Revised: 06/19/2011 Document Reviewed: 03/20/2011 [facility] Patient Information 2014 [place], [place].

## 2013-03-15 NOTE — Assessment & Plan Note (Signed)
Updated lab needed at/ before next visit. Hyperlipidemia:Low fat diet discussed and encouraged.   

## 2013-03-15 NOTE — Assessment & Plan Note (Signed)
Controlled, no change in medication DASH diet and commitment to daily physical activity for a minimum of 30 minutes discussed and encouraged, as a part of hypertension management. The importance of attaining a healthy weight is also discussed.  

## 2013-03-15 NOTE — Assessment & Plan Note (Signed)
Chronic and unchanged, continue oral pain meds as before

## 2013-03-15 NOTE — Assessment & Plan Note (Signed)
Controlled, no change in medication  

## 2013-03-15 NOTE — Assessment & Plan Note (Signed)
Fall risk reduction discussed 

## 2013-03-15 NOTE — Assessment & Plan Note (Signed)
Deteriorated, but no change in current medication, has improved in past several weeks following recent epidural at end of last year she had several weeks of markedly elevated blood sugar Patient advised to reduce carb and sweets, commit to regular physical activity, take meds as prescribed, test blood as directed, and attempt to lose weight, to improve blood sugar control.

## 2013-03-16 ENCOUNTER — Ambulatory Visit (HOSPITAL_COMMUNITY): Payer: Medicare HMO

## 2013-03-18 LAB — COMPLETE METABOLIC PANEL WITH GFR
ALT: 12 U/L (ref 0–35)
AST: 19 U/L (ref 0–37)
Albumin: 4.4 g/dL (ref 3.5–5.2)
Alkaline Phosphatase: 33 U/L — ABNORMAL LOW (ref 39–117)
BILIRUBIN TOTAL: 0.4 mg/dL (ref 0.2–1.2)
BUN: 39 mg/dL — ABNORMAL HIGH (ref 6–23)
CALCIUM: 9.6 mg/dL (ref 8.4–10.5)
CHLORIDE: 98 meq/L (ref 96–112)
CO2: 29 meq/L (ref 19–32)
Creat: 1.16 mg/dL — ABNORMAL HIGH (ref 0.50–1.10)
GFR, EST AFRICAN AMERICAN: 51 mL/min — AB
GFR, Est Non African American: 44 mL/min — ABNORMAL LOW
Glucose, Bld: 197 mg/dL — ABNORMAL HIGH (ref 70–99)
Potassium: 4.5 mEq/L (ref 3.5–5.3)
SODIUM: 136 meq/L (ref 135–145)
Total Protein: 7 g/dL (ref 6.0–8.3)

## 2013-03-19 ENCOUNTER — Ambulatory Visit (HOSPITAL_COMMUNITY): Payer: Medicare HMO

## 2013-03-24 ENCOUNTER — Ambulatory Visit (HOSPITAL_COMMUNITY)
Admission: RE | Admit: 2013-03-24 | Discharge: 2013-03-24 | Disposition: A | Discharge status: Home / Self Care | Payer: Medicare HMO | Source: Ambulatory Visit | Attending: Family Medicine | Admitting: Family Medicine

## 2013-03-24 DIAGNOSIS — E041 Nontoxic single thyroid nodule: Secondary | ICD-10-CM | POA: Insufficient documentation

## 2013-03-24 DIAGNOSIS — E042 Nontoxic multinodular goiter: Secondary | ICD-10-CM | POA: Insufficient documentation

## 2013-03-24 DIAGNOSIS — R7989 Other specified abnormal findings of blood chemistry: Secondary | ICD-10-CM

## 2013-03-25 ENCOUNTER — Other Ambulatory Visit: Payer: Self-pay

## 2013-03-25 DIAGNOSIS — E042 Nontoxic multinodular goiter: Secondary | ICD-10-CM

## 2013-03-27 ENCOUNTER — Other Ambulatory Visit: Payer: Self-pay

## 2013-03-27 MED ORDER — HYDROCODONE-ACETAMINOPHEN 10-325 MG PO TABS: ORAL_TABLET | ORAL | Status: DC

## 2013-04-02 ENCOUNTER — Ambulatory Visit (INDEPENDENT_AMBULATORY_CARE_PROVIDER_SITE_OTHER): Payer: Medicare HMO | Admitting: Otolaryngology

## 2013-04-02 DIAGNOSIS — D449 Neoplasm of uncertain behavior of unspecified endocrine gland: Secondary | ICD-10-CM

## 2013-04-06 ENCOUNTER — Other Ambulatory Visit (INDEPENDENT_AMBULATORY_CARE_PROVIDER_SITE_OTHER): Payer: Self-pay | Admitting: Otolaryngology

## 2013-04-06 DIAGNOSIS — E041 Nontoxic single thyroid nodule: Secondary | ICD-10-CM

## 2013-04-08 ENCOUNTER — Ambulatory Visit (INDEPENDENT_AMBULATORY_CARE_PROVIDER_SITE_OTHER): Payer: Medicare HMO | Admitting: Family Medicine

## 2013-04-08 ENCOUNTER — Other Ambulatory Visit (HOSPITAL_COMMUNITY)
Admission: RE | Admit: 2013-04-08 | Discharge: 2013-04-08 | Disposition: A | Discharge status: Home / Self Care | Payer: Medicare HMO | Source: Ambulatory Visit | Attending: Family Medicine | Admitting: Family Medicine

## 2013-04-08 ENCOUNTER — Encounter (INDEPENDENT_AMBULATORY_CARE_PROVIDER_SITE_OTHER): Payer: Self-pay

## 2013-04-08 ENCOUNTER — Encounter: Payer: Self-pay | Admitting: Family Medicine

## 2013-04-08 VITALS — BP 148/82 | HR 83 | Resp 16 | Wt 168.4 lb

## 2013-04-08 DIAGNOSIS — E785 Hyperlipidemia, unspecified: Secondary | ICD-10-CM

## 2013-04-08 DIAGNOSIS — B3789 Other sites of candidiasis: Secondary | ICD-10-CM

## 2013-04-08 DIAGNOSIS — Z124 Encounter for screening for malignant neoplasm of cervix: Secondary | ICD-10-CM | POA: Diagnosis present

## 2013-04-08 DIAGNOSIS — IMO0002 Reserved for concepts with insufficient information to code with codable children: Secondary | ICD-10-CM

## 2013-04-08 DIAGNOSIS — Z0000 Encounter for general adult medical examination without abnormal findings: Secondary | ICD-10-CM

## 2013-04-08 DIAGNOSIS — Z1239 Encounter for other screening for malignant neoplasm of breast: Secondary | ICD-10-CM

## 2013-04-08 DIAGNOSIS — Z1212 Encounter for screening for malignant neoplasm of rectum: Secondary | ICD-10-CM

## 2013-04-08 DIAGNOSIS — E1121 Type 2 diabetes mellitus with diabetic nephropathy: Secondary | ICD-10-CM

## 2013-04-08 DIAGNOSIS — Z1211 Encounter for screening for malignant neoplasm of colon: Secondary | ICD-10-CM

## 2013-04-08 DIAGNOSIS — I10 Essential (primary) hypertension: Secondary | ICD-10-CM

## 2013-04-08 LAB — POC HEMOCCULT BLD/STL (OFFICE/1-CARD/DIAGNOSTIC): FECAL OCCULT BLD: NEGATIVE

## 2013-04-08 NOTE — Patient Instructions (Signed)
F/u June 12 or after, call if you need me before  Fasting lipid, cmp and EGFr and HBa1C June 10 or after   You will be referred for epidural injection mid May as requested

## 2013-04-09 ENCOUNTER — Ambulatory Visit (HOSPITAL_COMMUNITY)
Admission: RE | Admit: 2013-04-09 | Discharge: 2013-04-09 | Disposition: A | Discharge status: Home / Self Care | Payer: Medicare HMO | Source: Ambulatory Visit | Attending: Otolaryngology | Admitting: Otolaryngology

## 2013-04-09 ENCOUNTER — Encounter (HOSPITAL_COMMUNITY): Payer: Self-pay

## 2013-04-09 DIAGNOSIS — B3789 Other sites of candidiasis: Secondary | ICD-10-CM | POA: Insufficient documentation

## 2013-04-09 DIAGNOSIS — E042 Nontoxic multinodular goiter: Secondary | ICD-10-CM | POA: Insufficient documentation

## 2013-04-09 DIAGNOSIS — E041 Nontoxic single thyroid nodule: Secondary | ICD-10-CM

## 2013-04-09 MED ORDER — NYSTATIN 100000 UNIT/GM EX POWD: CUTANEOUS | Status: DC

## 2013-04-09 MED ORDER — LIDOCAINE HCL (PF) 2 % IJ SOLN
INTRAMUSCULAR | Status: AC
  Administered 2013-04-09: 10 mL
  Filled 2013-04-09: qty 10

## 2013-04-09 MED ORDER — LIDOCAINE HCL (PF) 2 % IJ SOLN
10.0000 mL | Freq: Once | INTRAMUSCULAR | Status: AC
  Administered 2013-04-09: 10 mL

## 2013-04-09 NOTE — Assessment & Plan Note (Signed)
Rash under rigth breast, scant, h/o inguinal rash now resolved, depneds on hydrocortisone cream alot

## 2013-04-09 NOTE — Assessment & Plan Note (Signed)
Annual exam as documented. Counseling done  re healthy lifestyle involving commitment to 150 minutes exercise per week, heart healthy diet, and attaining healthy weight.The importance of adequate sleep also discussed. Regular seat belt use and safe storage  of firearms  is also discussed. . Immunization and cancer screening needs are specifically addressed at this visit.

## 2013-04-09 NOTE — Discharge Instructions (Addendum)
Thyroid Biopsy °The thyroid gland is a butterfly-shaped gland situated in the front of the neck. It produces hormones which affect metabolism, growth and development, and body temperature. A thyroid biopsy is a procedure in which small samples of tissue or fluid are removed from the thyroid gland or mass and examined under a microscope. This test is done to determine the cause of thyroid problems, such as infection, cancer, or other thyroid problems. °There are 2 ways to obtain samples: °1. Fine needle biopsy. Samples are removed using a thin needle inserted through the skin and into the thyroid gland or mass. °2. Open biopsy. Samples are removed after a cut (incision) is made through the skin. °LET YOUR CAREGIVER KNOW ABOUT:  °· Allergies. °· Medications taken including herbs, eye drops, over-the-counter medications, and creams. °· Use of steroids (by mouth or creams). °· Previous problems with anesthetics or numbing medicine. °· Possibility of pregnancy, if this applies. °· History of blood clots (thrombophlebitis). °· History of bleeding or blood problems. °· Previous surgery. °· Other health problems. °RISKS AND COMPLICATIONS °· Bleeding from the site. The risk of bleeding is higher if you have a bleeding disorder or are taking any blood thinning medications (anticoagulants). °· Infection. °· Injury to structures near the thyroid gland. °BEFORE THE PROCEDURE  °This is a procedure that can be done as an outpatient. Confirm the time that you need to arrive for your procedure. Confirm whether there is a need to fast or withhold any medications. A blood sample may be done to determine your blood clotting time. Medicine may be given to help you relax (sedative). °PROCEDURE °Fine needle biopsy. °You will be awake during the procedure. You may be asked to lie on your back with your head tipped backward to extend your neck. Let your caregiver know if you cannot tolerate the positioning. An area on your neck will be  cleansed. A needle is inserted through the skin of your neck. You may feel a mild discomfort during this procedure. You may be asked to avoid coughing, talking, swallowing, or making sounds during some portions of the procedure. The needle is withdrawn once tissue or fluid samples have been removed. Pressure may be applied to the neck to reduce swelling and ensure that bleeding has stopped. The samples will be sent for examination.  °Open biopsy. °You will be given general anesthesia. You will be asleep during the procedure. An incision is made in your neck. A sample of thyroid tissue or the mass is removed. The tissue sample or mass will be sent for examination. The sample or mass may be examined during the biopsy. If the sample or mass contains cancer cells, some or all of the thyroid gland may be removed. The incision is closed with stitches. °AFTER THE PROCEDURE  °Your recovery will be assessed and monitored. If there are no problems, as an outpatient, you should be able to go home shortly after the procedure. °If you had a fine needle biopsy: °· You may have soreness at the biopsy site for 1 to 2 days. °If you had an open biopsy:  °· You may have soreness at the biopsy site for 3 to 4 days. °· You may have a hoarse voice or sore throat for 1 to 2 days. °Obtaining the Test Results °It is your responsibility to obtain your test results. Do not assume everything is normal if you have not heard from your caregiver or the medical facility. It is important for you to follow up   on all of your test results. °HOME CARE INSTRUCTIONS  °· Keeping your head raised on a pillow when you are lying down may ease biopsy site discomfort. °· Supporting the back of your head and neck with both hands as you sit up from a lying position may ease biopsy site discomfort. °· Only take over-the-counter or prescription medicines for pain, discomfort, or fever as directed by your caregiver. °· Throat lozenges or gargling with warm salt  water may help to soothe a sore throat. °SEEK IMMEDIATE MEDICAL CARE IF:  °· You have severe bleeding from the biopsy site. °· You have difficulty swallowing. °· You have a fever. °· You have increased pain, swelling, redness, or warmth at the biopsy site. °· You notice pus coming from the biopsy site. °· You have swollen glands (lymph nodes) in your neck. °Document Released: 10/15/2006 Document Revised: 04/14/2012 Document Reviewed: 03/17/2008 °[employer]® Patient Information ©2014 [employer]. ° °

## 2013-04-09 NOTE — Progress Notes (Signed)
   Subjective:    Patient ID: [name] [name], female    DOB: [birth year], 80 y.o.   MRN: [id number]  HPI  Patient is in for annual exam Health maintainance is reviewed and updated, specifically screening tests and recommended immunizations. Recent lab and radiologic data, since previous visit is also reviewed with the patient. Healthy lifestyle as far as commitment to regular physical activity, heart healthy diet , safe habits, as far as seat belt  use The importance of adequate rest is also discussed.      Review of Systems See HPI Denies recent fever or chills. Denies sinus pressure, nasal congestion, ear pain or sore throat. Denies chest congestion, productive cough or wheezing. Denies chest pains, palpitations and leg swelling Denies abdominal pain, nausea, vomiting,diarrhea or constipation.   Denies dysuria, frequency, hesitancy or incontinence. Increasing back pain in past several weeks, feels as though she will benefit form a  Denies headaches, seizures, numbness, or tingling. Denies uncontrolled  depression, anxiety or insomnia. C/o itchy rash under right breast and recent rash in groin is better, no ulceration        Objective:   Physical Exam  Pleasant well nourished female, alert and oriented x 3, in no cardio-pulmonary distress. Afebrile. HEENT No facial trauma or asymetry. Sinuses non tender.  EOMI, PERTL, fundoscopic exam , no hemorhage or exudate.  External ears normal, tympanic membranes clear. Oropharynx moist, no exudate fairly , good dentition. Neck: decreased ROM, no adenopathy,JVD or thyromegaly.No bruits.  Chest: Clear to ascultation bilaterally.No crackles or wheezes. Non tender to palpation  Breast: No asymetry,no masses. No nipple discharge or inversion. No axillary or supraclavicular adenopathy  Cardiovascular system; Heart sounds normal,  S1 and  S2 ,no S3.  Systolic  murmur, or thrill. Apical beat not displaced. Regualr rate Peripheral  pulses normal.  Abdomen: Soft, non tender, no organomegaly or masses. No bruits. Bowel sounds normal. No guarding, tenderness or rebound.  Rectal:  No mass. Guaiac negative stool.  GU: External genitalia normal. No lesions. Vaginal canal erythematous, atrophic, introitus is very small, scant physiologic  discharge. Uterus absent , no adnexal masses, no adnexal tenderness.  [id number]  ROM of spine, hips , shoulders and knees. Deformity ,swelling and  crepitus noted.  muscle wasting and  Atrophy noted in feet   Neurologic: Cranial nerves 2 to 12 intact. Power, tone ,sensation and reflexes reduced in lower extremities, normal in upper extremities   Disturbance in gait due to severe arthritis, uses a walker for assistance, and fine  tremor.  Skin: Intact, erythematous rash under right breast, hypopigmented papular lesions on back and multiple freckles and rough areas on trunk  Psych; Normal mood and affect. Judgement and concentration normal       Assessment & Plan:  Routine general medical examination at a health care facility Annual exam as documented. Counseling done  re healthy lifestyle involving commitment to 150 minutes exercise per week, heart healthy diet, and attaining healthy weight.The importance of adequate sleep also discussed. Regular seat belt use and safe storage  of firearms  is also discussed. . Immunization and cancer screening needs are specifically addressed at this visit.   Candidiasis of breast Rash under rigth breast, scant, h/o inguinal rash now resolved, depneds on hydrocortisone cream alot

## 2013-04-09 NOTE — Progress Notes (Signed)
Pt tolerated procedure well, VS WDL. Procedure completed, samples collected and sent to lab.

## 2013-04-15 ENCOUNTER — Other Ambulatory Visit: Payer: Self-pay | Admitting: Family Medicine

## 2013-04-15 DIAGNOSIS — M549 Dorsalgia, unspecified: Secondary | ICD-10-CM

## 2013-04-20 ENCOUNTER — Ambulatory Visit
Admission: RE | Admit: 2013-04-20 | Discharge: 2013-04-20 | Disposition: A | Discharge status: Home / Self Care | Payer: Commercial Managed Care - HMO | Source: Ambulatory Visit | Attending: Family Medicine | Admitting: Family Medicine

## 2013-04-20 DIAGNOSIS — M549 Dorsalgia, unspecified: Secondary | ICD-10-CM

## 2013-04-20 MED ORDER — METHYLPREDNISOLONE ACETATE 40 MG/ML INJ SUSP (RADIOLOG
120.0000 mg | Freq: Once | INTRAMUSCULAR | Status: AC
  Administered 2013-04-20: 120 mg via EPIDURAL

## 2013-04-20 MED ORDER — IOHEXOL 180 MG/ML  SOLN
1.0000 mL | Freq: Once | INTRAMUSCULAR | Status: AC | PRN
  Administered 2013-04-20: 1 mL via EPIDURAL

## 2013-04-24 ENCOUNTER — Other Ambulatory Visit: Payer: Self-pay | Admitting: Family Medicine

## 2013-05-08 ENCOUNTER — Other Ambulatory Visit: Payer: Self-pay

## 2013-05-08 MED ORDER — HYDROCODONE-ACETAMINOPHEN 10-325 MG PO TABS: ORAL_TABLET | ORAL | Status: DC

## 2013-05-26 ENCOUNTER — Telehealth: Payer: Self-pay

## 2013-05-26 ENCOUNTER — Ambulatory Visit (INDEPENDENT_AMBULATORY_CARE_PROVIDER_SITE_OTHER): Payer: Commercial Managed Care - HMO | Admitting: Urology

## 2013-05-26 DIAGNOSIS — N318 Other neuromuscular dysfunction of bladder: Secondary | ICD-10-CM

## 2013-05-26 NOTE — Telephone Encounter (Signed)
Patient given appt for 5/27

## 2013-05-26 NOTE — Telephone Encounter (Signed)
The only med I may add is a fentanyl patch, otherwise will need help from  Pain specialist.She will need an oV for addition of the fentanyl patch however so this will need to be arranged

## 2013-05-27 ENCOUNTER — Ambulatory Visit (INDEPENDENT_AMBULATORY_CARE_PROVIDER_SITE_OTHER): Payer: Medicare HMO | Admitting: Family Medicine

## 2013-05-27 ENCOUNTER — Encounter (INDEPENDENT_AMBULATORY_CARE_PROVIDER_SITE_OTHER): Payer: Self-pay

## 2013-05-27 ENCOUNTER — Encounter: Payer: Self-pay | Admitting: Family Medicine

## 2013-05-27 VITALS — BP 134/76 | HR 66 | Resp 18 | Ht 62.0 in

## 2013-05-27 DIAGNOSIS — N058 Unspecified nephritic syndrome with other morphologic changes: Secondary | ICD-10-CM

## 2013-05-27 DIAGNOSIS — E1129 Type 2 diabetes mellitus with other diabetic kidney complication: Secondary | ICD-10-CM

## 2013-05-27 DIAGNOSIS — I10 Essential (primary) hypertension: Secondary | ICD-10-CM

## 2013-05-27 DIAGNOSIS — E1121 Type 2 diabetes mellitus with diabetic nephropathy: Secondary | ICD-10-CM

## 2013-05-27 DIAGNOSIS — IMO0002 Reserved for concepts with insufficient information to code with codable children: Secondary | ICD-10-CM

## 2013-05-27 DIAGNOSIS — M25562 Pain in left knee: Secondary | ICD-10-CM

## 2013-05-27 MED ORDER — PREDNISONE (PAK) 5 MG PO TABS: 5.0000 mg | ORAL_TABLET | ORAL | Status: DC

## 2013-05-27 MED ORDER — GABAPENTIN 100 MG PO CAPS: 100.0000 mg | ORAL_CAPSULE | Freq: Three times a day (TID) | ORAL | Status: DC

## 2013-05-27 NOTE — Patient Instructions (Signed)
F/u as before  Two injections today, and  New for pain is prednisone short term and gabapentin  Please get x rays of back and left knee as soon as possible   New pain med will be started when nexrt due, will Ok dispense two days before actual due date this one time only, you may need tosee pain med

## 2013-05-28 DIAGNOSIS — IMO0002 Reserved for concepts with insufficient information to code with codable children: Secondary | ICD-10-CM

## 2013-05-28 DIAGNOSIS — M25569 Pain in unspecified knee: Secondary | ICD-10-CM

## 2013-05-28 MED ORDER — KETOROLAC TROMETHAMINE 60 MG/2ML IM SOLN
60.0000 mg | Freq: Once | INTRAMUSCULAR | Status: AC
  Administered 2013-05-28: 60 mg via INTRAMUSCULAR

## 2013-05-28 MED ORDER — METHYLPREDNISOLONE ACETATE 80 MG/ML IJ SUSP
80.0000 mg | Freq: Once | INTRAMUSCULAR | Status: AC
Start: 2013-05-28 — End: 2013-05-28
  Administered 2013-05-28: 80 mg via INTRAMUSCULAR

## 2013-05-30 ENCOUNTER — Ambulatory Visit (HOSPITAL_COMMUNITY)
Admission: RE | Admit: 2013-05-30 | Discharge: 2013-05-30 | Disposition: A | Discharge status: Home / Self Care | Payer: Medicare HMO | Source: Ambulatory Visit | Attending: Family Medicine | Admitting: Family Medicine

## 2013-05-30 DIAGNOSIS — Z96659 Presence of unspecified artificial knee joint: Secondary | ICD-10-CM | POA: Insufficient documentation

## 2013-05-30 DIAGNOSIS — M4120 Other idiopathic scoliosis, site unspecified: Secondary | ICD-10-CM | POA: Insufficient documentation

## 2013-05-30 DIAGNOSIS — IMO0002 Reserved for concepts with insufficient information to code with codable children: Secondary | ICD-10-CM

## 2013-05-30 DIAGNOSIS — Q762 Congenital spondylolisthesis: Secondary | ICD-10-CM | POA: Insufficient documentation

## 2013-05-30 DIAGNOSIS — M25562 Pain in left knee: Secondary | ICD-10-CM

## 2013-05-30 DIAGNOSIS — M25569 Pain in unspecified knee: Secondary | ICD-10-CM | POA: Insufficient documentation

## 2013-06-04 ENCOUNTER — Other Ambulatory Visit: Payer: Self-pay | Admitting: Family Medicine

## 2013-06-04 MED ORDER — OXYCODONE-ACETAMINOPHEN 10-325 MG PO TABS: ORAL_TABLET | ORAL | Status: DC

## 2013-06-12 NOTE — Telephone Encounter (Signed)
[employer] referrals submitted for 1/8 Dr. Irving Shows and 1/13 Dr Eulogio Ditch appts as required.

## 2013-06-17 ENCOUNTER — Other Ambulatory Visit: Payer: Self-pay

## 2013-06-17 ENCOUNTER — Other Ambulatory Visit: Payer: Self-pay | Admitting: Family Medicine

## 2013-06-17 DIAGNOSIS — I10 Essential (primary) hypertension: Secondary | ICD-10-CM

## 2013-06-17 MED ORDER — DILTIAZEM HCL ER 240 MG PO CP24: ORAL_CAPSULE | ORAL | Status: DC

## 2013-06-17 MED ORDER — FENOFIBRATE 145 MG PO TABS: ORAL_TABLET | ORAL | Status: DC

## 2013-06-17 MED ORDER — BENAZEPRIL-HYDROCHLOROTHIAZIDE 20-12.5 MG PO TABS: 2.0000 | ORAL_TABLET | Freq: Every day | ORAL | Status: DC

## 2013-06-19 ENCOUNTER — Other Ambulatory Visit: Payer: Self-pay | Admitting: Family Medicine

## 2013-06-26 ENCOUNTER — Other Ambulatory Visit: Payer: Self-pay

## 2013-06-26 LAB — HEMOGLOBIN A1C
HEMOGLOBIN A1C: 7.6 % — AB (ref <5.7)
MEAN PLASMA GLUCOSE: 171 mg/dL — AB (ref <117)

## 2013-06-26 LAB — COMPLETE METABOLIC PANEL WITH GFR
ALBUMIN: 4.6 g/dL (ref 3.5–5.2)
ALK PHOS: 30 U/L — AB (ref 39–117)
ALT: 15 U/L (ref 0–35)
AST: 16 U/L (ref 0–37)
BUN: 37 mg/dL — AB (ref 6–23)
CO2: 31 mEq/L (ref 19–32)
CREATININE: 1.19 mg/dL — AB (ref 0.50–1.10)
Calcium: 9.8 mg/dL (ref 8.4–10.5)
Chloride: 98 mEq/L (ref 96–112)
GFR, EST NON AFRICAN AMERICAN: 43 mL/min — AB
GFR, Est African American: 49 mL/min — ABNORMAL LOW
GLUCOSE: 159 mg/dL — AB (ref 70–99)
POTASSIUM: 4.9 meq/L (ref 3.5–5.3)
Sodium: 140 mEq/L (ref 135–145)
Total Bilirubin: 0.5 mg/dL (ref 0.2–1.2)
Total Protein: 7 g/dL (ref 6.0–8.3)

## 2013-06-26 LAB — LIPID PANEL
CHOLESTEROL: 155 mg/dL (ref 0–200)
HDL: 74 mg/dL (ref >39)
LDL Cholesterol: 59 mg/dL (ref 0–99)
TRIGLYCERIDES: 109 mg/dL (ref <150)
Total CHOL/HDL Ratio: 2.1 Ratio
VLDL: 22 mg/dL (ref 0–40)

## 2013-06-26 MED ORDER — OXYCODONE-ACETAMINOPHEN 10-325 MG PO TABS: ORAL_TABLET | ORAL | Status: DC

## 2013-06-29 ENCOUNTER — Encounter: Payer: Self-pay | Admitting: Family Medicine

## 2013-06-29 ENCOUNTER — Ambulatory Visit (INDEPENDENT_AMBULATORY_CARE_PROVIDER_SITE_OTHER): Payer: Medicare HMO | Admitting: Family Medicine

## 2013-06-29 VITALS — BP 138/74 | HR 62 | Resp 18 | Ht 62.0 in | Wt 167.0 lb

## 2013-06-29 DIAGNOSIS — R296 Repeated falls: Secondary | ICD-10-CM

## 2013-06-29 DIAGNOSIS — F419 Anxiety disorder, unspecified: Secondary | ICD-10-CM

## 2013-06-29 DIAGNOSIS — E1129 Type 2 diabetes mellitus with other diabetic kidney complication: Secondary | ICD-10-CM

## 2013-06-29 DIAGNOSIS — E1321 Other specified diabetes mellitus with diabetic nephropathy: Secondary | ICD-10-CM

## 2013-06-29 DIAGNOSIS — F329 Major depressive disorder, single episode, unspecified: Secondary | ICD-10-CM

## 2013-06-29 DIAGNOSIS — IMO0002 Reserved for concepts with insufficient information to code with codable children: Secondary | ICD-10-CM

## 2013-06-29 DIAGNOSIS — J309 Allergic rhinitis, unspecified: Secondary | ICD-10-CM

## 2013-06-29 DIAGNOSIS — N058 Unspecified nephritic syndrome with other morphologic changes: Secondary | ICD-10-CM

## 2013-06-29 DIAGNOSIS — K222 Esophageal obstruction: Secondary | ICD-10-CM

## 2013-06-29 DIAGNOSIS — J302 Other seasonal allergic rhinitis: Secondary | ICD-10-CM | POA: Insufficient documentation

## 2013-06-29 DIAGNOSIS — E785 Hyperlipidemia, unspecified: Secondary | ICD-10-CM

## 2013-06-29 DIAGNOSIS — F32A Depression, unspecified: Secondary | ICD-10-CM

## 2013-06-29 DIAGNOSIS — Z9181 History of falling: Secondary | ICD-10-CM

## 2013-06-29 DIAGNOSIS — F341 Dysthymic disorder: Secondary | ICD-10-CM

## 2013-06-29 DIAGNOSIS — K219 Gastro-esophageal reflux disease without esophagitis: Secondary | ICD-10-CM | POA: Insufficient documentation

## 2013-06-29 DIAGNOSIS — I10 Essential (primary) hypertension: Secondary | ICD-10-CM

## 2013-06-29 MED ORDER — FLUTICASONE PROPIONATE 50 MCG/ACT NA SUSP: 2.0000 | Freq: Every day | NASAL | Status: DC

## 2013-06-29 NOTE — Assessment & Plan Note (Addendum)
3 month h/o uncontrolled heartburn and solid food dysphagia, needs GI eval referral made

## 2013-06-29 NOTE — Patient Instructions (Addendum)
F/u first week in October, call if you need me before  Congrats on excellent blood sugar, BP and cholesterol control  Start small bedtime snack of 1 cracker  with small amt of peanut butter so morning sugars range between 100 to 130. Call with concerns  I recommend xray of back, since you are refusing today, if you change your mind no need to  call back, I have entered the test  New for allergies daily flonase and use netty pot (OTC0 to rinse nostrils and sinuses once to twice daily  You are referred to Dr Rourk re difficulty swallowing, ok to call for ENT referral if symptoms persist, as discussed  You are referred to PT for eval for power wheelchair.   Call mid to end next week re referrals if you do not hear from us pls, as discussed  HBA1C, chem 7 and EGFR and Vit D in October before next visit 

## 2013-06-29 NOTE — Progress Notes (Signed)
Subjective:    Patient ID: [name] [name], female    DOB: 1929-10-26, 80 y.o.   MRN: [id number]  HPI The PT is here for follow up and re-evaluation of chronic medical conditions, medication management and review of any available recent lab and radiology data.  Preventive health is updated, specifically  Cancer screening and Immunization.   Questions or concerns regarding consultations or procedures which the PT has had in the interim are  addressed. The PT denies any adverse reactions to current medications since the last visit. Had 3 episodes of fasting sugars at 93 in the past 2 weeks, unknown cause, generally will get numbers between 100 to 130, she is to start 1 small cracker with peanut butter at bedtime and see how this does, overall her  Blood sugars have improved Fell  Twice in the past week, had to call 911 on one occasion, slides off chair, has h/o mulptiple p[revious falls in the home, will consider getting life alert system, but states "my husband is always at home", is interested in [facility]   for use , she understands the process will also entail PT eval for full documentation. Has new bruising to lower back and point tenderness with direct pressure to a specific area following the fall,, no new lower ext symptoms, right lower ext chronic weakness and numbness  Hit back of head no LOC or memory loss, no lump or bump noted, not really concerned about the head 4 month h/o sore throat and sinus congestion  X 4 month, no fever or chills, no green nasal drainage or cough productive of green sputum Review of blood sugar diary shows sugar lows in the 80's at times, advised against this, encouraged bedtime snacks      Review of Systems See HPI Denies recent fever or chills. Denies chest congestion, productive cough or wheezing. Denies chest pains, palpitations and leg swelling Denies abdominal pain, nausea, vomiting,diarrhea or constipation.   Denies dysuria, frequency, hesitancy or  incontinence.  Denies  Uncontrolled depression, anxiety or insomnia.         Objective:   Physical Exam BP 138/74  Pulse 62  Resp 18  Ht 5\' 2"  (1.575 m)  Wt 167 lb 0.6 oz (75.769 kg)  BMI 30.54 kg/m2  SpO2 96% Patient alert and oriented and in no cardiopulmonary distress.  HEENT: No facial asymmetry, EOMI,   oropharynx pink and moist.  Neck supple no JVD, no mass. No sinus tenderness, TM clear bilaterally, nasal mucosa erythematous and edematous Chest: Clear to auscultation bilaterally.  CVS: S1, S2 no murmurs, no S3.Regular rate.  ABD: Soft non tender.   Ext: No edema  MS: decreased  ROM spine, shoulders, hips and knees.  Skin: brusing in lumbar area with tenderness over lumbar spine  Psych: Good eye contact, normal affect. Memory intact not anxious or depressed appearing.  CNS: CN 2-12 intact, power,  normal throughout.no focal deficits noted.        Assessment & Plan:  GERD with stricture 3 month h/o uncontrolled heartburn and solid food dysphagia, needs GI eval referral made  Back pain of lumbar region with sciatica Worsened pain  S/p fall needs xray, also due to worsened instability and multiple falls , re val through PT may need PWC Fall safety and reduction in the home is discussed  Anxiety and depression Controlled, no change in medication Improved  Essential hypertension Controlled, no change in medication DASH diet and commitment to daily physical activity for a minimum of  30 minutes discussed and encouraged, as a part of hypertension management. The importance of attaining a healthy weight is also discussed.   Diabetes mellitus with nephropathy Controlled, no change in medication Patient advised to reduce carb and sweets, commit to regular physical activity, take meds as prescribed, test blood as directed, and attempt to lose weight, to improve blood sugar control.   Hyperlipidemia with target LDL less than 100 Controlled, no change in  medication Hyperlipidemia:Low fat diet discussed and encouraged.    Seasonal allergies Uncontrolled, daily use of meds and saline flushes to be started

## 2013-07-01 ENCOUNTER — Other Ambulatory Visit: Payer: Self-pay | Admitting: Family Medicine

## 2013-07-06 ENCOUNTER — Encounter: Payer: Self-pay | Admitting: Gastroenterology

## 2013-07-14 ENCOUNTER — Ambulatory Visit: Payer: Medicare HMO | Admitting: Gastroenterology

## 2013-07-27 ENCOUNTER — Other Ambulatory Visit: Payer: Self-pay | Admitting: Family Medicine

## 2013-07-31 ENCOUNTER — Other Ambulatory Visit: Payer: Self-pay

## 2013-07-31 MED ORDER — OXYCODONE-ACETAMINOPHEN 10-325 MG PO TABS: ORAL_TABLET | ORAL | Status: DC

## 2013-08-12 ENCOUNTER — Telehealth: Payer: Self-pay | Admitting: *Deleted

## 2013-08-12 NOTE — Telephone Encounter (Signed)
Pt called wanting to see about a referral to a ENT. Please advise

## 2013-08-17 ENCOUNTER — Ambulatory Visit (HOSPITAL_COMMUNITY)
Admission: RE | Admit: 2013-08-17 | Discharge: 2013-08-17 | Disposition: A | Discharge status: Home / Self Care | Payer: Medicare HMO | Source: Ambulatory Visit | Attending: Family Medicine | Admitting: Family Medicine

## 2013-08-17 ENCOUNTER — Telehealth: Payer: Self-pay

## 2013-08-17 DIAGNOSIS — R2689 Other abnormalities of gait and mobility: Secondary | ICD-10-CM

## 2013-08-17 DIAGNOSIS — IMO0001 Reserved for inherently not codable concepts without codable children: Secondary | ICD-10-CM | POA: Diagnosis not present

## 2013-08-17 DIAGNOSIS — R29818 Other symptoms and signs involving the nervous system: Secondary | ICD-10-CM | POA: Insufficient documentation

## 2013-08-17 DIAGNOSIS — R29898 Other symptoms and signs involving the musculoskeletal system: Secondary | ICD-10-CM | POA: Insufficient documentation

## 2013-08-17 DIAGNOSIS — R296 Repeated falls: Secondary | ICD-10-CM

## 2013-08-17 NOTE — Telephone Encounter (Signed)
Need clarification pls request /print pT recommendations so I can respond appropriately, whatever they suggest is what I request

## 2013-08-17 NOTE — Evaluation (Signed)
Physical Therapy Evaluation  Patient Details  Name: Helem Reesor Kellis MRN: [id number] Date of Birth: 1929-10-09  Today's Date: 08/17/2013 Time: 0950-1020 PT Time Calculation (min): 30 min Charge: evaluation              Visit#: 1 of 12  Re-eval: 09/16/13 Assessment Diagnosis: unsteady gait  Authorization: Josephine Igo    Past Medical History:  Past Medical History  Diagnosis Date  . Atrial flutter febuary 2008       atrial flutter... DC Cardioversion...successful... holding sinus as of january 2011 coumadin therapy...discontinued...severe hematoma secondary to fall  . Hypertension   . Hyperlipidemia   . Hypercholesterolemia   . Mitral regurgitation 2010    mild ...echo...2010  . Obesity   . Sleep apnea   . Fatigue   . Depression with anxiety   . Visual loss     unspecified  . Basal cell carcinoma   . Hematochezia   . Hematoma     ...buttocks secondary to fall november 2009 with coumadin  coumadin stopped  . Diabetes mellitus     type 2  . Back pain     with radiculopathy  . Arthritis   . Osteoarthritis   . Acute cystitis   . GERD (gastroesophageal reflux disease)   . Diverticulosis of colon   . Constipation   . IBS (irritable bowel syndrome)   . Abdominal bloating   . Nausea   . Erosive esophagitis   . Aortic valve sclerosis     Ef 60 %...echo...july 2010   . Chronic granulomatous disease     right chest ... ct chest... novmber 2009  . Normal nuclear stress test     september ,2007...no ischemia  . Anticoagulant long-term use     Coumadin  stopped...severe hematoma  from fall  . Edema     Painful, April, 2012  . Ejection fraction     EF 60-65%, echo, April 13, 2010, normal RV function, mild mitral regurgitation  . Carotid artery disease     Doppler, [facility], February 21, 2012, less than 50% bilateral carotid stenoses   Past Surgical History:  Past Surgical History  Procedure Laterality Date  . Bilateral bunion removel  [id number]  . Bilaterial  knee replacement      2000,2001  . Tonsillectomy  1957  . Appendectomy  1948  . Dilation and curettage of uterus  1952 and 2001  . Abdominal hysterectomy  2001  . Cataract extraction  2001    right   . Skin cancer excision    . Cataract extraction      left 2005  . Basal cell carcinoma excision  09/2009    right forarm and back of neck and left side   . Colonoscopy   07/17/2006    SMO:LMBE canal hemorrhoids, diminutive rectal polyp status post cold biopsy.  Otherwise, normal rectum. Left sided and  transverse diverticula.  Diminutive polyp in the ascending colon, cold bx  . Esophagogastroduodenoscopy   07/17/2006    MLJ:QGBEEF 2 cm distal esophageal erosion consistent with erosive reflux esophagitis, otherwise normal    Subjective Symptoms/Limitations Symptoms: Ms. Tufte states that she has become more unsteady on her feet since her back pain has increased with pain going down her leg to her ankle.  She states the leg pain is constant.  She has fallen, the last time was approximately one month ago How long can you sit comfortably?: no problem How long can you stand comfortably?: less than five minutes  How long can you walk comfortably?: less than five minutes  Pain Assessment Currently in Pain?: Yes Pain Score: 7  Pain Location: Back Pain Orientation: Right;Left Pain Radiating Towards: Rt ankle  Pain Onset: More than a month ago Pain Frequency: Constant Effect of Pain on Daily Activities: increases   Precautions/Restrictions  Precautions Precautions: Fall  Balance Screening Balance Screen Has the patient fallen in the past 6 months: Yes How many times?: 3-5 Has the patient had a decrease in activity level because of a fear of falling? : Yes Is the patient reluctant to leave their home because of a fear of falling? : Yes    Sensation/Coordination/Flexibility/Functional Tests Flexibility 90/90: Positive  Assessment RLE Strength Right Hip Flexion: 2+/5 Right Hip  Extension: 2+/5 Right Hip ABduction: 2-/5 Right Knee Flexion: 3+/5 Right Knee Extension: 5/5 Right Ankle Dorsiflexion: 2+/5 (Pt wears an AFO on Rt she has not had CVA ) LLE Strength Left Hip Flexion: 2+/5 Left Hip Extension: 2+/5 Left Hip ABduction: 3-/5 Left Knee Flexion: 3+/5 Left Knee Extension: 5/5 Left Ankle Dorsiflexion: 3-/5  Exercise/Treatments Mobility/Balance  Ambulation/Gait Ambulation/Gait:  (ambulates with forward bent postion of 30 degrees, Rt LE ER) Berg Balance Test Sit to Stand: Able to stand  independently using hands Standing Unsupported: Able to stand 2 minutes with supervision Sitting with Back Unsupported but Feet Supported on Floor or Stool: Able to sit safely and securely 2 minutes Stand to Sit: Uses backs of legs against chair to control descent Transfers: Able to transfer safely, definite need of hands Standing Unsupported with Eyes Closed: Able to stand 10 seconds with supervision Standing Ubsupported with Feet Together: Needs help to attain position and unable to hold for 15 seconds From Standing, Reach Forward with Outstretched Arm: Can reach forward >5 cm safely (2") From Standing Position, Pick up Object from Floor: Unable to try/needs assist to keep balance From Standing Position, Turn to Look Behind Over each Shoulder: Turn sideways only but maintains balance Turn 360 Degrees: Needs assistance while turning Standing Unsupported, Alternately Place Feet on Step/Stool: Needs assistance to keep from falling or unable to try Standing Unsupported, One Foot in Front: Loses balance while stepping or standing Standing on One Leg: Unable to try or needs assist to prevent fall Total Score: 22   Exercises Seated Other Seated Knee Exercises: scapular retraction, glut/ab sets, ankle DF/PF x 10@ Supine Bridges: 5 reps Other Supine Knee Exercises: bent knee raise; clam x 5     Physical Therapy Assessment and Plan PT Assessment and Plan Clinical Impression  Statement: Ms. Tippy is an 80 yo female who was referred for evaluation of w/c or other appropriate measures.  Ms. Schnoebelen is able to walk back to the examination room with a rolling walker but states she has been falling on a frequent basis.  Pt states that she does not want a wheelchair she would prefer if she could work on walking safer.  Examination demonstrates decreased balance, and decreased LE strength.  Pt will benefit from skillled PT to improve her balance and strength and decrease her risk of falling  Pt will benefit from skilled therapeutic intervention in order to improve on the following deficits: Decreased activity tolerance;Decreased strength;Difficulty walking;Obesity;Decreased mobility;Decreased balance Rehab Potential: Good PT Frequency: Min 2X/week PT Duration: 6 weeks PT Treatment/Interventions: Gait training;Therapeutic exercise;Therapeutic activities;Balance training;Patient/family education PT Plan: Concentrate on balance and strengthening of B LE as well as posture to improve the safety of pt ambulation.    Goals Home Exercise Program Pt/caregiver  will Perform Home Exercise Program: For increased strengthening PT Short Term Goals Time to Complete Short Term Goals: 2 weeks PT Short Term Goal 1: Pt to have had no falls PT Short Term Goal 2: Pt to be ambulating with walker for at least five minute every day PT Short Term Goal 3: Pt to only have 20 degree forward bend when ambulating.  PT Short Term Goal 4: Berg score increased 5 pt to reduce risk of falling  PT Long Term Goals Time to Complete Long Term Goals: 4 weeks PT Long Term Goal 1: I in advance HEP PT Long Term Goal 2: Pt to be ambulating with a walker for at least 10 minutes a day  Long Term Goal 3: Pt to have a ten degree forward bend or less when ambulating with walker  Long Term Goal 4: Berg score increased by 10 to reduce risk of falling  PT Long Term Goal 5: Pt strength to be improved one grade to be able to  come sit to stand on one attempt   Problem List Patient Active Problem List   Diagnosis Date Noted  . Bilateral leg weakness 08/17/2013  . Poor balance 08/17/2013  . GERD with stricture 06/29/2013  . Seasonal allergies 06/29/2013  . Knee pain, left 05/27/2013  . Candidiasis of breast 04/09/2013  . Abnormal thyroid blood test 03/12/2013  . Carotid artery disease   . Multiple falls 02/15/2012  . Hand fracture 10/26/2011  . Routine general medical examination at a health care facility 09/30/2011  . Arthritis, shoulder region 05/02/2011  . Torn rotator cuff, left 05/02/2011  . Urinary incontinence 08/04/2010  . Ejection fraction   . Hyperlipidemia   . Aortic valve sclerosis   . Normal nuclear stress test   . ATRIAL FLUTTER 01/28/2009  . MITRAL REGURGITATION, 0 (MILD) 09/28/2008  . OBESITY, UNSPECIFIED 09/12/2008  . GERD 05/11/2008  . DIVERTICULOSIS, COLON 05/11/2008  . CARCINOMA, BASAL CELL 04/30/2008  . EROSIVE ESOPHAGITIS 04/30/2008  . IRRITABLE BOWEL SYNDROME 04/30/2008  . HEMATOCHEZIA, HX OF 04/30/2008  . BACK PAIN WITH RADICULOPATHY 11/04/2007  . Diabetes mellitus with nephropathy 03/11/2007  . ANXIETY 03/11/2007  . DEPRESSION 03/11/2007  . HYPERTENSION 03/11/2007  . OSTEOARTHRITIS 03/11/2007    PT Plan of Care PT Home Exercise Plan: given  GP Functional Assessment Tool Used: clinical judgement  Functional Limitation: Mobility: Walking and moving around Mobility: Walking and Moving Around Current Status (C1660): At least 80 percent but less than 100 percent impaired, limited or restricted Mobility: Walking and Moving Around Goal Status [id number]): At least 60 percent but less than 80 percent impaired, limited or restricted  RUSSELL,CINDY 08/17/2013, 1:46 PM  Physician Documentation Your signature is required to indicate approval of the treatment plan as stated above.  Please sign and either send electronically or make a copy of this report for your files and return  this physician signed original.   Please mark one 1.__approve of plan  2. ___approve of plan with the following conditions.   ______________________________                                                          _____________________ Physician Signature  Date  

## 2013-08-17 NOTE — Telephone Encounter (Signed)
Noted and left in box for review.

## 2013-08-17 NOTE — Telephone Encounter (Signed)
Please advise 

## 2013-08-18 ENCOUNTER — Ambulatory Visit (INDEPENDENT_AMBULATORY_CARE_PROVIDER_SITE_OTHER): Payer: Commercial Managed Care - HMO | Admitting: Gastroenterology

## 2013-08-18 ENCOUNTER — Encounter (INDEPENDENT_AMBULATORY_CARE_PROVIDER_SITE_OTHER): Payer: Self-pay

## 2013-08-18 ENCOUNTER — Encounter: Payer: Self-pay | Admitting: Gastroenterology

## 2013-08-18 ENCOUNTER — Telehealth: Payer: Self-pay | Admitting: Family Medicine

## 2013-08-18 VITALS — BP 118/62 | HR 65 | Temp 97.9°F | Ht 62.0 in | Wt 167.0 lb

## 2013-08-18 DIAGNOSIS — R42 Dizziness and giddiness: Secondary | ICD-10-CM

## 2013-08-18 DIAGNOSIS — R1310 Dysphagia, unspecified: Secondary | ICD-10-CM | POA: Insufficient documentation

## 2013-08-18 NOTE — Patient Instructions (Addendum)
We have scheduled you for an upper endoscopy with dilation with Dr. Gala Romney in the near future.   Take 1/2 dose of Lantus the evening prior. Do not take any diabetes medication the day of the procedure.

## 2013-08-18 NOTE — Progress Notes (Signed)
Primary Care Physician:  Tula Nakayama, MD Primary Gastroenterologist:  Dr. Gala Romney   Chief Complaint  Patient presents with  . Gastrophageal Reflux    HPI:   [name] [name] is an 80 year old female who presents today at the request of Dr. Moshe Cipro secondary to dysphagia. Notes early satiety. Notes esophageal dysphagia. Food gets hung up. Happens with majority of food. Even liquids feel like she has to push to get it down. No odynophagia. Occasional belching, indigestion. Decreased portion sizes, doesn't "eat as much as I used to". 15 lbs weight loss per patient over the past 6 months or so. Taking Nexium instead of Omeprazole, which seems to be doing better. On for at least 6 weeks. No melena or hematochezia. No changes in bowel habits. Takes stool softener sometimes daily to few times a week as needed.   History of erosive reflux esophagitis in 2008. Last colonoscopy in 2008 with hyperplastic polyps.   Past Medical History  Diagnosis Date  . Atrial flutter febuary 2008       atrial flutter... DC Cardioversion...successful... holding sinus as of january 2011 coumadin therapy...discontinued...severe hematoma secondary to fall  . Hypertension   . Hyperlipidemia   . Hypercholesterolemia   . Mitral regurgitation 2010    mild ...echo...2010  . Obesity   . Sleep apnea   . Fatigue   . Depression with anxiety   . Visual loss     unspecified  . Basal cell carcinoma   . Hematochezia   . Hematoma     ...buttocks secondary to fall november 2009 with coumadin  coumadin stopped  . Diabetes mellitus     type 2  . Back pain     with radiculopathy  . Arthritis   . Osteoarthritis   . Acute cystitis   . GERD (gastroesophageal reflux disease)   . Diverticulosis of colon   . Constipation   . IBS (irritable bowel syndrome)   . Abdominal bloating   . Nausea   . Erosive esophagitis   . Aortic valve sclerosis     Ef 60 %...echo...july 2010   . Chronic granulomatous disease     right  chest ... ct chest... novmber 2009  . Normal nuclear stress test     september ,2007...no ischemia  . Anticoagulant long-term use     Coumadin  stopped...severe hematoma  from fall  . Edema     Painful, April, 2012  . Ejection fraction     EF 60-65%, echo, April 13, 2010, normal RV function, mild mitral regurgitation  . Carotid artery disease     Doppler, [facility], February 21, 2012, less than 50% bilateral carotid stenoses    Past Surgical History  Procedure Laterality Date  . Bilateral bunion removel  [id number]  . Replacement total knee bilateral      2000,2001  . Tonsillectomy  1957  . Appendectomy  1948  . Dilation and curettage of uterus  1952 and 2001  . Abdominal hysterectomy  2001  . Cataract extraction  2001    right   . Skin cancer excision    . Cataract extraction      left 2005  . Basal cell carcinoma excision  09/2009    right forarm and back of neck and left side   . Colonoscopy   07/17/2006    Dr. Rourk:anal canal hemorrhoids, diminutive rectal polyp status post cold biopsy.  Otherwise, normal rectum. Left sided and  transverse diverticula.  Diminutive polyp in the  ascending colon, cold bx. hyperplastic polyps.   . Esophagogastroduodenoscopy   07/17/2006    Dr. Rourk:single 2 cm distal esophageal erosion consistent with erosive reflux esophagitis, otherwise normal    Current Outpatient Prescriptions  Medication Sig Dispense Refill  . alendronate (FOSAMAX) 70 MG tablet TAKE 1 TABLET BY MOUTH WITH A FULL GLASS OF WATER ON AN EMPTY STOMACH EVERY 7 DAYS  4 tablet  11  . aspirin 81 MG tablet Take 81 mg by mouth daily.        . benazepril-hydrochlorthiazide (LOTENSIN HCT) 20-12.5 MG per tablet TAKE 2 TABLETS BY MOUTH DAILY.  180 tablet  1  . citalopram (CELEXA) 40 MG tablet TAKE 1 TABLET BY MOUTH EVERY DAY  30 tablet  5  . CRESTOR 40 MG tablet TAKE 1 TABLET BY MOUTH EVERY DAY  30 tablet  2  . diltiazem (DILT-XR) 240 MG 24 hr capsule TAKE 1 CAPSULE BY MOUTH  DAILY.  90 capsule  1  . esomeprazole (NEXIUM) 40 MG capsule Take 40 mg by mouth daily at 12 noon.      . fenofibrate (TRICOR) 145 MG tablet TAKE 1 TABLET BY MOUTH AT BEDTIME  30 tablet  5  . fesoterodine (TOVIAZ) 8 MG TB24 tablet Take 1 tablet (8 mg total) by mouth daily.  30 tablet  5  . fish oil-omega-3 fatty acids 1000 MG capsule Take 1 g by mouth daily. 2 caps bid       . fluticasone (FLONASE) 50 MCG/ACT nasal spray Place 2 sprays into both nostrils daily.  16 g  6  . furosemide (LASIX) 20 MG tablet Take 20 mg by mouth.      . gabapentin (NEURONTIN) 100 MG capsule Take 1 capsule (100 mg total) by mouth 3 (three) times daily.  90 capsule  3  . GLIPIZIDE XL 10 MG 24 hr tablet Take 10 mg by mouth daily.      Marland Kitchen glucose blood (ACCU-CHEK AVIVA PLUS) test strip For three times daily testing dx 250.40  100 each  11  . Insulin Glargine (LANTUS) 100 UNIT/ML Solostar Pen Inject 25 Units into the skin daily.  15 mL  5  . L-Methylfolate-Algae-B12-B6 (METANX) 3-90.314-2-35 MG CAPS TAKE 1 CAPSULE BY MOUTH DAILY  30 capsule  3  . metFORMIN (GLUCOPHAGE) 1000 MG tablet Take 1 tablet (1,000 mg total) by mouth 2 (two) times daily with a meal.  180 tablet  3  . nystatin (MYCOSTATIN/NYSTOP) 100000 UNIT/GM POWD Apply to affected area(s) twice daily for 1 week then as needed  60 g  2  . oxyCODONE-acetaminophen (PERCOCET) 10-325 MG per tablet One tablet four times daily for severe uncontrolled pain  120 tablet  0  . [DISCONTINUED] solifenacin (VESICARE) 10 MG tablet Take 1 tablet (10 mg total) by mouth daily.  30 tablet  5   No current facility-administered medications for this visit.    Allergies as of 08/18/2013 - Review Complete 06/29/2013  Allergen Reaction Noted  . Lisinopril Nausea And Vomiting   . Losartan  04/08/2013  . Alprazolam Other (See Comments)   . Sertraline hcl Diarrhea 05/24/2008  . Sulfonamide derivatives Rash     Family History  Problem Relation Age of Onset  . Dementia Mother   .  Heart disease Father   . Diabetes    . Arthritis    . Colon cancer Neg Hx     History   Social History  . Marital Status: Married    Spouse Name: N/A  Number of Children: N/A  . Years of Education: 13   Occupational History  . retired    Social History Main Topics  . Smoking status: Never Smoker   . Smokeless tobacco: Never Used  . Alcohol Use: No  . Drug Use: No  . Sexual Activity: Not on file   Other Topics Concern  . Not on file   Social History Narrative  . No narrative on file    Review of Systems: Gen: see HPI  CV: Denies chest pain, heart palpitations, peripheral edema, syncope.  Resp: Denies shortness of breath at rest or with exertion. Denies wheezing or cough.  GI: see HPI GU : +urinary frequency X 1 year MS: +arthritis Derm: Denies rash, itching, dry skin Psych: Denies depression, anxiety, memory loss, and confusion   Physical Exam: BP 118/62  Pulse 65  Temp(Src) 97.9 F (36.6 C) (Oral)  Ht 5\' 2"  (1.575 m)  Wt 167 lb (75.751 kg)  BMI 30.54 kg/m2 General:   Alert and oriented. Pleasant and cooperative. Well-nourished and well-developed.  Head:  Normocephalic and atraumatic. Eyes:  Without icterus, sclera clear and conjunctiva pink.  Ears:  Normal auditory acuity. Nose:  No deformity, discharge,  or lesions. Mouth:  No deformity or lesions, oral mucosa pink.  Lungs:  Clear to auscultation bilaterally. No wheezes, rales, or rhonchi. No distress.  Heart:  S1, S2 present with questionable systolic murmur noted Abdomen:  +BS, soft, non-tender and non-distended. Query ventral hernia Rectal:  Deferred  Msk:  Symmetrical without gross deformities. Normal posture. Extremities:  Without clubbing or edema. Neurologic:  Alert and  oriented x4;  grossly normal neurologically. Skin:  Intact without significant lesions or rashes. Psych:  Alert and cooperative. Normal mood and affect.

## 2013-08-20 NOTE — Telephone Encounter (Signed)
thanks

## 2013-08-20 NOTE — Telephone Encounter (Signed)
Spoke with patient and she is requesting referral due to vertigo on recommendation from GI.  Referral entered.

## 2013-08-21 ENCOUNTER — Other Ambulatory Visit: Payer: Self-pay | Admitting: Family Medicine

## 2013-08-21 ENCOUNTER — Other Ambulatory Visit: Payer: Self-pay

## 2013-08-21 ENCOUNTER — Encounter: Payer: Self-pay | Admitting: Gastroenterology

## 2013-08-21 DIAGNOSIS — R42 Dizziness and giddiness: Secondary | ICD-10-CM

## 2013-08-21 DIAGNOSIS — J029 Acute pharyngitis, unspecified: Secondary | ICD-10-CM

## 2013-08-21 NOTE — Assessment & Plan Note (Signed)
80 year old female with new onset dysphagia, early satiety, and weight loss of approximately 15 lbs per patient over the past 6 months. Nexium seems to have improved symptoms over omeprazole but still with persistent issues. As of note, history of erosive reflux esophagitis in 2008. Last colonoscopy in 2008 with hyperplastic polyps. Differentials include severe esophagitis, web, ring, stricture, motility disorder, less likely occult malignancy.   Proceed with upper endoscopy and dilation in the near future with Dr. Gala Romney. The risks, benefits, and alternatives have been discussed in detail with patient. They have stated understanding and desire to proceed.  Continue Nexium for now.

## 2013-08-24 ENCOUNTER — Encounter (HOSPITAL_COMMUNITY): Payer: Self-pay | Admitting: Pharmacy Technician

## 2013-08-24 NOTE — Telephone Encounter (Signed)
Appointment with Dr Benjamine Mola 10.1.15 patient is aware at 1:00

## 2013-08-26 ENCOUNTER — Telehealth (HOSPITAL_COMMUNITY): Payer: Self-pay

## 2013-08-26 ENCOUNTER — Ambulatory Visit (HOSPITAL_COMMUNITY): Payer: Medicare HMO

## 2013-08-26 NOTE — Progress Notes (Signed)
cc'd to pcp 

## 2013-08-28 ENCOUNTER — Other Ambulatory Visit: Payer: Self-pay

## 2013-08-28 MED ORDER — OXYCODONE-ACETAMINOPHEN 10-325 MG PO TABS: ORAL_TABLET | ORAL | Status: DC

## 2013-08-31 ENCOUNTER — Ambulatory Visit (HOSPITAL_COMMUNITY)
Admission: RE | Admit: 2013-08-31 | Discharge: 2013-08-31 | Disposition: A | Discharge status: Home / Self Care | Payer: Medicare HMO | Source: Ambulatory Visit | Attending: Family Medicine | Admitting: Family Medicine

## 2013-08-31 NOTE — Progress Notes (Signed)
  Patient Details  Name: [name] [name] MRN: [id number] Date of Birth: December 29, 1929  Today's Date: 08/31/2013 Time:13:50  Pt came into therapy, however c/o nausea and general malaise.  Pt requested therapy be held until after her EGD on 09/10/13.  Discussed with patient and caregiver and felt this was in her best interest as she has missed 1 week and having difficulty completing therapy due to stomach issues.  PT reports she is independent with HEP given at initial evaluation.  Pt made return appointment on 09/14/13 at which time she will need to be reassessed.   Teena Irani, PTA/CLT 08/31/2013, 1:59 PM

## 2013-09-02 ENCOUNTER — Ambulatory Visit (HOSPITAL_COMMUNITY): Payer: Medicare HMO | Admitting: Physical Therapy

## 2013-09-08 ENCOUNTER — Encounter: Payer: Self-pay | Admitting: Cardiology

## 2013-09-09 ENCOUNTER — Telehealth: Payer: Self-pay | Admitting: Family Medicine

## 2013-09-09 ENCOUNTER — Ambulatory Visit (HOSPITAL_COMMUNITY): Payer: Medicare HMO | Admitting: Physical Therapy

## 2013-09-09 NOTE — Telephone Encounter (Signed)
Noted  

## 2013-09-10 ENCOUNTER — Ambulatory Visit (HOSPITAL_COMMUNITY)
Admission: RE | Admit: 2013-09-10 | Discharge: 2013-09-10 | Disposition: A | Discharge status: Home / Self Care | Payer: Medicare HMO | Source: Ambulatory Visit | Attending: Internal Medicine | Admitting: Internal Medicine

## 2013-09-10 ENCOUNTER — Encounter (HOSPITAL_COMMUNITY)
Admission: RE | Disposition: A | Discharge status: Home / Self Care | Payer: Self-pay | Source: Ambulatory Visit | Attending: Internal Medicine

## 2013-09-10 ENCOUNTER — Encounter: Payer: Self-pay | Admitting: Internal Medicine

## 2013-09-10 ENCOUNTER — Encounter (HOSPITAL_COMMUNITY): Payer: Self-pay | Admitting: *Deleted

## 2013-09-10 DIAGNOSIS — R1310 Dysphagia, unspecified: Secondary | ICD-10-CM | POA: Insufficient documentation

## 2013-09-10 DIAGNOSIS — K228 Other specified diseases of esophagus: Secondary | ICD-10-CM | POA: Insufficient documentation

## 2013-09-10 DIAGNOSIS — K2289 Other specified disease of esophagus: Secondary | ICD-10-CM | POA: Diagnosis not present

## 2013-09-10 DIAGNOSIS — K222 Esophageal obstruction: Secondary | ICD-10-CM

## 2013-09-10 DIAGNOSIS — K449 Diaphragmatic hernia without obstruction or gangrene: Secondary | ICD-10-CM | POA: Insufficient documentation

## 2013-09-10 DIAGNOSIS — R634 Abnormal weight loss: Secondary | ICD-10-CM | POA: Insufficient documentation

## 2013-09-10 DIAGNOSIS — Q393 Congenital stenosis and stricture of esophagus: Principal | ICD-10-CM

## 2013-09-10 DIAGNOSIS — R6881 Early satiety: Secondary | ICD-10-CM | POA: Insufficient documentation

## 2013-09-10 DIAGNOSIS — Q391 Atresia of esophagus with tracheo-esophageal fistula: Secondary | ICD-10-CM | POA: Insufficient documentation

## 2013-09-10 HISTORY — PX: MALONEY DILATION: SHX5535

## 2013-09-10 HISTORY — PX: SAVORY DILATION: SHX5439

## 2013-09-10 HISTORY — PX: ESOPHAGOGASTRODUODENOSCOPY: SHX5428

## 2013-09-10 LAB — GLUCOSE, CAPILLARY: Glucose-Capillary: 125 mg/dL — ABNORMAL HIGH (ref 70–99)

## 2013-09-10 SURGERY — EGD (ESOPHAGOGASTRODUODENOSCOPY)
Anesthesia: Moderate Sedation

## 2013-09-10 MED ORDER — ONDANSETRON HCL 4 MG/2ML IJ SOLN
INTRAMUSCULAR | Status: AC
  Filled 2013-09-10: qty 2

## 2013-09-10 MED ORDER — ONDANSETRON HCL 4 MG/2ML IJ SOLN
INTRAMUSCULAR | Status: DC | PRN
  Administered 2013-09-10: 4 mg via INTRAVENOUS

## 2013-09-10 MED ORDER — MEPERIDINE HCL 100 MG/ML IJ SOLN
INTRAMUSCULAR | Status: DC | PRN
  Administered 2013-09-10: 25 mg via INTRAVENOUS

## 2013-09-10 MED ORDER — MEPERIDINE HCL 100 MG/ML IJ SOLN
INTRAMUSCULAR | Status: AC
  Filled 2013-09-10: qty 2

## 2013-09-10 MED ORDER — LIDOCAINE VISCOUS 2 % MT SOLN
OROMUCOSAL | Status: DC | PRN
  Administered 2013-09-10: 3 mL via OROMUCOSAL

## 2013-09-10 MED ORDER — SODIUM CHLORIDE 0.9 % IV SOLN
INTRAVENOUS | Status: DC
  Administered 2013-09-10: 10:00:00 via INTRAVENOUS

## 2013-09-10 MED ORDER — MIDAZOLAM HCL 5 MG/5ML IJ SOLN
INTRAMUSCULAR | Status: AC
  Filled 2013-09-10: qty 10

## 2013-09-10 MED ORDER — LIDOCAINE VISCOUS 2 % MT SOLN
OROMUCOSAL | Status: AC
  Filled 2013-09-10: qty 15

## 2013-09-10 MED ORDER — MIDAZOLAM HCL 5 MG/5ML IJ SOLN
INTRAMUSCULAR | Status: DC | PRN
  Administered 2013-09-10: 2 mg via INTRAVENOUS

## 2013-09-10 MED ORDER — STERILE WATER FOR IRRIGATION IR SOLN
Status: DC | PRN
  Administered 2013-09-10: 11:00:00

## 2013-09-10 NOTE — Op Note (Signed)
[facility] [address] [place], [address]   ENDOSCOPY PROCEDURE REPORT  PATIENT: [name], [name] Ramser  MR#: [id number] BIRTHDATE: 26-Aug-1929 , 84  yrs. old GENDER: Female ENDOSCOPIST: R.  Garfield Cornea, MD FACP FACG REFERRED BY:  Tula Nakayama, M.D. PROCEDURE DATE:  09/10/2013 PROCEDURE:     EGD with Venia Minks dilation  INDICATIONS:     Esophageal dysphagia; early satiety; weight loss  INFORMED CONSENT:   The risks, benefits, limitations, alternatives and imponderables have been discussed.  The potential for biopsy, esophogeal dilation, etc. have also been reviewed.  Questions have been answered.  All parties agreeable.  Please see the history and physical in the medical record for more information.  MEDICATIONS:       Versed 2 mg IV and  Demerol  25 mg IV; Xylocaine gel orally. Zofran 4 mg IV  DESCRIPTION OF PROCEDURE:   The [id number] ([id number])  endoscope was introduced through the mouth and advanced to the second portion of the duodenum without difficulty or limitations.  The mucosal surfaces were surveyed very carefully during advancement of the scope and upon withdrawal.  Retroflexion view of the proximal stomach and esophagogastric junction was performed.      FINDINGS:    Single distal esophageal erosion. Noncritical Schatzki's ring. Capsule lodged in hernia sac. Otherwise, stomach empty. Moderate size hiatal hernia. Normal gastric mucosa. Patent pylorus. Normal first and second portion of the duodenum.  THERAPEUTIC / DIAGNOSTIC MANEUVERS PERFORMED:  A 54 French Maloney dilator was passed to full insertion easily. A look back revealed small mucosal break in the upper esophageal sphincter mucosa only; no apparent complication related to this maneuver.   COMPLICATIONS:  None  IMPRESSION:   Schatzki's ring - status post Maloney dilation. Mild erosive reflux esophagitis. Hiatal hernia with capsule lodged as described above.  RECOMMENDATIONS:    Patient should stop Fosamax; Utilization of a parenteral bisphosphonate recommended. Continue Nexium 40 mg daily. Office followup with [place] in 2 months.    _______________________________ R. Garfield Cornea, MD FACP [facility] eSigned:  R. Garfield Cornea, MD FACP [facility] 09/10/2013 11:30 AM     CC:

## 2013-09-10 NOTE — Interval H&P Note (Signed)
History and Physical Interval Note:  09/10/2013 10:56 AM  [name] [name]  has presented today for surgery, with the diagnosis of DYSPHAGIA  The various methods of treatment have been discussed with the patient and family. After consideration of risks, benefits and other options for treatment, the patient has consented to  Procedure(s) with comments: ESOPHAGOGASTRODUODENOSCOPY (EGD) (N/A) - 8:00-moved to [place] to notify pt SAVORY DILATION (N/A) MALONEY DILATION (N/A) as a surgical intervention .  The patient's history has been reviewed, patient examined, no change in status, stable for surgery.  I have reviewed the patient's chart and labs.  Questions were answered to the patient's satisfaction.     Robert Rourk  No change. EGD with possible esophageal dilation per plan.

## 2013-09-10 NOTE — H&P (View-Only) (Signed)
Primary Care Physician:  Tula Nakayama, MD Primary Gastroenterologist:  Dr. Gala Romney   Chief Complaint  Patient presents with  . Gastrophageal Reflux    HPI:   Renda Pohlman Doring is an 80 year old female who presents today at the request of Dr. Moshe Cipro secondary to dysphagia. Notes early satiety. Notes esophageal dysphagia. Food gets hung up. Happens with majority of food. Even liquids feel like she has to push to get it down. No odynophagia. Occasional belching, indigestion. Decreased portion sizes, doesn't "eat as much as I used to". 15 lbs weight loss per patient over the past 6 months or so. Taking Nexium instead of Omeprazole, which seems to be doing better. On for at least 6 weeks. No melena or hematochezia. No changes in bowel habits. Takes stool softener sometimes daily to few times a week as needed.   History of erosive reflux esophagitis in 2008. Last colonoscopy in 2008 with hyperplastic polyps.   Past Medical History  Diagnosis Date  . Atrial flutter febuary 2008       atrial flutter... DC Cardioversion...successful... holding sinus as of january 2011 coumadin therapy...discontinued...severe hematoma secondary to fall  . Hypertension   . Hyperlipidemia   . Hypercholesterolemia   . Mitral regurgitation 2010    mild ...echo...2010  . Obesity   . Sleep apnea   . Fatigue   . Depression with anxiety   . Visual loss     unspecified  . Basal cell carcinoma   . Hematochezia   . Hematoma     ...buttocks secondary to fall november 2009 with coumadin  coumadin stopped  . Diabetes mellitus     type 2  . Back pain     with radiculopathy  . Arthritis   . Osteoarthritis   . Acute cystitis   . GERD (gastroesophageal reflux disease)   . Diverticulosis of colon   . Constipation   . IBS (irritable bowel syndrome)   . Abdominal bloating   . Nausea   . Erosive esophagitis   . Aortic valve sclerosis     Ef 60 %...echo...july 2010   . Chronic granulomatous disease     right  chest ... ct chest... novmber 2009  . Normal nuclear stress test     september ,2007...no ischemia  . Anticoagulant long-term use     Coumadin  stopped...severe hematoma  from fall  . Edema     Painful, April, 2012  . Ejection fraction     EF 60-65%, echo, April 13, 2010, normal RV function, mild mitral regurgitation  . Carotid artery disease     Doppler, [facility], February 21, 2012, less than 50% bilateral carotid stenoses    Past Surgical History  Procedure Laterality Date  . Bilateral bunion removel  [id number]  . Replacement total knee bilateral      2000,2001  . Tonsillectomy  1957  . Appendectomy  1948  . Dilation and curettage of uterus  1952 and 2001  . Abdominal hysterectomy  2001  . Cataract extraction  2001    right   . Skin cancer excision    . Cataract extraction      left 2005  . Basal cell carcinoma excision  09/2009    right forarm and back of neck and left side   . Colonoscopy   07/17/2006    Dr. Rourk:anal canal hemorrhoids, diminutive rectal polyp status post cold biopsy.  Otherwise, normal rectum. Left sided and  transverse diverticula.  Diminutive polyp in the  ascending colon, cold bx. hyperplastic polyps.   . Esophagogastroduodenoscopy   07/17/2006    Dr. Rourk:single 2 cm distal esophageal erosion consistent with erosive reflux esophagitis, otherwise normal    Current Outpatient Prescriptions  Medication Sig Dispense Refill  . alendronate (FOSAMAX) 70 MG tablet TAKE 1 TABLET BY MOUTH WITH A FULL GLASS OF WATER ON AN EMPTY STOMACH EVERY 7 DAYS  4 tablet  11  . aspirin 81 MG tablet Take 81 mg by mouth daily.        . benazepril-hydrochlorthiazide (LOTENSIN HCT) 20-12.5 MG per tablet TAKE 2 TABLETS BY MOUTH DAILY.  180 tablet  1  . citalopram (CELEXA) 40 MG tablet TAKE 1 TABLET BY MOUTH EVERY DAY  30 tablet  5  . CRESTOR 40 MG tablet TAKE 1 TABLET BY MOUTH EVERY DAY  30 tablet  2  . diltiazem (DILT-XR) 240 MG 24 hr capsule TAKE 1 CAPSULE BY MOUTH  DAILY.  90 capsule  1  . esomeprazole (NEXIUM) 40 MG capsule Take 40 mg by mouth daily at 12 noon.      . fenofibrate (TRICOR) 145 MG tablet TAKE 1 TABLET BY MOUTH AT BEDTIME  30 tablet  5  . fesoterodine (TOVIAZ) 8 MG TB24 tablet Take 1 tablet (8 mg total) by mouth daily.  30 tablet  5  . fish oil-omega-3 fatty acids 1000 MG capsule Take 1 g by mouth daily. 2 caps bid       . fluticasone (FLONASE) 50 MCG/ACT nasal spray Place 2 sprays into both nostrils daily.  16 g  6  . furosemide (LASIX) 20 MG tablet Take 20 mg by mouth.      . gabapentin (NEURONTIN) 100 MG capsule Take 1 capsule (100 mg total) by mouth 3 (three) times daily.  90 capsule  3  . GLIPIZIDE XL 10 MG 24 hr tablet Take 10 mg by mouth daily.      Marland Kitchen glucose blood (ACCU-CHEK AVIVA PLUS) test strip For three times daily testing dx 250.40  100 each  11  . Insulin Glargine (LANTUS) 100 UNIT/ML Solostar Pen Inject 25 Units into the skin daily.  15 mL  5  . L-Methylfolate-Algae-B12-B6 (METANX) 3-90.314-2-35 MG CAPS TAKE 1 CAPSULE BY MOUTH DAILY  30 capsule  3  . metFORMIN (GLUCOPHAGE) 1000 MG tablet Take 1 tablet (1,000 mg total) by mouth 2 (two) times daily with a meal.  180 tablet  3  . nystatin (MYCOSTATIN/NYSTOP) 100000 UNIT/GM POWD Apply to affected area(s) twice daily for 1 week then as needed  60 g  2  . oxyCODONE-acetaminophen (PERCOCET) 10-325 MG per tablet One tablet four times daily for severe uncontrolled pain  120 tablet  0  . [DISCONTINUED] solifenacin (VESICARE) 10 MG tablet Take 1 tablet (10 mg total) by mouth daily.  30 tablet  5   No current facility-administered medications for this visit.    Allergies as of 08/18/2013 - Review Complete 06/29/2013  Allergen Reaction Noted  . Lisinopril Nausea And Vomiting   . Losartan  04/08/2013  . Alprazolam Other (See Comments)   . Sertraline hcl Diarrhea 05/24/2008  . Sulfonamide derivatives Rash     Family History  Problem Relation Age of Onset  . Dementia Mother   .  Heart disease Father   . Diabetes    . Arthritis    . Colon cancer Neg Hx     History   Social History  . Marital Status: Married    Spouse Name: N/A  Number of Children: N/A  . Years of Education: 13   Occupational History  . retired    Social History Main Topics  . Smoking status: Never Smoker   . Smokeless tobacco: Never Used  . Alcohol Use: No  . Drug Use: No  . Sexual Activity: Not on file   Other Topics Concern  . Not on file   Social History Narrative  . No narrative on file    Review of Systems: Gen: see HPI  CV: Denies chest pain, heart palpitations, peripheral edema, syncope.  Resp: Denies shortness of breath at rest or with exertion. Denies wheezing or cough.  GI: see HPI GU : +urinary frequency X 1 year MS: +arthritis Derm: Denies rash, itching, dry skin Psych: Denies depression, anxiety, memory loss, and confusion   Physical Exam: BP 118/62  Pulse 65  Temp(Src) 97.9 F (36.6 C) (Oral)  Ht 5\' 2"  (1.575 m)  Wt 167 lb (75.751 kg)  BMI 30.54 kg/m2 General:   Alert and oriented. Pleasant and cooperative. Well-nourished and well-developed.  Head:  Normocephalic and atraumatic. Eyes:  Without icterus, sclera clear and conjunctiva pink.  Ears:  Normal auditory acuity. Nose:  No deformity, discharge,  or lesions. Mouth:  No deformity or lesions, oral mucosa pink.  Lungs:  Clear to auscultation bilaterally. No wheezes, rales, or rhonchi. No distress.  Heart:  S1, S2 present with questionable systolic murmur noted Abdomen:  +BS, soft, non-tender and non-distended. Query ventral hernia Rectal:  Deferred  Msk:  Symmetrical without gross deformities. Normal posture. Extremities:  Without clubbing or edema. Neurologic:  Alert and  oriented x4;  grossly normal neurologically. Skin:  Intact without significant lesions or rashes. Psych:  Alert and cooperative. Normal mood and affect.

## 2013-09-10 NOTE — Discharge Instructions (Addendum)
EGD Discharge instructions Please read the instructions outlined below and refer to this sheet in the next few weeks. These discharge instructions provide you with general information on caring for yourself after you leave the hospital. Your doctor may also give you specific instructions. While your treatment has been planned according to the most current medical practices available, unavoidable complications occasionally occur. If you have any problems or questions after discharge, please call your doctor. ACTIVITY  You may resume your regular activity but move at a slower pace for the next 24 hours.   Take frequent rest periods for the next 24 hours.   Walking will help expel (get rid of) the air and reduce the bloated feeling in your abdomen.   No driving for 24 hours (because of the anesthesia (medicine) used during the test).   You may shower.   Do not sign any important legal documents or operate any machinery for 24 hours (because of the anesthesia used during the test).  NUTRITION  Drink plenty of fluids.   You may resume your normal diet.   Begin with a light meal and progress to your normal diet.   Avoid alcoholic beverages for 24 hours or as instructed by your caregiver.  MEDICATIONS  You may resume your normal medications unless your caregiver tells you otherwise.  WHAT YOU CAN EXPECT TODAY  You may experience abdominal discomfort such as a feeling of fullness or gas pains.  FOLLOW-UP  Your doctor will discuss the results of your test with you.  SEEK IMMEDIATE MEDICAL ATTENTION IF ANY OF THE FOLLOWING OCCUR:  Excessive nausea (feeling sick to your stomach) and/or vomiting.   Severe abdominal pain and distention (swelling).   Trouble swallowing.   Temperature over 101 F (37.8 C).   Rectal bleeding or vomiting of blood.    Continue Nexium 40 mg daily  Stop Fosamax; need to see Dr. Moshe Cipro regarding an alternative medication for osteoporosis which you do  not have to swallow  Office visit with [place] in 2 months.  GERD information provided    Gastroesophageal Reflux Disease, Adult Gastroesophageal reflux disease (GERD) happens when acid from your stomach flows up into the esophagus. When acid comes in contact with the esophagus, the acid causes soreness (inflammation) in the esophagus. Over time, GERD may create small holes (ulcers) in the lining of the esophagus. CAUSES  Increased body weight. This puts pressure on the stomach, making acid rise from the stomach into the esophagus. Smoking. This increases acid production in the stomach. Drinking alcohol. This causes decreased pressure in the lower esophageal sphincter (valve or ring of muscle between the esophagus and stomach), allowing acid from the stomach into the esophagus. Late evening meals and a full stomach. This increases pressure and acid production in the stomach. A malformed lower esophageal sphincter. Sometimes, no cause is found. SYMPTOMS  Burning pain in the lower part of the mid-chest behind the breastbone and in the mid-stomach area. This may occur twice a week or more often. Trouble swallowing. Sore throat. Dry cough. Asthma-like symptoms including chest tightness, shortness of breath, or wheezing. DIAGNOSIS  Your caregiver may be able to diagnose GERD based on your symptoms. In some cases, X-rays and other tests may be done to check for complications or to check the condition of your stomach and esophagus. TREATMENT  Your caregiver may recommend over-the-counter or prescription medicines to help decrease acid production. Ask your caregiver before starting or adding any new medicines.  HOME CARE INSTRUCTIONS  Change  the factors that you can control. Ask your caregiver for guidance concerning weight loss, quitting smoking, and alcohol consumption. Avoid foods and drinks that make your symptoms worse, such as: Caffeine or alcoholic drinks. Chocolate. Peppermint or mint  flavorings. Garlic and onions. Spicy foods. Citrus fruits, such as oranges, lemons, or limes. Tomato-based foods such as sauce, chili, salsa, and pizza. Fried and fatty foods. Avoid lying down for the 3 hours prior to your bedtime or prior to taking a nap. Eat small, frequent meals instead of large meals. Wear loose-fitting clothing. Do not wear anything tight around your waist that causes pressure on your stomach. Raise the head of your bed 6 to 8 inches with wood blocks to help you sleep. Extra pillows will not help. Only take over-the-counter or prescription medicines for pain, discomfort, or fever as directed by your caregiver. Do not take aspirin, ibuprofen, or other nonsteroidal anti-inflammatory drugs (NSAIDs). SEEK IMMEDIATE MEDICAL CARE IF:  You have pain in your arms, neck, jaw, teeth, or back. Your pain increases or changes in intensity or duration. You develop nausea, vomiting, or sweating (diaphoresis). You develop shortness of breath, or you faint. Your vomit is green, yellow, black, or looks like coffee grounds or blood. Your stool is red, bloody, or black. These symptoms could be signs of other problems, such as heart disease, gastric bleeding, or esophageal bleeding. MAKE SURE YOU:  Understand these instructions. Will watch your condition. Will get help right away if you are not doing well or get worse. Document Released: 09/27/2004 Document Revised: 03/12/2011 Document Reviewed: 07/07/2010 [facility] Patient Information 2015 [place], [place]. This information is not intended to replace advice given to you by your health care provider. Make sure you discuss any questions you have with your health care provider.

## 2013-09-11 ENCOUNTER — Encounter: Payer: Self-pay | Admitting: Cardiology

## 2013-09-11 ENCOUNTER — Ambulatory Visit (INDEPENDENT_AMBULATORY_CARE_PROVIDER_SITE_OTHER): Payer: Commercial Managed Care - HMO | Admitting: Cardiology

## 2013-09-11 VITALS — BP 121/70 | HR 60 | Ht 62.0 in | Wt 168.0 lb

## 2013-09-11 DIAGNOSIS — I10 Essential (primary) hypertension: Secondary | ICD-10-CM | POA: Diagnosis not present

## 2013-09-11 DIAGNOSIS — I359 Nonrheumatic aortic valve disorder, unspecified: Secondary | ICD-10-CM

## 2013-09-11 DIAGNOSIS — I4892 Unspecified atrial flutter: Secondary | ICD-10-CM | POA: Diagnosis not present

## 2013-09-11 DIAGNOSIS — I358 Other nonrheumatic aortic valve disorders: Secondary | ICD-10-CM

## 2013-09-11 NOTE — Progress Notes (Signed)
Patient ID: [name] [name], female   DOB: 06/17/1929, 80 y.o.   MRN: [id number]    HPI  Patient is seen today to followup a history of atrial flutter in the past. She had an episode of atrial flutter in the past. She was cardioverted successfully. She was treated with Coumadin for a period of time. She then began having problems with falling and her Coumadin was stopped. She has not had any recurrence of her atrial flutter. She's been stable.  Allergies  Allergen Reactions  . Lisinopril Nausea And Vomiting  . Losartan     Raw throat  . Alprazolam Other (See Comments)    hyper  . Sertraline Hcl Diarrhea  . Sulfonamide Derivatives Rash    Current Outpatient Prescriptions  Medication Sig Dispense Refill  . alendronate (FOSAMAX) 70 MG tablet TAKE 1 TABLET BY MOUTH WITH A FULL GLASS OF WATER ON AN EMPTY STOMACH EVERY 7 DAYS  4 tablet  11  . aspirin 81 MG tablet Take 81 mg by mouth daily.        . benazepril-hydrochlorthiazide (LOTENSIN HCT) 20-12.5 MG per tablet TAKE 2 TABLETS BY MOUTH DAILY.  180 tablet  1  . citalopram (CELEXA) 40 MG tablet TAKE 1 TABLET BY MOUTH EVERY DAY  30 tablet  5  . CRESTOR 40 MG tablet TAKE 1 TABLET BY MOUTH EVERY DAY  30 tablet  2  . diltiazem (DILT-XR) 240 MG 24 hr capsule TAKE 1 CAPSULE BY MOUTH DAILY.  90 capsule  1  . esomeprazole (NEXIUM) 40 MG capsule Take 40 mg by mouth daily at 12 noon.      . fenofibrate (TRICOR) 145 MG tablet TAKE 1 TABLET BY MOUTH AT BEDTIME  30 tablet  5  . fesoterodine (TOVIAZ) 8 MG TB24 tablet Take 1 tablet (8 mg total) by mouth daily.  30 tablet  5  . fish oil-omega-3 fatty acids 1000 MG capsule Take 1 g by mouth daily. 2 caps bid       . fluticasone (FLONASE) 50 MCG/ACT nasal spray Place 2 sprays into both nostrils daily.  16 g  6  . furosemide (LASIX) 20 MG tablet Take 20 mg by mouth.      . gabapentin (NEURONTIN) 100 MG capsule Take 1 capsule (100 mg total) by mouth 3 (three) times daily.  90 capsule  3  . GLIPIZIDE XL 10 MG 24 hr  tablet Take 10 mg by mouth daily.      . Insulin Glargine (LANTUS) 100 UNIT/ML Solostar Pen Inject 13 Units into the skin daily.      Marland Kitchen L-Methylfolate-Algae-B12-B6 (METANX) 3-90.314-2-35 MG CAPS TAKE 1 CAPSULE BY MOUTH DAILY  30 capsule  3  . metFORMIN (GLUCOPHAGE) 1000 MG tablet Take 1 tablet (1,000 mg total) by mouth 2 (two) times daily with a meal.  180 tablet  3  . nystatin (MYCOSTATIN/NYSTOP) 100000 UNIT/GM POWD Apply to affected area(s) twice daily for 1 week then as needed  60 g  2  . oxyCODONE-acetaminophen (PERCOCET) 10-325 MG per tablet One tablet four times daily for severe uncontrolled pain  120 tablet  0  . [DISCONTINUED] solifenacin (VESICARE) 10 MG tablet Take 1 tablet (10 mg total) by mouth daily.  30 tablet  5   No current facility-administered medications for this visit.    History   Social History  . Marital Status: Married    Spouse Name: N/A    Number of Children: N/A  . Years of Education: 70  Occupational History  . retired    Social History Main Topics  . Smoking status: Never Smoker   . Smokeless tobacco: Never Used  . Alcohol Use: No  . Drug Use: No  . Sexual Activity: Not on file   Other Topics Concern  . Not on file   Social History Narrative  . No narrative on file    Family History  Problem Relation Age of Onset  . Dementia Mother   . Heart disease Father   . Diabetes    . Arthritis    . Colon cancer Neg Hx     Past Medical History  Diagnosis Date  . Atrial flutter febuary 2008       atrial flutter... DC Cardioversion...successful... holding sinus as of january 2011 coumadin therapy...discontinued...severe hematoma secondary to fall  . Hypertension   . Hyperlipidemia   . Hypercholesterolemia   . Mitral regurgitation 2010    mild ...echo...2010  . Obesity   . Sleep apnea   . Fatigue   . Depression with anxiety   . Visual loss     unspecified  . Basal cell carcinoma   . Hematochezia   . Hematoma     ...buttocks secondary to  fall november 2009 with coumadin  coumadin stopped  . Diabetes mellitus     type 2  . Back pain     with radiculopathy  . Arthritis   . Osteoarthritis   . Acute cystitis   . GERD (gastroesophageal reflux disease)   . Diverticulosis of colon   . Constipation   . IBS (irritable bowel syndrome)   . Abdominal bloating   . Nausea   . Erosive esophagitis   . Aortic valve sclerosis     Ef 60 %...echo...july 2010   . Chronic granulomatous disease     right chest ... ct chest... novmber 2009  . Normal nuclear stress test     september ,2007...no ischemia  . Anticoagulant long-term use     Coumadin  stopped...severe hematoma  from fall  . Edema     Painful, April, 2012  . Ejection fraction     EF 60-65%, echo, April 13, 2010, normal RV function, mild mitral regurgitation  . Carotid artery disease     Doppler, [facility], February 21, 2012, less than 50% bilateral carotid stenoses    Past Surgical History  Procedure Laterality Date  . Bilateral bunion removel  [id number]  . Replacement total knee bilateral      2000,2001  . Tonsillectomy  1957  . Appendectomy  1948  . Dilation and curettage of uterus  1952 and 2001  . Abdominal hysterectomy  2001  . Cataract extraction  2001    right   . Skin cancer excision    . Cataract extraction      left 2005  . Basal cell carcinoma excision  09/2009    right forarm and back of neck and left side   . Colonoscopy   07/17/2006    Dr. Rourk:anal canal hemorrhoids, diminutive rectal polyp status post cold biopsy.  Otherwise, normal rectum. Left sided and  transverse diverticula.  Diminutive polyp in the ascending colon, cold bx. hyperplastic polyps.   . Esophagogastroduodenoscopy   07/17/2006    Dr. Rourk:single 2 cm distal esophageal erosion consistent with erosive reflux esophagitis, otherwise normal    Patient Active Problem List   Diagnosis Date Noted  . Dysphagia, unspecified(787.20) 08/18/2013  . Bilateral leg weakness  08/17/2013  . Poor balance 08/17/2013  .  GERD with stricture 06/29/2013  . Seasonal allergies 06/29/2013  . Knee pain, left 05/27/2013  . Candidiasis of breast 04/09/2013  . Abnormal thyroid blood test 03/12/2013  . Carotid artery disease   . Multiple falls 02/15/2012  . Hand fracture 10/26/2011  . Routine general medical examination at a health care facility 09/30/2011  . Arthritis, shoulder region 05/02/2011  . Torn rotator cuff, left 05/02/2011  . Urinary incontinence 08/04/2010  . Ejection fraction   . Hyperlipidemia   . Aortic valve sclerosis   . Normal nuclear stress test   . Atrial flutter 01/28/2009  . MITRAL REGURGITATION, 0 (MILD) 09/28/2008  . OBESITY, UNSPECIFIED 09/12/2008  . GERD 05/11/2008  . DIVERTICULOSIS, COLON 05/11/2008  . CARCINOMA, BASAL CELL 04/30/2008  . EROSIVE ESOPHAGITIS 04/30/2008  . IRRITABLE BOWEL SYNDROME 04/30/2008  . HEMATOCHEZIA, HX OF 04/30/2008  . BACK PAIN WITH RADICULOPATHY 11/04/2007  . Diabetes mellitus with nephropathy 03/11/2007  . ANXIETY 03/11/2007  . DEPRESSION 03/11/2007  . HYPERTENSION 03/11/2007  . OSTEOARTHRITIS 03/11/2007    ROS   Patient denies fever, chills, headache, sweats, rash, change in vision, change in hearing, chest pain, cough, nausea vomiting, urinary symptoms. She does have pain in her right leg. All other systems are reviewed and are negative.  PHYSICAL EXAM  Patient is overweight. She walks with a walker. She is oriented to person time and place. Affect is normal. Head is atraumatic. Sclera and conjunctiva are normal. There is no jugulovenous distention. Lungs are clear. Respiratory effort is nonlabored. Cardiac exam reveals an S1 and S2. Abdomen is soft. There is no peripheral edema.  Filed Vitals:   09/11/13 1428  BP: 121/70  Pulse: 60  Height: 5\' 2"  (1.575 m)  Weight: 168 lb (76.204 kg)  SpO2: 95%   EKG is done today and reviewed by me. There is normal sinus rhythm. ASSESSMENT & PLAN

## 2013-09-11 NOTE — Assessment & Plan Note (Signed)
Blood pressure is controlled. No change in therapy. 

## 2013-09-11 NOTE — Assessment & Plan Note (Signed)
The patient had aortic valve sclerosis by echo in July, 2010. She does not have a murmur suggestive of significant aortic stenosis at this time. I've chosen not to proceed with a followup echo at this time.

## 2013-09-11 NOTE — Patient Instructions (Signed)
Continue all current medications. Your physician wants you to follow up in:  1 year.  You will receive a reminder letter in the mail one-two months in advance.  If you don't receive a letter, please call our office to schedule the follow up appointment   

## 2013-09-11 NOTE — Assessment & Plan Note (Signed)
The patient had one episode of atrial flutter that was successfully cardioverted several years ago. She was treated with Coumadin but then began having problems with falling. Her anticoagulation was stopped. There has been no recurrence. I've chosen not to restart anticoagulation at this point.

## 2013-09-14 ENCOUNTER — Ambulatory Visit (HOSPITAL_COMMUNITY)
Admission: RE | Admit: 2013-09-14 | Discharge: 2013-09-14 | Disposition: A | Discharge status: Home / Self Care | Payer: Medicare HMO | Source: Ambulatory Visit | Attending: Family Medicine | Admitting: Family Medicine

## 2013-09-14 ENCOUNTER — Encounter (HOSPITAL_COMMUNITY): Payer: Self-pay | Admitting: Internal Medicine

## 2013-09-14 DIAGNOSIS — R29898 Other symptoms and signs involving the musculoskeletal system: Secondary | ICD-10-CM | POA: Insufficient documentation

## 2013-09-14 DIAGNOSIS — R29818 Other symptoms and signs involving the nervous system: Secondary | ICD-10-CM | POA: Insufficient documentation

## 2013-09-14 DIAGNOSIS — IMO0001 Reserved for inherently not codable concepts without codable children: Secondary | ICD-10-CM | POA: Diagnosis present

## 2013-09-14 NOTE — Progress Notes (Addendum)
Physical Therapy Re-evaluation  Patient Details  Name: [name] [name] MRN: [id number] Date of Birth: 1929/05/06  Today's Date: 09/14/2013 Time: 8676-1950 PT Time Calculation (min): 35 min        Visit#: 2 of 12  Re-eval: 09/16/13 Authorization: [employer]    Authorization Visit#: 2 of 10  Charges:  MMT/ROM [phone] (15'), self care [phone] ([phone]')  Subjective Symptoms/Limitations Symptoms: Pt was evaluated X 1 month ago but has been unable to keep her appointments due to GI issues.  Pt states she has continued pain from Lt hip to ankle down lateral  LE. Pain Assessment Currently in Pain?: Yes Pain Score: 6     Objective: RLE Strength Right Hip Flexion: 4/5 (was 2+/5) Right Hip Extension: 2+/5 (was 2+/5) Right Hip ABduction: 2-/5 (was 2-/5) Right Knee Flexion: 3+/5 (was 3+/5) Right Knee Extension: 5/5 (was 5/5) Right Ankle Dorsiflexion: 2+/5 (was 2+/5) LLE Strength Left Hip Flexion: 4/5 (was 2+/5) Left Hip Extension: 3-/5 (was 2+/5) Left Hip ABduction: 3+/5 (was 3-/5) Left Knee Flexion: 4/5 (was 3+/5) Left Knee Extension: 5/5 (was 5/5) Left Ankle Dorsiflexion: 3-/5 (was 3-/5)  Exercise/Treatments Mobility/Balance  Berg Balance Test Sit to Stand: Able to stand  independently using hands Standing Unsupported: Able to stand safely 2 minutes Sitting with Back Unsupported but Feet Supported on Floor or Stool: Able to sit safely and securely 2 minutes Stand to Sit: Uses backs of legs against chair to control descent Transfers: Able to transfer safely, definite need of hands Standing Unsupported with Eyes Closed: Able to stand 10 seconds with supervision Standing Ubsupported with Feet Together: Needs help to attain position but able to stand for 30 seconds with feet together From Standing, Reach Forward with Outstretched Arm: Can reach forward >5 cm safely (2") From Standing Position, Pick up Object from Floor: Able to pick up shoe, needs supervision From Standing Position,  Turn to Look Behind Over each Shoulder: Turn sideways only but maintains balance Turn 360 Degrees: Needs close supervision or verbal cueing Standing Unsupported, Alternately Place Feet on Step/Stool: Able to complete >2 steps/needs minimal assist Standing Unsupported, One Foot in Front: Loses balance while stepping or standing Standing on One Leg: Unable to try or needs assist to prevent fall Total Score: 29    Physical Therapy Assessment and Plan PT Assessment and Plan Clinical Impression Statement: Ms Bertini returns today after evaluation on 8/17. Pt has missed appointments due to GI issues and has been nauseated/weak.  Pt received EGD on Thursday and is awainting results.  Pt admits to not completing HEP as she has not felt up to it.  despite this, Pt has increased LE strength, and improved BERG score today.  Pt has met 3/4 STG's and is progressing toward LTG's.  Encouraged patient to continue HEP and walking in her home.  Pt verbalized understanding.    Pt will benefit from skilled therapeutic intervention in order to improve on the following deficits: Decreased activity tolerance;Decreased strength;Difficulty walking;Obesity;Decreased mobility;Decreased balance Rehab Potential: Good PT Frequency: Min 2X/week PT Duration: 6 weeks PT Treatment/Interventions: Gait training;Therapeutic exercise;Therapeutic activities;Balance training;Patient/family education PT Plan: Resume Physical Therapy with focus on balance and further increasing LE strength and posture.    Goals Home Exercise Program Pt/caregiver will Perform Home Exercise Program: For increased strengthening PT Goal: Perform Home Exercise Program - Progress: Progressing toward goal PT Short Term Goals Time to Complete Short Term Goals: 2 weeks PT Short Term Goal 1: Pt to have had no falls PT Short Term Goal  1 - Progress: Met PT Short Term Goal 2: Pt to be ambulating with walker for at least five minute every day PT Short Term Goal 2  - Progress: Met (walking without AD "furniture walking" in home) PT Short Term Goal 3: Pt to only have 20 degree forward bend when ambulating.  PT Short Term Goal 3 - Progress: Progressing toward goal PT Short Term Goal 4: Berg score increased 5 pt to reduce risk of falling  PT Short Term Goal 4 - Progress: Met PT Long Term Goals Time to Complete Long Term Goals: 4 weeks PT Long Term Goal 1: I in advance HEP PT Long Term Goal 1 - Progress: Progressing toward goal PT Long Term Goal 2: Pt to be ambulating with a walker for at least 10 minutes a day  PT Long Term Goal 2 - Progress: Progressing toward goal Long Term Goal 3: Pt to have a ten degree forward bend or less when ambulating with walker  Long Term Goal 3 Progress: Progressing toward goal Long Term Goal 4: Berg score increased by 10 to reduce risk of falling  Long Term Goal 4 Progress: Progressing toward goal PT Long Term Goal 5: Pt strength to be improved one grade to be able to come sit to stand on one attempt  Long Term Goal 5 Progress: Progressing toward goal  Problem List Patient Active Problem List   Diagnosis Date Noted  . Dysphagia, unspecified(787.20) 08/18/2013  . Bilateral leg weakness 08/17/2013  . Poor balance 08/17/2013  . GERD with stricture 06/29/2013  . Seasonal allergies 06/29/2013  . Knee pain, left 05/27/2013  . Candidiasis of breast 04/09/2013  . Abnormal thyroid blood test 03/12/2013  . Carotid artery disease   . Multiple falls 02/15/2012  . Hand fracture 10/26/2011  . Routine general medical examination at a health care facility 09/30/2011  . Arthritis, shoulder region 05/02/2011  . Torn rotator cuff, left 05/02/2011  . Urinary incontinence 08/04/2010  . Ejection fraction   . Hyperlipidemia   . Aortic valve sclerosis   . Normal nuclear stress test   . Atrial flutter 01/28/2009  . MITRAL REGURGITATION, 0 (MILD) 09/28/2008  . OBESITY, UNSPECIFIED 09/12/2008  . GERD 05/11/2008  . DIVERTICULOSIS,  COLON 05/11/2008  . CARCINOMA, BASAL CELL 04/30/2008  . EROSIVE ESOPHAGITIS 04/30/2008  . IRRITABLE BOWEL SYNDROME 04/30/2008  . HEMATOCHEZIA, HX OF 04/30/2008  . BACK PAIN WITH RADICULOPATHY 11/04/2007  . Diabetes mellitus with nephropathy 03/11/2007  . ANXIETY 03/11/2007  . DEPRESSION 03/11/2007  . HYPERTENSION 03/11/2007  . OSTEOARTHRITIS 03/11/2007    PT Plan of Care PT Home Exercise Plan: given  GP Functional Assessment Tool Used: clinical judgement  Functional Limitation: Mobility: Walking and moving around Mobility: Walking and Moving Around Current Status ([id number]): At least 80 percent but less than 100 percent impaired, limited or restricted Mobility: Walking and Moving Around Goal Status [phone]): At least 60 percent but less than 80 percent impaired, limited or restricted  Teena Irani, PTA/CLT/Cindy Russell PT 09/14/2013, 10:15 AM

## 2013-09-16 ENCOUNTER — Ambulatory Visit (HOSPITAL_COMMUNITY)
Admission: RE | Admit: 2013-09-16 | Discharge: 2013-09-16 | Disposition: A | Discharge status: Home / Self Care | Payer: Medicare HMO | Source: Ambulatory Visit | Attending: Family Medicine | Admitting: Family Medicine

## 2013-09-16 DIAGNOSIS — IMO0001 Reserved for inherently not codable concepts without codable children: Secondary | ICD-10-CM | POA: Diagnosis not present

## 2013-09-16 NOTE — Progress Notes (Signed)
Physical Therapy Treatment Patient Details  Name: [name] [name] Amend MRN: [id number] Date of Birth: [birth year]  Today's Date: 09/16/2013 Time: 9675-9163 PT Time Calculation (min): 45 min  Visit#: 3 of 12  Re-eval: 09/16/13 Authorization: [employer]  Authorization Visit#: 3 of 10  Charges:  Gait [phone] (8'), therex [phone] (36')  Subjective: Symptoms/Limitations Symptoms: PT states she is overall feeling better and is getting around alot better than last week. Pain Assessment Currently in Pain?: No/denies   Exercise/Treatments Standing Gait Training: 125 feet with walker focusing on posture and keeping Rt LE in neutral Other Standing Knee Exercises: sit to stand 5 reps without UE assist Seated Long Arc Quad: Limitations Long Arc Quad Limitations: marching 10 reps Other Seated Knee Exercises: scapular retraction,ankle DF/PF x 10 Other Seated Knee Exercises: thoracic excursion with arms crossed 10 reps Supine Bridges: 10 reps Straight Leg Raises: 10 reps;AAROM;Limitations Straight Leg Raises Limitations: AAROM to keep Rt foot in neutral Other Supine Knee Exercises: bent knee raise 10 reps Sidelying Hip ABduction: 10 reps;AAROM;Limitations Hip ABduction Limitations: to keep foot in neutral/alignment Clams: 10X5" each Prone  Hamstring Curl: 10 reps;Limitations Hamstring Curl Limitations: AAROM to keep Rt LE in neutral Hip Extension: 10 reps      Physical Therapy Assessment and Plan PT Assessment and Plan Clinical Impression Statement: Resumed mat exercises and added new exercises to focus on weak LE musculature.  Pt requires AA with all Rt LE exercises due to weakness/tendency to ER Rt LE.  Noted hip abductor weakness is contibuting to excessive Rt LE ER.  Focused on gait with erect posture and rt LE in neutral.  Pt independent with basic bed mobility. PT Plan: Concentrate on balance and strengthening of B LE as well as posture to improve the safety of pt ambulation.        Problem List Patient Active Problem List   Diagnosis Date Noted  . Dysphagia, unspecified(787.20) 08/18/2013  . Bilateral leg weakness 08/17/2013  . Poor balance 08/17/2013  . GERD with stricture 06/29/2013  . Seasonal allergies 06/29/2013  . Knee pain, left 05/27/2013  . Candidiasis of breast 04/09/2013  . Abnormal thyroid blood test 03/12/2013  . Carotid artery disease   . Multiple falls 02/15/2012  . Hand fracture 10/26/2011  . Routine general medical examination at a health care facility 09/30/2011  . Arthritis, shoulder region 05/02/2011  . Torn rotator cuff, left 05/02/2011  . Urinary incontinence 08/04/2010  . Ejection fraction   . Hyperlipidemia   . Aortic valve sclerosis   . Normal nuclear stress test   . Atrial flutter 01/28/2009  . MITRAL REGURGITATION, 0 (MILD) 09/28/2008  . OBESITY, UNSPECIFIED 09/12/2008  . GERD 05/11/2008  . DIVERTICULOSIS, COLON 05/11/2008  . CARCINOMA, BASAL CELL 04/30/2008  . EROSIVE ESOPHAGITIS 04/30/2008  . IRRITABLE BOWEL SYNDROME 04/30/2008  . HEMATOCHEZIA, HX OF 04/30/2008  . BACK PAIN WITH RADICULOPATHY 11/04/2007  . Diabetes mellitus with nephropathy 03/11/2007  . ANXIETY 03/11/2007  . DEPRESSION 03/11/2007  . HYPERTENSION 03/11/2007  . OSTEOARTHRITIS 03/11/2007    PT Plan of Care PT Home Exercise Plan: given   Teena Irani, PTA/CLT 09/16/2013, 10:51 AM

## 2013-09-21 ENCOUNTER — Ambulatory Visit (HOSPITAL_COMMUNITY): Payer: Medicare HMO | Admitting: Physical Therapy

## 2013-09-23 ENCOUNTER — Ambulatory Visit (HOSPITAL_COMMUNITY)
Admission: RE | Admit: 2013-09-23 | Discharge: 2013-09-23 | Disposition: A | Discharge status: Home / Self Care | Payer: Medicare HMO | Source: Ambulatory Visit | Attending: Family Medicine | Admitting: Family Medicine

## 2013-09-23 DIAGNOSIS — IMO0001 Reserved for inherently not codable concepts without codable children: Secondary | ICD-10-CM | POA: Diagnosis not present

## 2013-09-23 NOTE — Progress Notes (Signed)
Physical Therapy Treatment Patient Details  Name: [name] [name] MRN: [id number] Date of Birth: January 31, 1929  Today's Date: 09/23/2013 Time: 6712-4580 PT Time Calculation (min): 43 min Visit#: 4 of 12  Re-eval: 10/12/13 Authorization: [employer]  Authorization Visit#: 4 of 10  Charges:  Gait [phone] (8'), therex [phone] (32')  Subjective: Symptoms/Limitations Symptoms: Pt states she was sore after last visit. Admits to not doing her HEP as often as she should. Pain Assessment Currently in Pain?: No/denies   Exercise/Treatments Standing Gait Training: 175 feet Other Standing Knee Exercises: sit to stand 10 reps without UE assist Seated Long Arc Quad: Limitations Long Arc Quad Limitations: marching 10 reps Other Seated Knee Exercises: scapular retraction,ankle DF/PF x 10 Other Seated Knee Exercises: 2D thoracic excursion 5X Supine Bridges: 15 reps Straight Leg Raises: 10 reps;AAROM;Limitations Straight Leg Raises Limitations: AAROM to keep Rt foot in neutral Other Supine Knee Exercises: bent knee raise 10 reps Sidelying Hip ABduction: 10 reps;AAROM;Limitations Hip ABduction Limitations: to keep foot in neutral/alignment Clams: 10X5" each Prone  Hamstring Curl: 10 reps;Limitations Hamstring Curl Limitations: AAROM to keep Rt LE in neutral Hip Extension: 10 reps      Physical Therapy Assessment and Plan PT Assessment and Plan Clinical Impression Statement: Continued to progress strength and independence with gait.  Pt able to increase ambulation distance to 175 feet today.  Constant VC's to keep erect posture and keep Rt LE in neutral.  Pt tends to place too much weight into UE's with ambuation.  Encourged to rely more on LE's.  Increased reps of therex today without difficulty.  Contiues to require therapist assist to keep Rt LE in neutral with therex.  PT Plan: Concentrate on balance and strengthening of B LE as well as posture to improve the safety of pt ambulation.   Add nustep for activity tolerance.      Problem List Patient Active Problem List   Diagnosis Date Noted  . Dysphagia, unspecified(787.20) 08/18/2013  . Bilateral leg weakness 08/17/2013  . Poor balance 08/17/2013  . GERD with stricture 06/29/2013  . Seasonal allergies 06/29/2013  . Knee pain, left 05/27/2013  . Candidiasis of breast 04/09/2013  . Abnormal thyroid blood test 03/12/2013  . Carotid artery disease   . Multiple falls 02/15/2012  . Hand fracture 10/26/2011  . Routine general medical examination at a health care facility 09/30/2011  . Arthritis, shoulder region 05/02/2011  . Torn rotator cuff, left 05/02/2011  . Urinary incontinence 08/04/2010  . Ejection fraction   . Hyperlipidemia   . Aortic valve sclerosis   . Normal nuclear stress test   . Atrial flutter 01/28/2009  . MITRAL REGURGITATION, 0 (MILD) 09/28/2008  . OBESITY, UNSPECIFIED 09/12/2008  . GERD 05/11/2008  . DIVERTICULOSIS, COLON 05/11/2008  . CARCINOMA, BASAL CELL 04/30/2008  . EROSIVE ESOPHAGITIS 04/30/2008  . IRRITABLE BOWEL SYNDROME 04/30/2008  . HEMATOCHEZIA, HX OF 04/30/2008  . BACK PAIN WITH RADICULOPATHY 11/04/2007  . Diabetes mellitus with nephropathy 03/11/2007  . ANXIETY 03/11/2007  . DEPRESSION 03/11/2007  . HYPERTENSION 03/11/2007  . OSTEOARTHRITIS 03/11/2007    PT Plan of Care PT Home Exercise Plan: given   Teena Irani, PTA/CLT 09/23/2013, 12:29 PM

## 2013-09-29 ENCOUNTER — Ambulatory Visit (HOSPITAL_COMMUNITY)
Admission: RE | Admit: 2013-09-29 | Discharge: 2013-09-29 | Disposition: A | Discharge status: Home / Self Care | Payer: Medicare HMO | Source: Ambulatory Visit | Attending: Family Medicine | Admitting: Family Medicine

## 2013-09-29 DIAGNOSIS — IMO0001 Reserved for inherently not codable concepts without codable children: Secondary | ICD-10-CM | POA: Diagnosis not present

## 2013-09-29 NOTE — Progress Notes (Signed)
Physical Therapy Treatment Patient Details  Name: [name] [name] MRN: [id number] Date of Birth: Nov 16, 1929  Today's Date: 09/29/2013 Time: 1022-1110 PT Time Calculation (min): 48 min Visit#: 5 of 12  Re-eval: 10/12/13 Authorization: [employer]  Authorization Visit#: 5 of 10  Charges:  Gait 1022-1030 (8'), therex [phone] (36')  Subjective: Symptoms/Limitations Symptoms: Pt states she's been doing more exercises at home.  States she can tell she is getting stronger.  Pt would like to work on her UE's more as they tire easily. Pain Assessment Currently in Pain?: No/denies   Exercise/Treatments Standing Gait Training: 175 feet Other Standing Knee Exercises: sit to stand 10 reps without UE assist Seated Long Arc Quad: Limitations Long Arc Quad Limitations: marching 15 reps Other Seated Knee Exercises: scap retraction, UE flexion, UE abduction, UE horiz abduction 10 reps each with 1# weight Supine Bridges: 15 reps Straight Leg Raises: 2 sets;10 reps Straight Leg Raises Limitations: AAROM to keep Rt foot in neutral Other Supine Knee Exercises: bent knee raise 15 reps Sidelying Hip ABduction: 15 reps Hip ABduction Limitations: to keep foot in neutral/alignment Clams: 10X5" each Prone  Hamstring Curl: 2 sets;10 reps Hamstring Curl Limitations: AAROM to keep Rt LE in neutral Hip Extension: 15 reps      Physical Therapy Assessment and Plan PT Assessment and Plan Clinical Impression Statement: Progressed UE seated therex using 1# weight.  Pt with limited ROM bilaterally, however able to complete exercises.  Noted improvment keeping Rt foot in neutral with exercises and gait.  Able to increase reps today without difficutly.  Pt continues to require VC's for keeping posture erect with gait  and keeping Rt foot in neutral position.   PT Plan: Add nustep for activity tolerance and progress to standing exercises.       Problem List Patient Active Problem List   Diagnosis Date  Noted  . Dysphagia, unspecified(787.20) 08/18/2013  . Bilateral leg weakness 08/17/2013  . Poor balance 08/17/2013  . GERD with stricture 06/29/2013  . Seasonal allergies 06/29/2013  . Knee pain, left 05/27/2013  . Candidiasis of breast 04/09/2013  . Abnormal thyroid blood test 03/12/2013  . Carotid artery disease   . Multiple falls 02/15/2012  . Hand fracture 10/26/2011  . Routine general medical examination at a health care facility 09/30/2011  . Arthritis, shoulder region 05/02/2011  . Torn rotator cuff, left 05/02/2011  . Urinary incontinence 08/04/2010  . Ejection fraction   . Hyperlipidemia   . Aortic valve sclerosis   . Normal nuclear stress test   . Atrial flutter 01/28/2009  . MITRAL REGURGITATION, 0 (MILD) 09/28/2008  . OBESITY, UNSPECIFIED 09/12/2008  . GERD 05/11/2008  . DIVERTICULOSIS, COLON 05/11/2008  . CARCINOMA, BASAL CELL 04/30/2008  . EROSIVE ESOPHAGITIS 04/30/2008  . IRRITABLE BOWEL SYNDROME 04/30/2008  . HEMATOCHEZIA, HX OF 04/30/2008  . BACK PAIN WITH RADICULOPATHY 11/04/2007  . Diabetes mellitus with nephropathy 03/11/2007  . ANXIETY 03/11/2007  . DEPRESSION 03/11/2007  . HYPERTENSION 03/11/2007  . OSTEOARTHRITIS 03/11/2007    PT Plan of Care PT Home Exercise Plan: given     Teena Irani, PTA/CLT 09/29/2013, 11:26 AM

## 2013-09-30 ENCOUNTER — Other Ambulatory Visit: Payer: Self-pay

## 2013-09-30 MED ORDER — OXYCODONE-ACETAMINOPHEN 10-325 MG PO TABS: ORAL_TABLET | ORAL | Status: DC

## 2013-10-01 ENCOUNTER — Ambulatory Visit (INDEPENDENT_AMBULATORY_CARE_PROVIDER_SITE_OTHER): Payer: Medicare HMO | Admitting: Otolaryngology

## 2013-10-01 ENCOUNTER — Telehealth (HOSPITAL_COMMUNITY): Payer: Self-pay

## 2013-10-01 ENCOUNTER — Ambulatory Visit (HOSPITAL_COMMUNITY): Payer: Medicare HMO | Admitting: Physical Therapy

## 2013-10-01 DIAGNOSIS — R1312 Dysphagia, oropharyngeal phase: Secondary | ICD-10-CM

## 2013-10-01 DIAGNOSIS — K219 Gastro-esophageal reflux disease without esophagitis: Secondary | ICD-10-CM

## 2013-10-01 NOTE — Telephone Encounter (Signed)
cx - has company

## 2013-10-05 ENCOUNTER — Ambulatory Visit (HOSPITAL_COMMUNITY): Payer: Medicare HMO | Admitting: Physical Therapy

## 2013-10-07 ENCOUNTER — Ambulatory Visit (HOSPITAL_COMMUNITY)
Admission: RE | Admit: 2013-10-07 | Discharge: 2013-10-07 | Disposition: A | Discharge status: Home / Self Care | Payer: Commercial Managed Care - HMO | Source: Ambulatory Visit | Attending: Family Medicine | Admitting: Family Medicine

## 2013-10-07 DIAGNOSIS — M545 Low back pain: Secondary | ICD-10-CM | POA: Insufficient documentation

## 2013-10-07 DIAGNOSIS — R29898 Other symptoms and signs involving the musculoskeletal system: Secondary | ICD-10-CM

## 2013-10-07 DIAGNOSIS — R2689 Other abnormalities of gait and mobility: Secondary | ICD-10-CM

## 2013-10-07 NOTE — Progress Notes (Signed)
Physical Therapy Treatment Patient Details  Name: [name] [name] MRN: [id number] Date of Birth: June 21, 1929  Today's Date: 10/07/2013 Time: 0092-3300 PT Time Calculation (min): 42 min Charge:  There ex:  7622-6333  Visit#: 6 of 12  Re-eval: 10/12/13    Authorization: [employer]   Authorization Visit#: 6 of 10   Subjective: Symptoms/Limitations Symptoms: Pt brother has been in town thererfore she has not been doing her exercises.  Pain Assessment Currently in Pain?: Yes Pain Score: 8  Pain Location: Back    Exercise/Treatments   Aerobic Stationary Bike: Nustep hills 3; L 3 x 8:00 Standing Other Standing Lumbar Exercises: standing at wall pulling into upright postition  Other Standing Lumbar Exercises: standing upright shifting wt rt/lt; side step to Lt  Seated Other Seated Lumbar Exercises: scapular retraction x 10  Other Seated Lumbar Exercises: sit to stand x 10    Prone  Straight Leg Raise: 10 reps Other Prone Lumbar Exercises: glut set/ heel squeeze x 15  Other Prone Lumbar Exercises: IR/ER of hip       Physical Therapy Assessment and Plan PT Assessment and Plan Clinical Impression Statement: Pt unable to complete standing activity in good posture. Treatment at this point wil focus on strengthening core and back mm to allow pt to hold self upright for prolong period of time.   PT Plan: Add nustep for activity tolerance and progress prone exercises with shoulder extension, rows and w-back .         Problem List Patient Active Problem List   Diagnosis Date Noted  . Dysphagia, unspecified(787.20) 08/18/2013  . Bilateral leg weakness 08/17/2013  . Poor balance 08/17/2013  . GERD with stricture 06/29/2013  . Seasonal allergies 06/29/2013  . Knee pain, left 05/27/2013  . Candidiasis of breast 04/09/2013  . Abnormal thyroid blood test 03/12/2013  . Carotid artery disease   . Multiple falls 02/15/2012  . Hand fracture 10/26/2011  . Routine general medical  examination at a health care facility 09/30/2011  . Arthritis, shoulder region 05/02/2011  . Torn rotator cuff, left 05/02/2011  . Urinary incontinence 08/04/2010  . Ejection fraction   . Hyperlipidemia   . Aortic valve sclerosis   . Normal nuclear stress test   . Atrial flutter 01/28/2009  . MITRAL REGURGITATION, 0 (MILD) 09/28/2008  . OBESITY, UNSPECIFIED 09/12/2008  . GERD 05/11/2008  . DIVERTICULOSIS, COLON 05/11/2008  . CARCINOMA, BASAL CELL 04/30/2008  . EROSIVE ESOPHAGITIS 04/30/2008  . IRRITABLE BOWEL SYNDROME 04/30/2008  . HEMATOCHEZIA, HX OF 04/30/2008  . BACK PAIN WITH RADICULOPATHY 11/04/2007  . Diabetes mellitus with nephropathy 03/11/2007  . ANXIETY 03/11/2007  . DEPRESSION 03/11/2007  . HYPERTENSION 03/11/2007  . OSTEOARTHRITIS 03/11/2007       GP    RUSSELL,CINDY 10/07/2013, 12:35 PM

## 2013-10-10 LAB — COMPLETE METABOLIC PANEL WITH GFR
ALT: 13 U/L (ref 0–35)
AST: 17 U/L (ref 0–37)
Albumin: 4.1 g/dL (ref 3.5–5.2)
Alkaline Phosphatase: 30 U/L — ABNORMAL LOW (ref 39–117)
BILIRUBIN TOTAL: 0.2 mg/dL (ref 0.2–1.2)
BUN: 32 mg/dL — AB (ref 6–23)
CHLORIDE: 101 meq/L (ref 96–112)
CO2: 30 meq/L (ref 19–32)
CREATININE: 1.15 mg/dL — AB (ref 0.50–1.10)
Calcium: 9.4 mg/dL (ref 8.4–10.5)
GFR, EST AFRICAN AMERICAN: 51 mL/min — AB
GFR, Est Non African American: 44 mL/min — ABNORMAL LOW
Glucose, Bld: 119 mg/dL — ABNORMAL HIGH (ref 70–99)
Potassium: 4.5 mEq/L (ref 3.5–5.3)
Sodium: 140 mEq/L (ref 135–145)
Total Protein: 6.9 g/dL (ref 6.0–8.3)

## 2013-10-11 LAB — HEMOGLOBIN A1C
Hgb A1c MFr Bld: 7.6 % — ABNORMAL HIGH (ref <5.7)
Mean Plasma Glucose: 171 mg/dL — ABNORMAL HIGH (ref <117)

## 2013-10-12 ENCOUNTER — Ambulatory Visit (HOSPITAL_COMMUNITY)
Admission: RE | Admit: 2013-10-12 | Discharge: 2013-10-12 | Disposition: A | Discharge status: Home / Self Care | Payer: Commercial Managed Care - HMO | Source: Ambulatory Visit | Attending: Family Medicine | Admitting: Family Medicine

## 2013-10-12 DIAGNOSIS — M545 Low back pain: Secondary | ICD-10-CM | POA: Diagnosis not present

## 2013-10-12 LAB — VITAMIN D 25 HYDROXY (VIT D DEFICIENCY, FRACTURES): VIT D 25 HYDROXY: 59 ng/mL (ref 30–89)

## 2013-10-12 NOTE — Progress Notes (Signed)
Physical Therapy Treatment Patient Details  Name: [name] [name] MRN: [id number] Date of Birth: [birth year]  Today's Date: 10/12/2013 Time: 1028-1110 PT Time Calculation (min): 42 min TE 1028-1110  Visit#: 7 of 12  Re-eval: 10/12/13 Assessment Diagnosis: unsteady gait  Authorization Visit#: 7 of 10   Subjective: Symptoms/Limitations Symptoms: Pt reports muscle soreness from last treatment session, though very little soreness today.   Exercise/Treatments Stretches Passive Hamstring Stretch: 2 reps;20 seconds;Limitations Passive Hamstring Stretch Limitations: Supine with rope Quad Stretch: 2 reps;20 seconds;Limitations Sports administrator Limitations: Manual stretch Gastroc Stretch: 3 reps;20 seconds;Limitations Gastroc Stretch Limitations: Slantboard Standing Other Standing Knee Exercises: sit to stand 10 reps without UE assist Supine Straight Leg Raises: 2 sets;10 reps Straight Leg Raises Limitations: AAROM to keep Rt foot in neutral Sidelying Clams: 2x10 with RTB Prone  Hip Extension: 2 sets;10 reps      Physical Therapy Assessment and Plan PT Assessment and Plan Clinical Impression Statement: Pt reports some muscular soreness after last treatment session, so stretches added to treatment session to help decrease muscle tightness/soreness.   Pt was able to complete exercises today, except SLR ABD secondary to muscular soreness; clam shells performed with resistance of theraband to increase hip abductor strength.  No pain reported at end of treatment session, though muscular soreness was reported.  Educated pt to avoid sitting/lying in bed/chair too long today and tomorrow and to walk around home multiple times throughout the day to break up lactic acid formation with the muscle soreness.   Pt will benefit from skilled therapeutic intervention in order to improve on the following deficits: Decreased activity tolerance;Decreased strength;Difficulty walking;Obesity;Decreased  mobility;Decreased balance PT Plan: Progress strengthening activities to tolerance, and add balance exercises.     Goals PT Short Term Goals PT Short Term Goal 3: Pt to only have 20 degree forward bend when ambulating.  PT Short Term Goal 3 - Progress: Progressing toward goal PT Long Term Goals PT Long Term Goal 2: Pt to be ambulating with a walker for at least 10 minutes a day  PT Long Term Goal 2 - Progress: Progressing toward goal  Problem List Patient Active Problem List   Diagnosis Date Noted  . Dysphagia, unspecified(787.20) 08/18/2013  . Bilateral leg weakness 08/17/2013  . Poor balance 08/17/2013  . GERD with stricture 06/29/2013  . Seasonal allergies 06/29/2013  . Knee pain, left 05/27/2013  . Candidiasis of breast 04/09/2013  . Abnormal thyroid blood test 03/12/2013  . Carotid artery disease   . Multiple falls 02/15/2012  . Hand fracture 10/26/2011  . Routine general medical examination at a health care facility 09/30/2011  . Arthritis, shoulder region 05/02/2011  . Torn rotator cuff, left 05/02/2011  . Urinary incontinence 08/04/2010  . Ejection fraction   . Hyperlipidemia   . Aortic valve sclerosis   . Normal nuclear stress test   . Atrial flutter 01/28/2009  . MITRAL REGURGITATION, 0 (MILD) 09/28/2008  . OBESITY, UNSPECIFIED 09/12/2008  . GERD 05/11/2008  . DIVERTICULOSIS, COLON 05/11/2008  . CARCINOMA, BASAL CELL 04/30/2008  . EROSIVE ESOPHAGITIS 04/30/2008  . IRRITABLE BOWEL SYNDROME 04/30/2008  . HEMATOCHEZIA, HX OF 04/30/2008  . BACK PAIN WITH RADICULOPATHY 11/04/2007  . Diabetes mellitus with nephropathy 03/11/2007  . ANXIETY 03/11/2007  . DEPRESSION 03/11/2007  . HYPERTENSION 03/11/2007  . OSTEOARTHRITIS 03/11/2007    PT - End of Session Activity Tolerance: Patient limited by fatigue   Keita Demarco 10/12/2013, 11:24 AM

## 2013-10-13 ENCOUNTER — Encounter (INDEPENDENT_AMBULATORY_CARE_PROVIDER_SITE_OTHER): Payer: Self-pay

## 2013-10-13 ENCOUNTER — Ambulatory Visit (INDEPENDENT_AMBULATORY_CARE_PROVIDER_SITE_OTHER): Payer: Medicare HMO | Admitting: Family Medicine

## 2013-10-13 ENCOUNTER — Encounter: Payer: Self-pay | Admitting: Family Medicine

## 2013-10-13 VITALS — BP 130/72 | HR 87 | Resp 16 | Ht 62.0 in | Wt 167.0 lb

## 2013-10-13 DIAGNOSIS — M8580 Other specified disorders of bone density and structure, unspecified site: Secondary | ICD-10-CM

## 2013-10-13 DIAGNOSIS — E785 Hyperlipidemia, unspecified: Secondary | ICD-10-CM

## 2013-10-13 DIAGNOSIS — K222 Esophageal obstruction: Secondary | ICD-10-CM

## 2013-10-13 DIAGNOSIS — I10 Essential (primary) hypertension: Secondary | ICD-10-CM

## 2013-10-13 DIAGNOSIS — K219 Gastro-esophageal reflux disease without esophagitis: Secondary | ICD-10-CM

## 2013-10-13 DIAGNOSIS — F419 Anxiety disorder, unspecified: Secondary | ICD-10-CM

## 2013-10-13 DIAGNOSIS — N39498 Other specified urinary incontinence: Secondary | ICD-10-CM

## 2013-10-13 DIAGNOSIS — F32A Depression, unspecified: Secondary | ICD-10-CM

## 2013-10-13 DIAGNOSIS — E1321 Other specified diabetes mellitus with diabetic nephropathy: Secondary | ICD-10-CM

## 2013-10-13 DIAGNOSIS — N183 Chronic kidney disease, stage 3 unspecified: Secondary | ICD-10-CM

## 2013-10-13 DIAGNOSIS — F418 Other specified anxiety disorders: Secondary | ICD-10-CM

## 2013-10-13 DIAGNOSIS — F329 Major depressive disorder, single episode, unspecified: Secondary | ICD-10-CM

## 2013-10-13 DIAGNOSIS — J320 Chronic maxillary sinusitis: Secondary | ICD-10-CM

## 2013-10-13 DIAGNOSIS — J42 Unspecified chronic bronchitis: Secondary | ICD-10-CM | POA: Insufficient documentation

## 2013-10-13 DIAGNOSIS — J411 Mucopurulent chronic bronchitis: Secondary | ICD-10-CM

## 2013-10-13 MED ORDER — PENICILLIN V POTASSIUM 500 MG PO TABS: 500.0000 mg | ORAL_TABLET | Freq: Three times a day (TID) | ORAL | Status: DC

## 2013-10-13 NOTE — Progress Notes (Signed)
   Subjective:    Patient ID: [name] [name], female    DOB: 1929-01-15, 80 y.o.   MRN: [id number]  HPI The PT is here for follow up and re-evaluation of chronic medical conditions, medication management and review of any available recent lab and radiology data.  Preventive health is updated, specifically  Cancer screening and Immunization.   Has been on PT and will be re evaluated in am to see if needs scooter or if she needs to continue PT. Also has had upper endo and will have eye exam The PT denies any adverse reactions to current medications since the last visit. Has log book, no hypoglycemia, blood sugars are doing well  4 week h/o sinus pressure and cottage cheese post nasal drainage and intermittent chills  Recent eval by gi, recommendation is to d/c fosamax due to stricture      Review of Systems See HPI  Denies chest pains, palpitations and leg swelling Denies abdominal pain, nausea, vomiting,diarrhea or constipation.   Denies dysuria, frequency, hesitancy or incontinence. Chronic  joint pain, swelling and limitation in mobility. Denies headaches, seizures, numbness, or tingling. Denies depression, anxiety or insomnia. Denies skin break down or rash.        Objective:   Physical Exam BP 130/72  Pulse 87  Resp 16  Ht 5\' 2"  (1.575 m)  Wt 167 lb (75.751 kg)  BMI 30.54 kg/m2  SpO2 97% Patient alert and oriented and in no cardiopulmonary distress.  HEENT: No facial asymmetry, EOMI,   oropharynx pink and moist.  Neck decreased ROM,no JVD, no mass.Maxillary sinuses tender, TM clear  Chest: scattered crackles , no wheezes , aadequate air entry CVS: S1, S2 no murmurs, no S3.Regular rate.  ABD: Soft non tender.   Ext: No edema  MS: decreased  ROM spine, shoulders, hips and knees.  Skin: Intact, no ulcerations or rash noted.  Psych: Good eye contact, normal affect. Memory intact not anxious or depressed appearing.  CNS: CN 2-12 intact, power,  normal throughout.no  focal deficits noted.        Assessment & Plan:  Essential hypertension Controlled, no change in medication   Chronic bronchitis Symptomatic x weeks, antibiotic course prescribed, hold on flu  Vaccine at this time  GERD with stricture Fosamax d/c due to stricture, management by GI  Diabetes mellitus with nephropathy Controlled, no change in medication Patient advised to reduce carb and sweets, commit to regular physical activity, take meds as prescribed, test blood as directed, and attempt to lose weight, to improve blood sugar control.   Anxiety and depression Controlled, no change in medication   Urinary incontinence Improved with medication, continue same  Sinusitis, chronic Antibiotic course prescribed  Osteopenia osteopenia with high fall risk, need to change to parenteral bisphosphonate due to stricture, will try and arrange annual reclast, pt does have CKD so will need to discuss with pharmacy  CKD (chronic kidney disease) stage 3, GFR 30-59 ml/min Slightly imprpovede. Pt to keep blood pressure and blood suagr controlled and avoid NSAIDS

## 2013-10-13 NOTE — Patient Instructions (Addendum)
F/u mid to end Jan call if you need me before  Return in the afternoon of 10/29 or am of 10/30 for flu vaccine  You need to STOP fosamax and I will get you set up to take once yearly injection at the hospital through your veins for bone building, this is because of you reflux and narrowing of your esophagus   You are being treated for chronic sinusitis and bronchitis, take all antibiotic prescribed  BP is excellent and kidney function is improving, diabetes is well controlled  Fasting lipid, cmp and eGFr, hBA1C

## 2013-10-14 ENCOUNTER — Other Ambulatory Visit: Payer: Self-pay | Admitting: Family Medicine

## 2013-10-14 ENCOUNTER — Ambulatory Visit (HOSPITAL_COMMUNITY)
Admission: RE | Admit: 2013-10-14 | Discharge: 2013-10-14 | Disposition: A | Discharge status: Home / Self Care | Payer: Commercial Managed Care - HMO | Source: Ambulatory Visit | Attending: Family Medicine | Admitting: Family Medicine

## 2013-10-14 DIAGNOSIS — M545 Low back pain: Secondary | ICD-10-CM | POA: Diagnosis not present

## 2013-10-14 NOTE — Progress Notes (Signed)
Physical Therapy Treatment Patient Details  Name: [name] [name] MRN: [id number] Date of Birth: 1929/11/06  Today's Date: 10/14/2013 Time: 8182-9937 PT Time Calculation (min): 47 min 52' TE  Visit#: 8 of 12  Re-eval: 10/12/13    Authorization: [employer]  Authorization Time Period:    Authorization Visit#: 8 of 10   Subjective: Symptoms/Limitations Symptoms: Patient reports 9/10 R low back pain with radicular symptoms down to toes Pain Assessment Currently in Pain?: Yes Pain Score: 9  Pain Location: Hip Pain Orientation: Right (radicular to toes)  Precautions/Restrictions     Exercise/Treatments   Aerobic Stationary Bike: Nustep hills 3; L 3 x9'   Supine Ab Set: 5 reps;Limitations (10sec holds) Clam: 10 reps Bent Knee Raise: 5 reps Straight Leg Raise: 5 reps    Prone  Straight Leg Raise: 10 reps Other Prone Lumbar Exercises: shoulder ext, rows, W-back     Stretches Passive Hamstring Stretch: 2 reps;20 seconds;Limitations Passive Hamstring Stretch Limitations: manual with gastroc stretch         Physical Therapy Assessment and Plan PT Assessment and Plan Clinical Impression Statement: Patient experiencing 9/10 R low back pain with radicular symptoms to toes and states she needs to shorten visit up today due to husband needing to get home. Continued with Nustep for activity tolerance followed by mat stretches and core strengthening. PT Plan: Pt unable to complete standing activity in good posture. Treatment at this point wil focus on strengthening core and back mm to allow pt to hold self upright for prolong period of time and Progress strengthenening activities to tolerance    Goals    Problem List Patient Active Problem List   Diagnosis Date Noted  . Chronic bronchitis 10/13/2013  . Dysphagia, unspecified(787.20) 08/18/2013  . Bilateral leg weakness 08/17/2013  . Poor balance 08/17/2013  . GERD with stricture 06/29/2013  . Seasonal allergies  06/29/2013  . Knee pain, left 05/27/2013  . Candidiasis of breast 04/09/2013  . Abnormal thyroid blood test 03/12/2013  . Carotid artery disease   . Multiple falls 02/15/2012  . Hand fracture 10/26/2011  . Routine general medical examination at a health care facility 09/30/2011  . Arthritis, shoulder region 05/02/2011  . Torn rotator cuff, left 05/02/2011  . Urinary incontinence 08/04/2010  . Ejection fraction   . Hyperlipidemia   . Aortic valve sclerosis   . Normal nuclear stress test   . Atrial flutter 01/28/2009  . MITRAL REGURGITATION, 0 (MILD) 09/28/2008  . OBESITY, UNSPECIFIED 09/12/2008  . GERD 05/11/2008  . DIVERTICULOSIS, COLON 05/11/2008  . CARCINOMA, BASAL CELL 04/30/2008  . EROSIVE ESOPHAGITIS 04/30/2008  . IRRITABLE BOWEL SYNDROME 04/30/2008  . HEMATOCHEZIA, HX OF 04/30/2008  . BACK PAIN WITH RADICULOPATHY 11/04/2007  . Diabetes mellitus with nephropathy 03/11/2007  . ANXIETY 03/11/2007  . DEPRESSION 03/11/2007  . Essential hypertension 03/11/2007  . OSTEOARTHRITIS 03/11/2007    PT - End of Session Activity Tolerance: Patient limited by fatigue;Patient limited by pain  GP    Teejay Meader, [place] 10/14/2013, 11:58 AM

## 2013-10-15 LAB — HM DIABETES EYE EXAM

## 2013-10-17 ENCOUNTER — Telehealth: Payer: Self-pay | Admitting: Family Medicine

## 2013-10-17 DIAGNOSIS — N183 Chronic kidney disease, stage 3 unspecified: Secondary | ICD-10-CM | POA: Insufficient documentation

## 2013-10-17 DIAGNOSIS — J329 Chronic sinusitis, unspecified: Secondary | ICD-10-CM | POA: Insufficient documentation

## 2013-10-17 DIAGNOSIS — M8580 Other specified disorders of bone density and structure, unspecified site: Secondary | ICD-10-CM | POA: Insufficient documentation

## 2013-10-17 NOTE — Assessment & Plan Note (Signed)
Improved with medication, continue same

## 2013-10-17 NOTE — Telephone Encounter (Signed)
Pls call the specialty clinic where reclast is administered IV for osteoporosis (pls clarify that this is still the [facility]) Request order form ( instructionds for admin) they need the info prior to accepting pt for administration, i need to see the form pls , before even referring pt. Or Since she has CKD I am seeing if she is a candidate that I would treat prior to proceeding further as far as even ordering the med Pls try and get form on Monday , so can be sorted out , thanks

## 2013-10-17 NOTE — Assessment & Plan Note (Signed)
Antibiotic course prescribed 

## 2013-10-17 NOTE — Assessment & Plan Note (Signed)
Controlled, no change in medication Patient advised to reduce carb and sweets, commit to regular physical activity, take meds as prescribed, test blood as directed, and attempt to lose weight, to improve blood sugar control.  

## 2013-10-17 NOTE — Assessment & Plan Note (Signed)
osteopenia with high fall risk, need to change to parenteral bisphosphonate due to stricture, will try and arrange annual reclast, pt does have CKD so will need to discuss with pharmacy

## 2013-10-17 NOTE — Assessment & Plan Note (Signed)
Slightly imprpovede. Pt to keep blood pressure and blood suagr controlled and avoid NSAIDS

## 2013-10-17 NOTE — Assessment & Plan Note (Signed)
Controlled, no change in medication  

## 2013-10-17 NOTE — Assessment & Plan Note (Signed)
Symptomatic x weeks, antibiotic course prescribed, hold on flu  Vaccine at this time

## 2013-10-17 NOTE — Assessment & Plan Note (Signed)
Fosamax d/c due to stricture, management by GI

## 2013-10-19 ENCOUNTER — Inpatient Hospital Stay (HOSPITAL_COMMUNITY)
Admission: RE | Admit: 2013-10-19 | Payer: Commercial Managed Care - HMO | Source: Ambulatory Visit | Admitting: Physical Therapy

## 2013-10-19 NOTE — Telephone Encounter (Signed)
Form on your desk  

## 2013-10-20 ENCOUNTER — Telehealth: Payer: Self-pay | Admitting: Family Medicine

## 2013-10-20 NOTE — Telephone Encounter (Signed)
This patient needs and passes the labs tests for reclast once yearly for her bones  I do not see that she is currently on calcium and vit D, pls call and tell her that she needs to be takin this daily before she can be treated. Give her the stated doses on the standard order form please Will hope to give infusion in November, let her know pls

## 2013-10-20 NOTE — Telephone Encounter (Signed)
Patient does take the required amount of calcium/vit d. Is aware we will be working on this. Waiting on new forms to be faxed over

## 2013-10-21 ENCOUNTER — Ambulatory Visit (HOSPITAL_COMMUNITY)
Admission: RE | Admit: 2013-10-21 | Discharge: 2013-10-21 | Disposition: A | Discharge status: Home / Self Care | Payer: Commercial Managed Care - HMO | Source: Ambulatory Visit | Attending: Family Medicine | Admitting: Family Medicine

## 2013-10-21 DIAGNOSIS — M545 Low back pain: Secondary | ICD-10-CM | POA: Diagnosis not present

## 2013-10-21 NOTE — Evaluation (Addendum)
Physical Therapy Re-Evaluation  Patient Details  Name: [name] [name] MRN: [id number] Date of Birth: 1929-05-02  Today's Date: 10/21/2013 Time: 1440-1520 PT Time Calculation (min): 40 min             Visit#: 9 of 12  Re-eval: 10/12/13 Charges:  Gait 1440-1450, MMT 1450-1500, self care [id number]  ASubjective Symptoms/Limitations Symptoms: Pt states she is overall feeling stronger, however continues to have pain in LB and radicular pain into bilateral feet.  Pt states she is still having difficulty walking tall and thinks it is due to her bilateral rotator cuff tears.  Pain Assessment Currently in Pain?: Yes Pain Score: 7  Pain Location: Hip Pain Orientation: Right Pain Radiating Towards: Rt ankle  Exercises:  Gait training with RW, upright posturing X 225 feet   Physical Therapy Assessment and Plan PT Assessment and Plan Clinical Impression Statement: Pt is overall improving in function and decreasing in pain.  Pt admits to not completeing HEP as she should and is not ambulating in her home unless she needs to.  Pt states she does not even bring her walker in her home and either uses several canes or "furniture walks" to get around.  Discussed with patinent and husband and is to get another walker for indoors to increase actvity level.  pt is progressing towards goals with overall improvment of 13 points for BERG balance test and increase of 1/2-1 grade in strength bilateral LE's. Pt would benefit from further therapy to continue to improve posture, balance and strength to increase functional mobility.  PT Plan: Recommend continuing PT  2 x a week X 4 more weeks.  Order sent to MD requesting new RW.   Treatment at this point will focus on strengthening core and back mm to allow pt to hold self upright for prolong period of time and Progress strengthenening activities to tolerance    Goals Home Exercise Program Pt/caregiver will Perform Home Exercise Program: For increased  strengthening PT Goal: Perform Home Exercise Program - Progress: Partly met PT Short Term Goals Time to Complete Short Term Goals: 2 weeks PT Short Term Goal 1: Pt to have had no falls PT Short Term Goal 1 - Progress: Met PT Short Term Goal 2: Pt to be ambulating with walker for at least five minute every day PT Short Term Goal 2 - Progress: Progressing toward goal PT Short Term Goal 3: Pt to only have 20 degree forward bend when ambulating.  PT Short Term Goal 3 - Progress: Progressing toward goal PT Short Term Goal 4: Berg score increased 5 pt to reduce risk of falling (from baseline of 22) PT Short Term Goal 4 - Progress: Met PT Long Term Goals Time to Complete Long Term Goals: 4 weeks PT Long Term Goal 1: Independent  in advance HEP PT Long Term Goal 1 - Progress: Progressing toward goal PT Long Term Goal 2: Pt to be ambulating with a walker for at least 10 minutes a day  PT Long Term Goal 2 - Progress: Progressing toward goal Long Term Goal 3: Pt to have a ten degree forward bend or less when ambulating with walker  Long Term Goal 3 Progress: Progressing toward goal Long Term Goal 4: Berg score increased by 10 to reduce risk of falling  Long Term Goal 4 Progress: Met PT Long Term Goal 5: Pt strength to be improved one grade to be able to come sit to stand on one attempt  Long Term Goal 5 Progress: Met  Problem List Patient Active Problem List   Diagnosis Date Noted  . Sinusitis, chronic 10/17/2013  . Osteopenia 10/17/2013  . CKD (chronic kidney disease) stage 3, GFR 30-59 ml/min 10/17/2013  . Chronic bronchitis 10/13/2013  . Dysphagia, unspecified(787.20) 08/18/2013  . Bilateral leg weakness 08/17/2013  . Poor balance 08/17/2013  . GERD with stricture 06/29/2013  . Seasonal allergies 06/29/2013  . Knee pain, left 05/27/2013  . Candidiasis of breast 04/09/2013  . Abnormal thyroid blood test 03/12/2013  . Carotid artery disease   . Multiple falls 02/15/2012  . Hand  fracture 10/26/2011  . Routine general medical examination at a health care facility 09/30/2011  . Arthritis, shoulder region 05/02/2011  . Torn rotator cuff, left 05/02/2011  . Urinary incontinence 08/04/2010  . Ejection fraction   . Hyperlipidemia   . Aortic valve sclerosis   . Normal nuclear stress test   . Atrial flutter 01/28/2009  . MITRAL REGURGITATION, 0 (MILD) 09/28/2008  . OBESITY, UNSPECIFIED 09/12/2008  . GERD 05/11/2008  . DIVERTICULOSIS, COLON 05/11/2008  . CARCINOMA, BASAL CELL 04/30/2008  . EROSIVE ESOPHAGITIS 04/30/2008  . IRRITABLE BOWEL SYNDROME 04/30/2008  . HEMATOCHEZIA, HX OF 04/30/2008  . BACK PAIN WITH RADICULOPATHY 11/04/2007  . Diabetes mellitus with nephropathy 03/11/2007  . ANXIETY 03/11/2007  . Anxiety and depression 03/11/2007  . Essential hypertension 03/11/2007  . OSTEOARTHRITIS 03/11/2007   PT - End of Session Activity Tolerance: Patient limited by fatigue;Patient limited by pain  GP Functional Assessment Tool Used: clinical judgement  Functional Limitation: Mobility: Walking and moving around Mobility: Walking and Moving Around Current Status (B9038): At least 80 percent but less than 100 percent impaired, limited or restricted Mobility: Walking and Moving Around Goal Status [phone]): At least 60 percent but less than 80 percent impaired, limited or restricted  Teena Irani, PTA/CLT 10/21/2013, 5:11 PM  Your signature is required to indicate approval of the treatment plan as stated above.  Please sign and return making a copy for your files.  You may hard copy or send electronically.  Please check one: ___1.  Approve of this plan  ___2.  Approve of this plan with the following changes.   ____________________________                             _____________ Physician                                                                      Date

## 2013-10-25 NOTE — Assessment & Plan Note (Signed)
Controlled, no change in medication Improved 

## 2013-10-25 NOTE — Assessment & Plan Note (Signed)
Worsened pain  S/p fall needs xray, also due to worsened instability and multiple falls , re val through PT may need PWC Fall safety and reduction in the home is discussed

## 2013-10-25 NOTE — Assessment & Plan Note (Signed)
Controlled, no change in medication Patient advised to reduce carb and sweets, commit to regular physical activity, take meds as prescribed, test blood as directed, and attempt to lose weight, to improve blood sugar control.  

## 2013-10-25 NOTE — Assessment & Plan Note (Signed)
Uncontrolled, daily use of meds and saline flushes to be started

## 2013-10-25 NOTE — Assessment & Plan Note (Signed)
Controlled, no change in medication DASH diet and commitment to daily physical activity for a minimum of 30 minutes discussed and encouraged, as a part of hypertension management. The importance of attaining a healthy weight is also discussed.  

## 2013-10-25 NOTE — Assessment & Plan Note (Signed)
Controlled, no change in medication Hyperlipidemia:Low fat diet discussed and encouraged.  \ 

## 2013-10-26 ENCOUNTER — Ambulatory Visit (HOSPITAL_COMMUNITY): Payer: Commercial Managed Care - HMO | Admitting: Physical Therapy

## 2013-10-28 ENCOUNTER — Ambulatory Visit (HOSPITAL_COMMUNITY)
Admission: RE | Admit: 2013-10-28 | Payer: Commercial Managed Care - HMO | Source: Ambulatory Visit | Attending: Family Medicine | Admitting: Family Medicine

## 2013-10-28 ENCOUNTER — Telehealth (HOSPITAL_COMMUNITY): Payer: Self-pay

## 2013-10-28 NOTE — Telephone Encounter (Signed)
cx all appts.... [name] [name] it was left up to her if she wanted to return or not

## 2013-10-29 ENCOUNTER — Ambulatory Visit (INDEPENDENT_AMBULATORY_CARE_PROVIDER_SITE_OTHER): Payer: Medicare HMO

## 2013-10-29 ENCOUNTER — Ambulatory Visit (INDEPENDENT_AMBULATORY_CARE_PROVIDER_SITE_OTHER): Payer: Commercial Managed Care - HMO | Admitting: Otolaryngology

## 2013-10-29 ENCOUNTER — Encounter (INDEPENDENT_AMBULATORY_CARE_PROVIDER_SITE_OTHER): Payer: Self-pay

## 2013-10-29 DIAGNOSIS — Z23 Encounter for immunization: Secondary | ICD-10-CM

## 2013-10-29 DIAGNOSIS — K219 Gastro-esophageal reflux disease without esophagitis: Secondary | ICD-10-CM

## 2013-10-29 DIAGNOSIS — R1312 Dysphagia, oropharyngeal phase: Secondary | ICD-10-CM

## 2013-10-30 ENCOUNTER — Ambulatory Visit (HOSPITAL_COMMUNITY): Payer: Commercial Managed Care - HMO

## 2013-11-05 ENCOUNTER — Other Ambulatory Visit: Payer: Self-pay

## 2013-11-05 MED ORDER — OXYCODONE-ACETAMINOPHEN 10-325 MG PO TABS
ORAL_TABLET | ORAL | Status: DC
Start: 2013-11-05 — End: 2013-12-03

## 2013-11-09 ENCOUNTER — Ambulatory Visit: Payer: Medicare HMO | Admitting: Gastroenterology

## 2013-11-20 ENCOUNTER — Encounter (HOSPITAL_COMMUNITY)
Admission: RE | Admit: 2013-11-20 | Discharge: 2013-11-20 | Disposition: A | Discharge status: Home / Self Care | Payer: Commercial Managed Care - HMO | Source: Ambulatory Visit | Attending: Family Medicine | Admitting: Family Medicine

## 2013-11-20 DIAGNOSIS — Z9181 History of falling: Secondary | ICD-10-CM | POA: Insufficient documentation

## 2013-11-20 DIAGNOSIS — M949 Disorder of cartilage, unspecified: Secondary | ICD-10-CM | POA: Diagnosis not present

## 2013-11-20 DIAGNOSIS — M899 Disorder of bone, unspecified: Secondary | ICD-10-CM | POA: Insufficient documentation

## 2013-11-20 LAB — COMPREHENSIVE METABOLIC PANEL
ALT: 15 U/L (ref 0–35)
AST: 24 U/L (ref 0–37)
Albumin: 3.8 g/dL (ref 3.5–5.2)
Alkaline Phosphatase: 35 U/L — ABNORMAL LOW (ref 39–117)
Anion gap: 12 (ref 5–15)
BILIRUBIN TOTAL: 0.2 mg/dL — AB (ref 0.3–1.2)
BUN: 30 mg/dL — ABNORMAL HIGH (ref 6–23)
CALCIUM: 9.9 mg/dL (ref 8.4–10.5)
CO2: 29 meq/L (ref 19–32)
Chloride: 98 mEq/L (ref 96–112)
Creatinine, Ser: 1.18 mg/dL — ABNORMAL HIGH (ref 0.50–1.10)
GFR calc Af Amer: 48 mL/min — ABNORMAL LOW (ref >90)
GFR, EST NON AFRICAN AMERICAN: 42 mL/min — AB (ref >90)
GLUCOSE: 219 mg/dL — AB (ref 70–99)
Potassium: 5 mEq/L (ref 3.7–5.3)
SODIUM: 139 meq/L (ref 137–147)
Total Protein: 7.3 g/dL (ref 6.0–8.3)

## 2013-11-20 MED ORDER — ZOLEDRONIC ACID 5 MG/100ML IV SOLN
INTRAVENOUS | Status: AC
  Filled 2013-11-20: qty 100

## 2013-11-20 MED ORDER — ZOLEDRONIC ACID 5 MG/100ML IV SOLN
5.0000 mg | Freq: Once | INTRAVENOUS | Status: AC
  Administered 2013-11-20: 5 mg via INTRAVENOUS

## 2013-11-20 MED ORDER — SODIUM CHLORIDE 0.9 % IV SOLN: INTRAVENOUS | Status: DC

## 2013-11-30 ENCOUNTER — Other Ambulatory Visit: Payer: Self-pay | Admitting: Family Medicine

## 2013-12-03 ENCOUNTER — Other Ambulatory Visit: Payer: Self-pay

## 2013-12-03 ENCOUNTER — Telehealth: Payer: Self-pay | Admitting: *Deleted

## 2013-12-03 MED ORDER — OXYCODONE-ACETAMINOPHEN 10-325 MG PO TABS: ORAL_TABLET | ORAL | Status: DC

## 2013-12-03 NOTE — Telephone Encounter (Signed)
Just wait and ensure rinses hands thoroughly need to get the drano off!

## 2013-12-03 NOTE — Telephone Encounter (Signed)
Also- since the reclast infusion her fasting sugars have been too low. This week they were 70, 66 57 86 68.  Takes all her diabetes meds as they are listed on her med list, Glipizide, lantus and metformin. Please advise

## 2013-12-03 NOTE — Telephone Encounter (Signed)
States this am she used Drano max gel in her sink and didn't use gloves because she wasn't thinking. She called poison control and they said to rinse for 15 mins with cold water and not to put lotion on her hands because it could cause breakdown since she was diabetic. Wants to know if there was anything else she needed to do or just wait and see if any sores come up. Please advise

## 2013-12-03 NOTE — Telephone Encounter (Signed)
pls advise reduce lantus to 7 units daily from 13 units

## 2013-12-03 NOTE — Telephone Encounter (Signed)
Pt called requesting to speak with Dr. Moshe Cipro I made pt aware that Dr. Moshe Cipro is seeing patient's and I can send a message to her nurse. Please advise [phone]. Pt did not tell me what this is regarding.

## 2013-12-07 ENCOUNTER — Other Ambulatory Visit: Payer: Self-pay

## 2013-12-07 MED ORDER — FENOFIBRATE 145 MG PO TABS: 145.0000 mg | ORAL_TABLET | Freq: Every day | ORAL | Status: DC

## 2013-12-07 MED ORDER — DILTIAZEM HCL ER 240 MG PO CP24: ORAL_CAPSULE | ORAL | Status: DC

## 2013-12-07 MED ORDER — BENAZEPRIL-HYDROCHLOROTHIAZIDE 20-12.5 MG PO TABS: 2.0000 | ORAL_TABLET | Freq: Every day | ORAL | Status: DC

## 2013-12-07 NOTE — Telephone Encounter (Signed)
Pt aware.

## 2013-12-10 ENCOUNTER — Ambulatory Visit (INDEPENDENT_AMBULATORY_CARE_PROVIDER_SITE_OTHER): Payer: Commercial Managed Care - HMO | Admitting: Otolaryngology

## 2013-12-10 DIAGNOSIS — R1312 Dysphagia, oropharyngeal phase: Secondary | ICD-10-CM

## 2013-12-10 DIAGNOSIS — K219 Gastro-esophageal reflux disease without esophagitis: Secondary | ICD-10-CM

## 2013-12-14 ENCOUNTER — Telehealth: Payer: Self-pay

## 2013-12-14 NOTE — Telephone Encounter (Signed)
Script from drawer

## 2013-12-14 NOTE — Telephone Encounter (Signed)
Noted.   Script needed

## 2013-12-15 ENCOUNTER — Telehealth: Payer: Self-pay

## 2013-12-15 ENCOUNTER — Ambulatory Visit (INDEPENDENT_AMBULATORY_CARE_PROVIDER_SITE_OTHER): Payer: Commercial Managed Care - HMO | Admitting: Gastroenterology

## 2013-12-15 ENCOUNTER — Other Ambulatory Visit: Payer: Self-pay

## 2013-12-15 ENCOUNTER — Encounter: Payer: Self-pay | Admitting: Internal Medicine

## 2013-12-15 ENCOUNTER — Encounter: Payer: Self-pay | Admitting: Gastroenterology

## 2013-12-15 VITALS — BP 113/64 | HR 66 | Temp 97.6°F | Ht 62.0 in | Wt 149.4 lb

## 2013-12-15 DIAGNOSIS — R634 Abnormal weight loss: Secondary | ICD-10-CM

## 2013-12-15 MED ORDER — PANTOPRAZOLE SODIUM 40 MG PO TBEC: 40.0000 mg | DELAYED_RELEASE_TABLET | Freq: Every day | ORAL | Status: DC

## 2013-12-15 NOTE — Progress Notes (Addendum)
Referring Provider: Fayrene Helper, MD Primary Care Physician:  Tula Nakayama, MD  Primary GI: Dr. Gala Romney   Chief Complaint  Patient presents with  . Follow-up    HPI:   [name] [name] presents today in follow-up with history of dysphagia and early satiety. EGD recently completed with Schatzki's ring s/p dilation.  Weight loss noted. Unintentional. Nexium once a day. Just occasional reflux. Will burp and then have a very bitter taste. Will take Tums if needed. Prior history of taking omeprazole, now on Nexium. Dysphagia improved. Saw Dr. Melene Plan last week. Doesn't have much of an appetite. No abdominal pain with eating. Rare nausea, not often. Spits up phlegm sometimes. DOWN 18 lbs since Aug 2015, unintentionally.    Past Medical History  Diagnosis Date  . Atrial flutter febuary 2008       atrial flutter... DC Cardioversion...successful... holding sinus as of january 2011 coumadin therapy...discontinued...severe hematoma secondary to fall  . Hypertension   . Hyperlipidemia   . Hypercholesterolemia   . Mitral regurgitation 2010    mild ...echo...2010  . Obesity   . Sleep apnea   . Fatigue   . Depression with anxiety   . Visual loss     unspecified  . Basal cell carcinoma   . Hematochezia   . Hematoma     ...buttocks secondary to fall november 2009 with coumadin  coumadin stopped  . Diabetes mellitus     type 2  . Back pain     with radiculopathy  . Arthritis   . Osteoarthritis   . Acute cystitis   . GERD (gastroesophageal reflux disease)   . Diverticulosis of colon   . Constipation   . IBS (irritable bowel syndrome)   . Abdominal bloating   . Nausea   . Erosive esophagitis   . Aortic valve sclerosis     Ef 60 %...echo...july 2010   . Chronic granulomatous disease     right chest ... ct chest... novmber 2009  . Normal nuclear stress test     september ,2007...no ischemia  . Anticoagulant long-term use     Coumadin  stopped...severe hematoma  from fall  .  Edema     Painful, April, 2012  . Ejection fraction     EF 60-65%, echo, April 13, 2010, normal RV function, mild mitral regurgitation  . Carotid artery disease     Doppler, [facility], February 21, 2012, less than 50% bilateral carotid stenoses    Past Surgical History  Procedure Laterality Date  . Bilateral bunion removel  [id number]  . Replacement total knee bilateral      2000,2001  . Tonsillectomy  1957  . Appendectomy  1948  . Dilation and curettage of uterus  1952 and 2001  . Abdominal hysterectomy  2001  . Cataract extraction  2001    right   . Skin cancer excision    . Cataract extraction      left 2005  . Basal cell carcinoma excision  09/2009    right forarm and back of neck and left side   . Colonoscopy   07/17/2006    Dr. Rourk:anal canal hemorrhoids, diminutive rectal polyp status post cold biopsy.  Otherwise, normal rectum. Left sided and  transverse diverticula.  Diminutive polyp in the ascending colon, cold bx. hyperplastic polyps.   . Esophagogastroduodenoscopy   07/17/2006    Dr. Rourk:single 2 cm distal esophageal erosion consistent with erosive reflux esophagitis, otherwise normal  . Esophagogastroduodenoscopy N/A 09/10/2013  Dr. Rourk:Schatzki's ring - status post [facility] dilation. Mild erosive reflux esophagitis. Hiatal hernia with fosamax capsule lodged as above  . Savory dilation N/A 09/10/2013    Procedure: SAVORY DILATION;  Surgeon: Daneil Dolin, MD;  Location: [facility];  Service: Endoscopy;  Laterality: N/A;  Venia Minks dilation N/A 09/10/2013    Procedure: Venia Minks DILATION;  Surgeon: Daneil Dolin, MD;  Location: [facility];  Service: Endoscopy;  Laterality: N/A;    Current Outpatient Prescriptions  Medication Sig Dispense Refill  . aspirin 81 MG tablet Take 81 mg by mouth daily.      . benazepril-hydrochlorthiazide (LOTENSIN HCT) 20-12.5 MG per tablet Take 2 tablets by mouth daily. 180 tablet 1  . citalopram (CELEXA) 40 MG tablet  TAKE 1 TABLET BY MOUTH EVERY DAY 30 tablet 5  . CRESTOR 40 MG tablet TAKE 1 TABLET BY MOUTH EVERY DAY 30 tablet 2  . diltiazem (DILT-XR) 240 MG 24 hr capsule TAKE 1 CAPSULE BY MOUTH DAILY. 90 capsule 1  . esomeprazole (NEXIUM) 40 MG capsule Take 40 mg by mouth daily at 12 noon.    . fenofibrate (TRICOR) 145 MG tablet Take 1 tablet (145 mg total) by mouth at bedtime. 90 tablet 1  . fesoterodine (TOVIAZ) 8 MG TB24 tablet Take 1 tablet (8 mg total) by mouth daily. 30 tablet 5  . fish oil-omega-3 fatty acids 1000 MG capsule Take 1 g by mouth daily. 2 caps bid     . fluticasone (FLONASE) 50 MCG/ACT nasal spray Place 2 sprays into both nostrils daily.    . furosemide (LASIX) 20 MG tablet Take 20 mg by mouth.    . gabapentin (NEURONTIN) 100 MG capsule TAKE 1 CAPSULE BY MOUTH THREE TIMES DAILY 90 capsule 3  . GLIPIZIDE XL 10 MG 24 hr tablet Take 10 mg by mouth daily.    . Insulin Glargine (LANTUS) 100 UNIT/ML Solostar Pen Inject 13 Units into the skin daily.    Marland Kitchen L-Methylfolate-Algae-B12-B6 (METANX) 3-90.314-2-35 MG CAPS TAKE 1 CAPSULE BY MOUTH EVERY DAY 30 capsule 3  . metFORMIN (GLUCOPHAGE) 1000 MG tablet Take 1 tablet (1,000 mg total) by mouth 2 (two) times daily with a meal. 180 tablet 3  . nystatin (MYCOSTATIN/NYSTOP) 100000 UNIT/GM POWD Apply to affected area(s) twice daily for 1 week then as needed 60 g 2  . oxyCODONE-acetaminophen (PERCOCET) 10-325 MG per tablet One tablet four times daily for severe uncontrolled pain 120 tablet 0  . ranitidine (ZANTAC) 150 MG capsule Take 150 mg by mouth every evening.    . pantoprazole (PROTONIX) 40 MG tablet Take 1 tablet (40 mg total) by mouth daily. 30 tablet 3  . [DISCONTINUED] solifenacin (VESICARE) 10 MG tablet Take 1 tablet (10 mg total) by mouth daily. 30 tablet 5   No current facility-administered medications for this visit.    Allergies as of 12/15/2013 - Review Complete 12/15/2013  Allergen Reaction Noted  . Lisinopril Nausea And Vomiting   .  Losartan  04/08/2013  . Alprazolam Other (See Comments)   . Sertraline hcl Diarrhea 05/24/2008  . Sulfonamide derivatives Rash     Family History  Problem Relation Age of Onset  . Dementia Mother   . Heart disease Father   . Diabetes    . Arthritis    . Colon cancer Neg Hx     History   Social History  . Marital Status: Married    Spouse Name: N/A    Number of Children: N/A  .  Years of Education: 13   Occupational History  . retired    Social History Main Topics  . Smoking status: Never Smoker   . Smokeless tobacco: Never Used  . Alcohol Use: No  . Drug Use: No  . Sexual Activity: None   Other Topics Concern  . None   Social History Narrative    Review of Systems: Negative unless mentioned in HPI.   Physical Exam: BP 113/64 mmHg  Pulse 66  Temp(Src) 97.6 F (36.4 C) (Oral)  Ht 5\' 2"  (1.575 m)  Wt 149 lb 6.4 oz (67.767 kg)  BMI 27.32 kg/m2 General:   Alert and oriented. No distress noted. Pleasant and cooperative.  Head:  Normocephalic and atraumatic. Eyes:  Conjuctiva clear without scleral icterus. Mouth:  Oral mucosa pink and moist. Good dentition. No lesions. Heart:  S1, S2 present without murmurs, rubs, or gallops. Regular rate and rhythm. Abdomen:  +BS, soft, non-tender and non-distended. Large ventral hernia noted Msk:  kyphosis Extremities:  Without edema. Neurologic:  Alert and  oriented x4;  grossly normal neurologically. Skin:  Intact without significant lesions or rashes. Psych:  Alert and cooperative. Normal mood and affect.

## 2013-12-15 NOTE — Patient Instructions (Signed)
Please have blood work done today. We are checking your kidney function before the CT scan. I am also checking to see if you have any anemia.   Stop the metformin the day of the CT scan. Do not take for 2 days after the CT scan.   Return in 4 weeks.

## 2013-12-15 NOTE — Telephone Encounter (Signed)
Called pt to inform her of her CT Scan appointment. Pt aware that she needs to pick up contrast on 12/18/2013 from radiology and her scan is on 12/21/2013 @ 215pm. Informed pt only fluids up until 10:00 am that day.     PA # via [employer] is [phone]

## 2013-12-16 LAB — CBC
HCT: 33.9 % — ABNORMAL LOW (ref 36.0–46.0)
Hemoglobin: 11.5 g/dL — ABNORMAL LOW (ref 12.0–15.0)
MCH: 29.9 pg (ref 26.0–34.0)
MCHC: 33.9 g/dL (ref 30.0–36.0)
MCV: 88.1 fL (ref 78.0–100.0)
MPV: 10.4 fL (ref 9.4–12.4)
Platelets: 278 10*3/uL (ref 150–400)
RBC: 3.85 MIL/uL — ABNORMAL LOW (ref 3.87–5.11)
RDW: 14.1 % (ref 11.5–15.5)
WBC: 9.6 10*3/uL (ref 4.0–10.5)

## 2013-12-16 LAB — BASIC METABOLIC PANEL
BUN: 33 mg/dL — AB (ref 6–23)
CO2: 29 mEq/L (ref 19–32)
Calcium: 10.1 mg/dL (ref 8.4–10.5)
Chloride: 96 mEq/L (ref 96–112)
Creat: 1.65 mg/dL — ABNORMAL HIGH (ref 0.50–1.10)
Glucose, Bld: 111 mg/dL — ABNORMAL HIGH (ref 70–99)
POTASSIUM: 4.7 meq/L (ref 3.5–5.3)
SODIUM: 137 meq/L (ref 135–145)

## 2013-12-21 ENCOUNTER — Ambulatory Visit (HOSPITAL_COMMUNITY)
Admission: RE | Admit: 2013-12-21 | Discharge: 2013-12-21 | Disposition: A | Discharge status: Home / Self Care | Payer: Commercial Managed Care - HMO | Source: Ambulatory Visit | Attending: Gastroenterology | Admitting: Gastroenterology

## 2013-12-21 ENCOUNTER — Other Ambulatory Visit: Payer: Self-pay | Admitting: Gastroenterology

## 2013-12-21 ENCOUNTER — Encounter: Payer: Self-pay | Admitting: Gastroenterology

## 2013-12-21 DIAGNOSIS — Q438 Other specified congenital malformations of intestine: Secondary | ICD-10-CM | POA: Insufficient documentation

## 2013-12-21 DIAGNOSIS — J984 Other disorders of lung: Secondary | ICD-10-CM | POA: Diagnosis not present

## 2013-12-21 DIAGNOSIS — R634 Abnormal weight loss: Secondary | ICD-10-CM | POA: Diagnosis present

## 2013-12-21 DIAGNOSIS — M6258 Muscle wasting and atrophy, not elsewhere classified, other site: Secondary | ICD-10-CM | POA: Diagnosis not present

## 2013-12-21 DIAGNOSIS — K868 Other specified diseases of pancreas: Secondary | ICD-10-CM | POA: Diagnosis not present

## 2013-12-21 DIAGNOSIS — Z90710 Acquired absence of both cervix and uterus: Secondary | ICD-10-CM | POA: Diagnosis not present

## 2013-12-21 DIAGNOSIS — R109 Unspecified abdominal pain: Secondary | ICD-10-CM | POA: Diagnosis present

## 2013-12-21 DIAGNOSIS — I771 Stricture of artery: Secondary | ICD-10-CM | POA: Insufficient documentation

## 2013-12-21 DIAGNOSIS — I700 Atherosclerosis of aorta: Secondary | ICD-10-CM | POA: Diagnosis not present

## 2013-12-21 DIAGNOSIS — K449 Diaphragmatic hernia without obstruction or gangrene: Secondary | ICD-10-CM | POA: Insufficient documentation

## 2013-12-21 NOTE — Assessment & Plan Note (Signed)
80 year old female with persistent, unintentional weight loss. Concern for occult malignancy. Last colonoscopy 2008; no lower GI symptoms of concern. EGD on file with dilation. Change to Protonix once daily and proceed with CT scan now.

## 2013-12-22 NOTE — Progress Notes (Signed)
Quick Note:  Also notable, mild normocytic anemia. Let's check iron and ferritin. If evidence of IDA, would recommend updated colonoscopy. ______

## 2013-12-22 NOTE — Progress Notes (Signed)
Quick Note:  Cr worsened. Will copy Dr. Moshe Cipro for review.  CT without evidence of occult malignancy. Keep appt upcoming in Jan 2016.  Add ensure BID to dietary regimen. ______

## 2013-12-29 ENCOUNTER — Other Ambulatory Visit: Payer: Self-pay | Admitting: Gastroenterology

## 2013-12-29 ENCOUNTER — Other Ambulatory Visit: Payer: Self-pay

## 2013-12-29 ENCOUNTER — Telehealth: Payer: Self-pay | Admitting: Family Medicine

## 2013-12-29 DIAGNOSIS — D649 Anemia, unspecified: Secondary | ICD-10-CM

## 2013-12-29 NOTE — Telephone Encounter (Signed)
Pls contact pt and let her know needs to stop metformin as creatinine is too high, will need to use only insulin and glipizide, I am d/c metformin from med list, Very important that she understands

## 2013-12-29 NOTE — Progress Notes (Signed)
cc'ed to pcp °

## 2013-12-30 ENCOUNTER — Other Ambulatory Visit: Payer: Self-pay | Admitting: Family Medicine

## 2013-12-30 ENCOUNTER — Other Ambulatory Visit: Payer: Self-pay

## 2013-12-30 MED ORDER — GLIPIZIDE ER 10 MG PO TB24: 10.0000 mg | ORAL_TABLET | Freq: Every day | ORAL | Status: DC

## 2013-12-30 NOTE — Telephone Encounter (Signed)
Pt aware and note sent to the pharmacy to DC the med and patient removed it from her med bag while I was on the phone

## 2014-01-04 ENCOUNTER — Other Ambulatory Visit: Payer: Self-pay | Admitting: Family Medicine

## 2014-01-07 ENCOUNTER — Other Ambulatory Visit: Payer: Self-pay

## 2014-01-07 MED ORDER — OXYCODONE-ACETAMINOPHEN 10-325 MG PO TABS: ORAL_TABLET | ORAL | Status: DC

## 2014-01-15 LAB — FERRITIN: Ferritin: 132 ng/mL (ref 10–291)

## 2014-01-15 LAB — IRON: IRON: 79 ug/dL (ref 42–145)

## 2014-01-18 ENCOUNTER — Encounter: Payer: Self-pay | Admitting: Gastroenterology

## 2014-01-18 ENCOUNTER — Ambulatory Visit (INDEPENDENT_AMBULATORY_CARE_PROVIDER_SITE_OTHER): Payer: Commercial Managed Care - HMO | Admitting: Gastroenterology

## 2014-01-18 VITALS — BP 143/78 | HR 74 | Temp 97.9°F | Ht 62.0 in | Wt 164.6 lb

## 2014-01-18 DIAGNOSIS — R634 Abnormal weight loss: Secondary | ICD-10-CM

## 2014-01-18 NOTE — Patient Instructions (Signed)
Continue to take the protein shake once to twice a day.   Please call me if anything changes.   I will see you in 3 months!

## 2014-01-20 NOTE — Assessment & Plan Note (Signed)
Much improved and close to baseline now with addition of ensure supplementation. CT negative for occult malignancy. Weight loss multifactorial in the setting of decreased oral intake secondary to dysphagia (which is now resolved) and possibly age-related changes. Will have her return in 3 months for a weight check.

## 2014-01-20 NOTE — Progress Notes (Signed)
cc'ed to pcp °

## 2014-01-20 NOTE — Progress Notes (Signed)
Referring Provider: Fayrene Helper, MD Primary Care Physician:  Tula Nakayama, MD  Primary GI: Dr. Gala Romney   Chief Complaint  Patient presents with  . Weight Loss    had recent labs done also    HPI:   [name] [name] is a 81 y.o. female presenting today with a history of  dysphagia and early satiety. EGD recently completed with Schatzki's ring s/p dilation. Last seen Dec 2015 with improvement in dysphagia. Due to significant weight loss noted (18 lbs since Aug 2015 at that appt), CT abd/pelvis completed and unremarkable. She returns today with a 15 lbs weight gain. Using ensure BID. No hematochezia, melena. Appetite "so/so". Takes stool softeners daily for constipation and a laxative few times a week; pleased with this. No dysphagia. Complains of right shoulder and right hip discomfort but no limited range of motion.   Past Medical History  Diagnosis Date  . Atrial flutter febuary 2008       atrial flutter... DC Cardioversion...successful... holding sinus as of january 2011 coumadin therapy...discontinued...severe hematoma secondary to fall  . Hypertension   . Hyperlipidemia   . Hypercholesterolemia   . Mitral regurgitation 2010    mild ...echo...2010  . Obesity   . Sleep apnea   . Fatigue   . Depression with anxiety   . Visual loss     unspecified  . Basal cell carcinoma   . Hematochezia   . Hematoma     ...buttocks secondary to fall november 2009 with coumadin  coumadin stopped  . Diabetes mellitus     type 2  . Back pain     with radiculopathy  . Arthritis   . Osteoarthritis   . Acute cystitis   . GERD (gastroesophageal reflux disease)   . Diverticulosis of colon   . Constipation   . IBS (irritable bowel syndrome)   . Abdominal bloating   . Nausea   . Erosive esophagitis   . Aortic valve sclerosis     Ef 60 %...echo...july 2010   . Chronic granulomatous disease     right chest ... ct chest... novmber 2009  . Normal nuclear stress test     september  ,2007...no ischemia  . Anticoagulant long-term use     Coumadin  stopped...severe hematoma  from fall  . Edema     Painful, April, 2012  . Ejection fraction     EF 60-65%, echo, April 13, 2010, normal RV function, mild mitral regurgitation  . Carotid artery disease     Doppler, [facility], February 21, 2012, less than 50% bilateral carotid stenoses    Past Surgical History  Procedure Laterality Date  . Bilateral bunion removel  [id number]  . Replacement total knee bilateral      2000,2001  . Tonsillectomy  1957  . Appendectomy  1948  . Dilation and curettage of uterus  1952 and 2001  . Abdominal hysterectomy  2001  . Cataract extraction  2001    right   . Skin cancer excision    . Cataract extraction      left 2005  . Basal cell carcinoma excision  09/2009    right forarm and back of neck and left side   . Colonoscopy   07/17/2006    Dr. Rourk:anal canal hemorrhoids, diminutive rectal polyp status post cold biopsy.  Otherwise, normal rectum. Left sided and  transverse diverticula.  Diminutive polyp in the ascending colon, cold bx. hyperplastic polyps.   . Esophagogastroduodenoscopy   07/17/2006  Dr. Malva Limes 2 cm distal esophageal erosion consistent with erosive reflux esophagitis, otherwise normal  . Esophagogastroduodenoscopy N/A 09/10/2013    Dr. Rourk:Schatzki's ring - status post Venia Minks dilation. Mild erosive reflux esophagitis. Hiatal hernia with fosamax capsule lodged as above  . Savory dilation N/A 09/10/2013    Procedure: SAVORY DILATION;  Surgeon: Daneil Dolin, MD;  Location: [facility];  Service: Endoscopy;  Laterality: N/A;  Venia Minks dilation N/A 09/10/2013    Procedure: Venia Minks DILATION;  Surgeon: Daneil Dolin, MD;  Location: [facility];  Service: Endoscopy;  Laterality: N/A;    Current Outpatient Prescriptions  Medication Sig Dispense Refill  . aspirin 81 MG tablet Take 81 mg by mouth daily.      . benazepril-hydrochlorthiazide (LOTENSIN  HCT) 20-12.5 MG per tablet Take 2 tablets by mouth daily. 180 tablet 1  . citalopram (CELEXA) 40 MG tablet TAKE 1 TABLET BY MOUTH EVERY DAY 30 tablet 5  . CRESTOR 40 MG tablet TAKE 1 TABLET BY MOUTH EVERY DAY 30 tablet 2  . diltiazem (DILT-XR) 240 MG 24 hr capsule TAKE 1 CAPSULE BY MOUTH DAILY. 90 capsule 1  . fenofibrate (TRICOR) 145 MG tablet Take 1 tablet (145 mg total) by mouth at bedtime. 90 tablet 1  . fesoterodine (TOVIAZ) 8 MG TB24 tablet Take 1 tablet (8 mg total) by mouth daily. 30 tablet 5  . fish oil-omega-3 fatty acids 1000 MG capsule Take 1 g by mouth daily. 2 caps bid     . fluticasone (FLONASE) 50 MCG/ACT nasal spray Place 2 sprays into both nostrils daily.    . furosemide (LASIX) 20 MG tablet Take 20 mg by mouth.    . gabapentin (NEURONTIN) 100 MG capsule TAKE 1 CAPSULE BY MOUTH THREE TIMES DAILY 90 capsule 3  . glipiZIDE (GLIPIZIDE XL) 10 MG 24 hr tablet Take 1 tablet (10 mg total) by mouth daily. 90 tablet 1  . Insulin Glargine (LANTUS) 100 UNIT/ML Solostar Pen Inject 10 Units into the skin daily.     Marland Kitchen L-Methylfolate-Algae-B12-B6 (METANX) 3-90.314-2-35 MG CAPS TAKE 1 CAPSULE BY MOUTH EVERY DAY 30 capsule 3  . nystatin (MYCOSTATIN/NYSTOP) 100000 UNIT/GM POWD Apply to affected area(s) twice daily for 1 week then as needed 60 g 2  . oxyCODONE-acetaminophen (PERCOCET) 10-325 MG per tablet One tablet four times daily for severe uncontrolled pain 120 tablet 0  . pantoprazole (PROTONIX) 40 MG tablet Take 1 tablet (40 mg total) by mouth daily. 30 tablet 3  . ranitidine (ZANTAC) 150 MG capsule Take 150 mg by mouth every evening.    Marland Kitchen esomeprazole (NEXIUM) 40 MG capsule Take 40 mg by mouth daily at 12 noon.    . [DISCONTINUED] solifenacin (VESICARE) 10 MG tablet Take 1 tablet (10 mg total) by mouth daily. 30 tablet 5   No current facility-administered medications for this visit.    Allergies as of 01/18/2014 - Review Complete 01/18/2014  Allergen Reaction Noted  . Lisinopril  Nausea And Vomiting   . Losartan  04/08/2013  . Alprazolam Other (See Comments)   . Sertraline hcl Diarrhea 05/24/2008  . Sulfonamide derivatives Rash     Family History  Problem Relation Age of Onset  . Dementia Mother   . Heart disease Father   . Diabetes    . Arthritis    . Colon cancer Neg Hx     History   Social History  . Marital Status: Married    Spouse Name: N/A    Number of Children:  N/A  . Years of Education: 67   Occupational History  . retired    Social History Main Topics  . Smoking status: Never Smoker   . Smokeless tobacco: Never Used  . Alcohol Use: No  . Drug Use: No  . Sexual Activity: None   Other Topics Concern  . None   Social History Narrative    Review of Systems: As mentioned in HPI  Physical Exam: BP 143/78 mmHg  Pulse 74  Temp(Src) 97.9 F (36.6 C)  Ht 5\' 2"  (1.575 m)  Wt 164 lb 9.6 oz (74.662 kg)  BMI 30.10 kg/m2 General:   Alert and oriented. No distress noted. Pleasant and cooperative.  Head:  Normocephalic and atraumatic. Abdomen:  +BS, soft, non-tender and non-distended. No rebound or guarding. No HSM or masses noted. Msk:  Severe kyphosis Extremities:  Without edema. Neurologic:  Alert and  oriented x4;  grossly normal neurologically. Psych:  Alert and cooperative. Normal mood and affect.

## 2014-01-23 NOTE — Assessment & Plan Note (Signed)
Acute flare , uncontrolled pain, toradol in office amd depo medrol followed by short course of prednisone, pt to start gabapentin which she has used successfully in the past

## 2014-01-23 NOTE — Assessment & Plan Note (Signed)
Acute flare and recent trauma , need sx ray to fully evaluate, anti inflammatory burst Im and orally

## 2014-01-23 NOTE — Progress Notes (Signed)
   Subjective:    Patient ID: [name] [name], female    DOB: 1929-07-20, 81 y.o.   MRN: [id number]  HPI C/o increased and uncontrolled low back pain radiating down both legs, worse in the past 2 weeks. Denies any new symptoms of incontinence or lower extremity numbness Wants relief with additional pan med also injections in office to help with pain Notes increase inblood sugar levels since pain onset   Review of Systems See HPI Denies recent fever or chills. Denies sinus pressure, nasal congestion, ear pain or sore throat. Denies chest congestion, productive cough or wheezing. Denies chest pains, palpitations and leg swelling Denies abdominal pain, nausea, vomiting,diarrhea or constipation.   Denies headaches, seizures, numbness, or tingling. .        Objective:   Physical Exam BP 134/76 mmHg  Pulse 66  Resp 18  Ht 5\' 2"  (1.575 m)  SpO2 96% Patient alert and oriented and in no cardiopulmonary distress.Pt in pain, unable to stand at today's visit  HEENT: No facial asymmetry, EOMI,   oropharynx pink and moist.  Neck supple no JVD, no mass.  Chest: Clear to auscultation bilaterally.  CVS: S1, S2 no murmurs, no S3.Regular rate.  ABD: Soft non tender.   Ext: Markedly decreased  ROM lumbosacral spine,  Adequate in shoulders,   Skin: Intact, no ulcerations or rash noted.  Psych: Good eye contact, normal affect. Memory intact not anxious or depressed appearing.  CNS: CN 2-12 intact, power,  normal throughout.no focal deficits noted.        Assessment & Plan:  Back pain of lumbar region with sciatica Acute flare , uncontrolled pain, toradol in office amd depo medrol followed by short course of prednisone, pt to start gabapentin which she has used successfully in the past   Essential hypertension Controlled, no change in medication    Diabetes mellitus with nephropathy Adequate control, though recent spike in  Blood sugar values is readily explained by her  uncontrolled pain   Knee pain, left Acute flare and recent trauma , need sx ray to fully evaluate, anti inflammatory burst Im and orally

## 2014-01-23 NOTE — Assessment & Plan Note (Signed)
Controlled, no change in medication  

## 2014-01-23 NOTE — Assessment & Plan Note (Signed)
Adequate control, though recent spike in  Blood sugar values is readily explained by her uncontrolled pain

## 2014-01-25 ENCOUNTER — Ambulatory Visit: Payer: Commercial Managed Care - HMO | Admitting: Family Medicine

## 2014-01-25 NOTE — Progress Notes (Signed)
Quick Note:  Iron, ferritin normal. No evidence of IDA. Will continue to monitor serially. ______

## 2014-01-29 LAB — COMPLETE METABOLIC PANEL WITH GFR
ALT: 13 U/L (ref 0–35)
AST: 18 U/L (ref 0–37)
Albumin: 4.1 g/dL (ref 3.5–5.2)
Alkaline Phosphatase: 35 U/L — ABNORMAL LOW (ref 39–117)
BUN: 46 mg/dL — ABNORMAL HIGH (ref 6–23)
CALCIUM: 9.6 mg/dL (ref 8.4–10.5)
CO2: 28 mEq/L (ref 19–32)
CREATININE: 1.95 mg/dL — AB (ref 0.50–1.10)
Chloride: 101 mEq/L (ref 96–112)
GFR, EST NON AFRICAN AMERICAN: 23 mL/min — AB
GFR, Est African American: 27 mL/min — ABNORMAL LOW
Glucose, Bld: 132 mg/dL — ABNORMAL HIGH (ref 70–99)
Potassium: 4.7 mEq/L (ref 3.5–5.3)
Sodium: 142 mEq/L (ref 135–145)
TOTAL PROTEIN: 6.7 g/dL (ref 6.0–8.3)
Total Bilirubin: 0.4 mg/dL (ref 0.2–1.2)

## 2014-01-29 LAB — LIPID PANEL
Cholesterol: 168 mg/dL (ref 0–200)
HDL: 71 mg/dL (ref >39)
LDL CALC: 61 mg/dL (ref 0–99)
TRIGLYCERIDES: 182 mg/dL — AB (ref <150)
Total CHOL/HDL Ratio: 2.4 Ratio
VLDL: 36 mg/dL (ref 0–40)

## 2014-01-29 LAB — HEMOGLOBIN A1C
Hgb A1c MFr Bld: 8.5 % — ABNORMAL HIGH (ref <5.7)
MEAN PLASMA GLUCOSE: 197 mg/dL — AB (ref <117)

## 2014-02-01 ENCOUNTER — Encounter: Payer: Self-pay | Admitting: Family Medicine

## 2014-02-01 ENCOUNTER — Ambulatory Visit (INDEPENDENT_AMBULATORY_CARE_PROVIDER_SITE_OTHER): Payer: Commercial Managed Care - HMO | Admitting: Family Medicine

## 2014-02-01 VITALS — BP 124/66 | HR 62 | Resp 18 | Ht 62.0 in | Wt 168.1 lb

## 2014-02-01 DIAGNOSIS — K219 Gastro-esophageal reflux disease without esophagitis: Secondary | ICD-10-CM

## 2014-02-01 DIAGNOSIS — M545 Low back pain: Secondary | ICD-10-CM

## 2014-02-01 DIAGNOSIS — E785 Hyperlipidemia, unspecified: Secondary | ICD-10-CM

## 2014-02-01 DIAGNOSIS — M5430 Sciatica, unspecified side: Secondary | ICD-10-CM

## 2014-02-01 DIAGNOSIS — N39498 Other specified urinary incontinence: Secondary | ICD-10-CM

## 2014-02-01 DIAGNOSIS — K222 Esophageal obstruction: Secondary | ICD-10-CM

## 2014-02-01 DIAGNOSIS — R7989 Other specified abnormal findings of blood chemistry: Secondary | ICD-10-CM

## 2014-02-01 DIAGNOSIS — E1121 Type 2 diabetes mellitus with diabetic nephropathy: Secondary | ICD-10-CM

## 2014-02-01 DIAGNOSIS — F329 Major depressive disorder, single episode, unspecified: Secondary | ICD-10-CM

## 2014-02-01 DIAGNOSIS — M5440 Lumbago with sciatica, unspecified side: Secondary | ICD-10-CM

## 2014-02-01 DIAGNOSIS — F418 Other specified anxiety disorders: Secondary | ICD-10-CM

## 2014-02-01 DIAGNOSIS — E049 Nontoxic goiter, unspecified: Secondary | ICD-10-CM

## 2014-02-01 DIAGNOSIS — R946 Abnormal results of thyroid function studies: Secondary | ICD-10-CM

## 2014-02-01 DIAGNOSIS — F419 Anxiety disorder, unspecified: Secondary | ICD-10-CM

## 2014-02-01 DIAGNOSIS — F32A Depression, unspecified: Secondary | ICD-10-CM

## 2014-02-01 DIAGNOSIS — E860 Dehydration: Secondary | ICD-10-CM

## 2014-02-01 DIAGNOSIS — I10 Essential (primary) hypertension: Secondary | ICD-10-CM

## 2014-02-01 DIAGNOSIS — M8580 Other specified disorders of bone density and structure, unspecified site: Secondary | ICD-10-CM

## 2014-02-01 DIAGNOSIS — M4802 Spinal stenosis, cervical region: Secondary | ICD-10-CM

## 2014-02-01 MED ORDER — GABAPENTIN 100 MG PO CAPS: ORAL_CAPSULE | ORAL | Status: DC

## 2014-02-01 NOTE — Patient Instructions (Addendum)
F/u in 3 month, call if you need me before    NEED to drink 64 ounces water every day, and need chem7 and EGFR non fasting  next week Monday, kidney function was not good because you were dehydrated  Sugar has increased, STOP ENSURE, glucerna  Is the supplement for diabetics  Call in if blood sugar remains high  Gabapentin dose is increased to two in the morning, one  At mid day and two at night  Non fasting  Fasting chem 7 an EGFr, hBa1C in 3 month    

## 2014-02-01 NOTE — Progress Notes (Signed)
Subjective:    Patient ID: [name] [name], female    DOB: Sep 17, 1929, 81 y.o.   MRN: [id number]  HPI The PT is here for follow up and re-evaluation of chronic medical conditions, medication management and review of any available recent lab and radiology data.  Preventive health is updated, specifically  Cancer screening and Immunization.   Questions or concerns regarding consultations or procedures which the PT has had in the interim are  addressed. The PT denies any adverse reactions to current medications since the last visit.  C/o increased and uncontrolled pain and numbness in neck and left upper extremity and uncontrolled low back pain with numbness Blood sugar is less well controled than in the past       Review of Systems See HPI Denies recent fever or chills. Denies sinus pressure, nasal congestion, ear pain or sore throat. Denies chest congestion, productive cough or wheezing. Denies chest pains, palpitations and leg swelling Denies abdominal pain, nausea, vomiting,diarrhea or constipation.   Denies dysuria, frequency, hesitancy or incontinence. C/o uncontrolled joint pain, and limitation in mobility.Entire spine, both lower extremities and left upper extremity Denies headaches or  Seizures,has increased  numbness, and tingling in lower and left upper extremities. Denies uncontrolled  depression, anxiety or insomnia. Denies skin break down or rash.        Objective:   Physical Exam BP 124/66 mmHg  Pulse 62  Resp 18  Ht 5\' 2"  (1.575 m)  Wt 168 lb 1.3 oz (76.241 kg)  BMI 30.73 kg/m2  SpO2 93% Patient alert and oriented and in no cardiopulmonary distress.Pt in pain  HEENT: No facial asymmetry, EOMI,   oropharynx pink and moist.  Neck decreased no JVD,thyromegaly,.  Chest: Clear to auscultation bilaterally.  CVS: S1, S2 no murmurs, no S3.Regular rate.  ABD: Soft non tender.   Ext: No edema  MS: decreased ROM spine, shoulders,hips and knees, requires a walker  to assist with ambulation  Skin: Intact, no ulcerations or rash noted.  Psych: Good eye contact, normal affect. Memory intact not anxious or depressed appearing.  CNS: CN 2-12 intact, power,  normal throughout.no focal deficits noted.        Assessment & Plan:  Back pain of lumbar region with sciatica Increased and uncontrolled. Increase gabapentin dose, continue oxycodone as before   Diabetes mellitus with nephropathy Worsened, with deterioration in renal function neds endo to manage her diabetes and she is also referred to nephrology   Essential hypertension Controlled, no change in medication    GERD with stricture reprots improvement in swallowing , is also being followed by ENT for chronic hoarseness from GERD   Urinary incontinence Improved on current med continue same   Abnormal thyroid blood test Repeat TSH, free t3 and free T4, , will refer to Dr Dorris Fetch re abn TSh also as he will now be treating her diabetes   Hyperlipidemia with target LDL less than 100 Elevated TG otherwise controlled, no med change Hyperlipidemia:Low fat diet discussed and encouraged.     Anxiety and depression Controlled, no change in medication    Spinal stenosis in cervical region Worsening neck and upper extremity pain and numbness , established severe disease form prior Ct scan of neck, will need MRI c spine to furhter evaluate , will call to discuss this with pt. She has a lot of issues which need attention so first 2 are her diabetes and renal function   Osteopenia Received first dose of Reclast 11/20/ 2015

## 2014-02-04 ENCOUNTER — Other Ambulatory Visit: Payer: Self-pay

## 2014-02-04 ENCOUNTER — Other Ambulatory Visit: Payer: Self-pay | Admitting: Family Medicine

## 2014-02-04 MED ORDER — OXYCODONE-ACETAMINOPHEN 10-325 MG PO TABS: ORAL_TABLET | ORAL | Status: DC

## 2014-02-09 LAB — COMPLETE METABOLIC PANEL WITH GFR
ALBUMIN: 4.3 g/dL (ref 3.5–5.2)
ALK PHOS: 37 U/L — AB (ref 39–117)
ALT: 14 U/L (ref 0–35)
AST: 21 U/L (ref 0–37)
BUN: 36 mg/dL — AB (ref 6–23)
CALCIUM: 8.7 mg/dL (ref 8.4–10.5)
CHLORIDE: 98 meq/L (ref 96–112)
CO2: 27 meq/L (ref 19–32)
Creat: 1.7 mg/dL — ABNORMAL HIGH (ref 0.50–1.10)
GFR, EST NON AFRICAN AMERICAN: 28 mL/min — AB
GFR, Est African American: 32 mL/min — ABNORMAL LOW
GLUCOSE: 226 mg/dL — AB (ref 70–99)
Potassium: 4.3 mEq/L (ref 3.5–5.3)
Sodium: 139 mEq/L (ref 135–145)
TOTAL PROTEIN: 6.9 g/dL (ref 6.0–8.3)
Total Bilirubin: 0.4 mg/dL (ref 0.2–1.2)

## 2014-02-23 ENCOUNTER — Other Ambulatory Visit: Payer: Self-pay | Admitting: Family Medicine

## 2014-02-23 DIAGNOSIS — N184 Chronic kidney disease, stage 4 (severe): Secondary | ICD-10-CM

## 2014-02-23 DIAGNOSIS — IMO0001 Reserved for inherently not codable concepts without codable children: Secondary | ICD-10-CM

## 2014-02-23 DIAGNOSIS — E1165 Type 2 diabetes mellitus with hyperglycemia: Principal | ICD-10-CM

## 2014-02-23 DIAGNOSIS — Z794 Long term (current) use of insulin: Principal | ICD-10-CM

## 2014-02-23 LAB — COMPLETE METABOLIC PANEL WITH GFR
ALBUMIN: 4.2 g/dL (ref 3.5–5.2)
ALT: 15 U/L (ref 0–35)
AST: 25 U/L (ref 0–37)
Alkaline Phosphatase: 32 U/L — ABNORMAL LOW (ref 39–117)
BUN: 50 mg/dL — ABNORMAL HIGH (ref 6–23)
CO2: 26 mEq/L (ref 19–32)
Calcium: 9.8 mg/dL (ref 8.4–10.5)
Chloride: 96 mEq/L (ref 96–112)
Creat: 1.99 mg/dL — ABNORMAL HIGH (ref 0.50–1.10)
GFR, Est African American: 26 mL/min — ABNORMAL LOW
GFR, Est Non African American: 23 mL/min — ABNORMAL LOW
GLUCOSE: 217 mg/dL — AB (ref 70–99)
POTASSIUM: 4.9 meq/L (ref 3.5–5.3)
Sodium: 136 mEq/L (ref 135–145)
Total Bilirubin: 0.4 mg/dL (ref 0.2–1.2)
Total Protein: 6.8 g/dL (ref 6.0–8.3)

## 2014-02-23 LAB — HEMOGLOBIN A1C
Hgb A1c MFr Bld: 8.8 % — ABNORMAL HIGH (ref <5.7)
MEAN PLASMA GLUCOSE: 206 mg/dL — AB (ref <117)

## 2014-02-25 ENCOUNTER — Ambulatory Visit (INDEPENDENT_AMBULATORY_CARE_PROVIDER_SITE_OTHER): Payer: Commercial Managed Care - HMO | Admitting: Otolaryngology

## 2014-02-25 DIAGNOSIS — R1312 Dysphagia, oropharyngeal phase: Secondary | ICD-10-CM

## 2014-02-27 ENCOUNTER — Telehealth: Payer: Self-pay | Admitting: Family Medicine

## 2014-02-27 DIAGNOSIS — M4802 Spinal stenosis, cervical region: Secondary | ICD-10-CM | POA: Insufficient documentation

## 2014-02-27 NOTE — Assessment & Plan Note (Signed)
Controlled, no change in medication  

## 2014-02-27 NOTE — Telephone Encounter (Signed)
Pls contact pt. Please let her know that I have been reviewing her chart, and I am asking Dr Dorris Fetch to assess her thyroid function as well as her diabetes. She did have a bio[psy of her thyroid in 2015 which showed a goiter, and she does follow with Dr Benjamine Mola for hoarseness from reflux.  BUT her thyroid function screening  test was slightly abnormal last year, needs a rept TSH , free T3 nd 4 , which I just ordered, better to get the lab before she sees Dr Dorris Fetch, if he will fit this into his appt in March when  He is to see her about the diabetes. I just  put in a referral for him to eval her thyroid at that visit also , so I am asking that you /Luann call over to have that dx added to the visit if possible. If he will not eval the thyroid then ( which I think highly unlikely) then do NOT let her go for another lab draw right now, just had one on 02/22/2014.  I am also thinking that because of her increased neck and arm pain she may needs an MRI of her neck , she severe c spine stenosis from CT neck don in 2013. BUT can hold off on this  For now, needs to focus on diabetes and kidney. I will call and speak with her on my return, pls let her know, I am concerned as she has a lot going on, I am  Hopeful  that she will start feeling and doing better soon

## 2014-02-27 NOTE — Assessment & Plan Note (Signed)
Worsening neck and upper extremity pain and numbness , established severe disease form prior Ct scan of neck, will need MRI c spine to furhter evaluate , will call to discuss this with pt. She has a lot of issues which need attention so first 2 are her diabetes and renal function

## 2014-02-27 NOTE — Assessment & Plan Note (Signed)
Worsened, with deterioration in renal function neds endo to manage her diabetes and she is also referred to nephrology

## 2014-02-27 NOTE — Assessment & Plan Note (Addendum)
Received first dose of Reclast 11/20/ 2015

## 2014-02-27 NOTE — Assessment & Plan Note (Signed)
Improved on current med continue same 

## 2014-02-27 NOTE — Assessment & Plan Note (Signed)
Repeat TSH, free t3 and free T4, , will refer to Dr Dorris Fetch re abn TSh also as he will now be treating her diabetes

## 2014-02-27 NOTE — Assessment & Plan Note (Signed)
reprots improvement in swallowing , is also being followed by ENT for chronic hoarseness from GERD

## 2014-02-27 NOTE — Assessment & Plan Note (Signed)
Increased and uncontrolled. Increase gabapentin dose, continue oxycodone as before

## 2014-02-27 NOTE — Assessment & Plan Note (Signed)
Elevated TG otherwise controlled, no med change Hyperlipidemia:Low fat diet discussed and encouraged.

## 2014-03-01 NOTE — Telephone Encounter (Signed)
Patient aware- will mail lab order to her because Luann said she sent the referral and they would call her with another date. Patient aware

## 2014-03-05 LAB — T4, FREE: FREE T4: 1.04 ng/dL (ref 0.80–1.80)

## 2014-03-05 LAB — T3, FREE: T3, Free: 2.1 pg/mL — ABNORMAL LOW (ref 2.3–4.2)

## 2014-03-05 LAB — TSH: TSH: 3.252 u[IU]/mL (ref 0.350–4.500)

## 2014-03-12 ENCOUNTER — Other Ambulatory Visit: Payer: Self-pay

## 2014-03-12 MED ORDER — OXYCODONE-ACETAMINOPHEN 10-325 MG PO TABS: ORAL_TABLET | ORAL | Status: DC

## 2014-03-24 ENCOUNTER — Telehealth: Payer: Self-pay | Admitting: Family Medicine

## 2014-03-24 DIAGNOSIS — M546 Pain in thoracic spine: Secondary | ICD-10-CM

## 2014-03-24 NOTE — Telephone Encounter (Signed)
Sever acute onset of upper back/ posterior chest pain, no aggravation , approx 1 to 2  week ago, rated at an 8 to 10, Worse with change in position   Radiates to right leg, no weakness or numbness, pt aware that she needs an xray of the area, order is at [facility]

## 2014-03-24 NOTE — Telephone Encounter (Signed)
Please advise 

## 2014-03-25 ENCOUNTER — Ambulatory Visit (HOSPITAL_COMMUNITY)
Admission: RE | Admit: 2014-03-25 | Discharge: 2014-03-25 | Disposition: A | Discharge status: Home / Self Care | Payer: Commercial Managed Care - HMO | Source: Ambulatory Visit | Attending: Family Medicine | Admitting: Family Medicine

## 2014-03-25 ENCOUNTER — Other Ambulatory Visit: Payer: Self-pay | Admitting: Family Medicine

## 2014-03-25 DIAGNOSIS — M546 Pain in thoracic spine: Secondary | ICD-10-CM

## 2014-03-29 ENCOUNTER — Telehealth: Payer: Self-pay | Admitting: Family Medicine

## 2014-03-29 ENCOUNTER — Other Ambulatory Visit: Payer: Self-pay | Admitting: Family Medicine

## 2014-03-29 DIAGNOSIS — M546 Pain in thoracic spine: Secondary | ICD-10-CM

## 2014-03-29 DIAGNOSIS — M47814 Spondylosis without myelopathy or radiculopathy, thoracic region: Secondary | ICD-10-CM

## 2014-03-29 DIAGNOSIS — R29898 Other symptoms and signs involving the musculoskeletal system: Secondary | ICD-10-CM

## 2014-03-30 NOTE — Telephone Encounter (Signed)
noted 

## 2014-03-31 ENCOUNTER — Other Ambulatory Visit (INDEPENDENT_AMBULATORY_CARE_PROVIDER_SITE_OTHER): Payer: Self-pay | Admitting: Otolaryngology

## 2014-03-31 ENCOUNTER — Other Ambulatory Visit: Payer: Self-pay | Admitting: Family Medicine

## 2014-03-31 DIAGNOSIS — E041 Nontoxic single thyroid nodule: Secondary | ICD-10-CM

## 2014-04-01 ENCOUNTER — Telehealth: Payer: Self-pay | Admitting: *Deleted

## 2014-04-01 NOTE — Telephone Encounter (Signed)
I asked if there was anything I could help her with and she preferred to speak with you about some things she was concerned about before her MRI. I told her that I would give you the message

## 2014-04-01 NOTE — Telephone Encounter (Signed)
Let her know I will be call tomorrow pls

## 2014-04-01 NOTE — Telephone Encounter (Signed)
Pt called requesting to speak with Dr. Moshe Cipro I made pt aware that Dr. Moshe Cipro is seeing pt's and I can send a message, pt requested for Dr. Moshe Cipro to call her back. Please advise

## 2014-04-04 ENCOUNTER — Telehealth: Payer: Self-pay | Admitting: Family Medicine

## 2014-04-04 NOTE — Telephone Encounter (Signed)
I returned the pt's call today , she states she had an acute episode of straining at stool, which caused severe numbness in both upper and lower extremities, which has since lessened , s no more concern Would prefer epidural to thoracic s[pine rather than neurosurg consult , has had success in c spine and lumbar spine in the past

## 2014-04-05 ENCOUNTER — Ambulatory Visit (HOSPITAL_COMMUNITY)
Admission: RE | Admit: 2014-04-05 | Discharge: 2014-04-05 | Disposition: A | Discharge status: Home / Self Care | Payer: Commercial Managed Care - HMO | Source: Ambulatory Visit | Attending: Family Medicine | Admitting: Family Medicine

## 2014-04-05 ENCOUNTER — Other Ambulatory Visit: Payer: Self-pay | Admitting: Family Medicine

## 2014-04-05 DIAGNOSIS — M47814 Spondylosis without myelopathy or radiculopathy, thoracic region: Secondary | ICD-10-CM

## 2014-04-05 DIAGNOSIS — M47896 Other spondylosis, lumbar region: Secondary | ICD-10-CM | POA: Diagnosis not present

## 2014-04-05 DIAGNOSIS — R296 Repeated falls: Secondary | ICD-10-CM | POA: Insufficient documentation

## 2014-04-05 DIAGNOSIS — M546 Pain in thoracic spine: Secondary | ICD-10-CM | POA: Diagnosis present

## 2014-04-05 DIAGNOSIS — M47894 Other spondylosis, thoracic region: Secondary | ICD-10-CM | POA: Insufficient documentation

## 2014-04-05 DIAGNOSIS — M545 Low back pain: Secondary | ICD-10-CM | POA: Insufficient documentation

## 2014-04-05 DIAGNOSIS — R29898 Other symptoms and signs involving the musculoskeletal system: Secondary | ICD-10-CM

## 2014-04-06 ENCOUNTER — Other Ambulatory Visit (HOSPITAL_COMMUNITY): Payer: Commercial Managed Care - HMO

## 2014-04-06 ENCOUNTER — Other Ambulatory Visit: Payer: Self-pay | Admitting: Family Medicine

## 2014-04-06 ENCOUNTER — Ambulatory Visit (HOSPITAL_COMMUNITY)
Admission: RE | Admit: 2014-04-06 | Discharge: 2014-04-06 | Disposition: A | Discharge status: Home / Self Care | Payer: Commercial Managed Care - HMO | Source: Ambulatory Visit | Attending: Otolaryngology | Admitting: Otolaryngology

## 2014-04-06 DIAGNOSIS — E041 Nontoxic single thyroid nodule: Secondary | ICD-10-CM | POA: Diagnosis present

## 2014-04-06 DIAGNOSIS — M546 Pain in thoracic spine: Secondary | ICD-10-CM

## 2014-04-09 ENCOUNTER — Other Ambulatory Visit: Payer: Self-pay

## 2014-04-09 MED ORDER — OXYCODONE-ACETAMINOPHEN 10-325 MG PO TABS: ORAL_TABLET | ORAL | Status: DC

## 2014-04-13 ENCOUNTER — Telehealth: Payer: Self-pay | Admitting: *Deleted

## 2014-04-13 ENCOUNTER — Other Ambulatory Visit: Payer: Self-pay | Admitting: Family Medicine

## 2014-04-13 MED ORDER — BENZONATATE 100 MG PO CAPS: 100.0000 mg | ORAL_CAPSULE | Freq: Two times a day (BID) | ORAL | Status: DC | PRN

## 2014-04-13 MED ORDER — AZITHROMYCIN 250 MG PO TABS: ORAL_TABLET | ORAL | Status: DC

## 2014-04-13 NOTE — Addendum Note (Signed)
Addended by: [name] B on: 04/13/2014 11:50 AM   Modules accepted: Orders

## 2014-04-13 NOTE — Telephone Encounter (Signed)
Pt called stating she has got the approval for her procedure in [place], pt states she is having sinus attacks and she needs something called in for it before she schedules her procedure. Please advise [phone]

## 2014-04-13 NOTE — Telephone Encounter (Signed)
Pls send z pack # 1 and tessalon perles 100 mg twice daily as needed #20 and let her knwoif no better, needs to schedule appt to be seen Just have her spk with Luann re appt for epidural pls

## 2014-04-13 NOTE — Telephone Encounter (Signed)
Patient aware.  She will schedule epidural on her own after she completes abt

## 2014-04-13 NOTE — Telephone Encounter (Signed)
Spoke with patient and states that she does not want to schedule epidural until her symptoms resolve.  Is having sinus drainage and cough with green sputum.  No noted fever or chills.  Feels fatigued.  Please advise.

## 2014-04-19 ENCOUNTER — Encounter: Payer: Self-pay | Admitting: Gastroenterology

## 2014-04-19 ENCOUNTER — Ambulatory Visit (INDEPENDENT_AMBULATORY_CARE_PROVIDER_SITE_OTHER): Payer: Commercial Managed Care - HMO | Admitting: Gastroenterology

## 2014-04-19 VITALS — BP 141/67 | HR 66 | Temp 97.8°F | Ht 62.0 in | Wt 166.6 lb

## 2014-04-19 DIAGNOSIS — R1310 Dysphagia, unspecified: Secondary | ICD-10-CM | POA: Diagnosis not present

## 2014-04-19 NOTE — Progress Notes (Signed)
Referring Provider: Fayrene Helper, MD Primary Care Physician:  Tula Nakayama, MD Primary GI: Dr. Gala Romney    Chief Complaint  Patient presents with  . Follow-up    HPI:   [name] [name] is a 81 y.o. female presenting today with a history of dysphagia and early satiety. EGD recently completed (Sept 2015) with Schatzki's ring s/p dilation. Last seen Jan 2016, here for weight check. With historical weight loss, CT abd/pelvis completed and unremarkable. She has gained a few pounds since Jan 2016.   Notes back pain, radiates down leg. Also notes pain radiating from back to her arm/hands. Epidural injection scheduled for next week. One day had episode of dysphagia with a baked potato but no choking. Took a drink of water with relief. Rare.   Had a bout of constipation because of changing laxatives. Now back to the agent OTC she likes. No rectal bleeding. Good appetite.   Past Medical History  Diagnosis Date  . Atrial flutter febuary 2008       atrial flutter... DC Cardioversion...successful... holding sinus as of january 2011 coumadin therapy...discontinued...severe hematoma secondary to fall  . Hypertension   . Hyperlipidemia   . Hypercholesterolemia   . Mitral regurgitation 2010    mild ...echo...2010  . Obesity   . Sleep apnea   . Fatigue   . Depression with anxiety   . Visual loss     unspecified  . Basal cell carcinoma   . Hematochezia   . Hematoma     ...buttocks secondary to fall november 2009 with coumadin  coumadin stopped  . Diabetes mellitus     type 2  . Back pain     with radiculopathy  . Arthritis   . Osteoarthritis   . Acute cystitis   . GERD (gastroesophageal reflux disease)   . Diverticulosis of colon   . Constipation   . IBS (irritable bowel syndrome)   . Abdominal bloating   . Nausea   . Erosive esophagitis   . Aortic valve sclerosis     Ef 60 %...echo...july 2010   . Chronic granulomatous disease     right chest ... ct chest... novmber 2009   . Normal nuclear stress test     september ,2007...no ischemia  . Anticoagulant long-term use     Coumadin  stopped...severe hematoma  from fall  . Edema     Painful, April, 2012  . Ejection fraction     EF 60-65%, echo, April 13, 2010, normal RV function, mild mitral regurgitation  . Carotid artery disease     Doppler, [facility], February 21, 2012, less than 50% bilateral carotid stenoses    Past Surgical History  Procedure Laterality Date  . Bilateral bunion removel  [id number]  . Replacement total knee bilateral      2000,2001  . Tonsillectomy  1957  . Appendectomy  1948  . Dilation and curettage of uterus  1952 and 2001  . Abdominal hysterectomy  2001  . Cataract extraction  2001    right   . Other surgical history    . Cataract extraction      left 2005  . Basal cell carcinoma excision  09/2009    right forarm and back of neck and left side   . Colonoscopy   07/17/2006    Dr. Rourk:anal canal hemorrhoids, diminutive rectal polyp status post cold biopsy.  Otherwise, normal rectum. Left sided and  transverse diverticula.  Diminutive polyp in the ascending colon, cold bx.  hyperplastic polyps.   . Esophagogastroduodenoscopy   07/17/2006    Dr. Rourk:single 2 cm distal esophageal erosion consistent with erosive reflux esophagitis, otherwise normal  . Esophagogastroduodenoscopy N/A 09/10/2013    Dr. Rourk:Schatzki's ring - status post Venia Minks dilation. Mild erosive reflux esophagitis. Hiatal hernia with fosamax capsule lodged as above  . Savory dilation N/A 09/10/2013    Procedure: SAVORY DILATION;  Surgeon: Daneil Dolin, MD;  Location: [facility];  Service: Endoscopy;  Laterality: N/A;  Venia Minks dilation N/A 09/10/2013    Procedure: Venia Minks DILATION;  Surgeon: Daneil Dolin, MD;  Location: [facility];  Service: Endoscopy;  Laterality: N/A;    Current Outpatient Prescriptions  Medication Sig Dispense Refill  . ACCU-CHEK AVIVA PLUS test strip USE ONE STRIP THREE  TIMES DAILY 100 each 1  . aspirin 81 MG tablet Take 81 mg by mouth daily.      . benazepril-hydrochlorthiazide (LOTENSIN HCT) 20-12.5 MG per tablet Take 2 tablets by mouth daily. 180 tablet 1  . benzonatate (TESSALON PERLES) 100 MG capsule Take 1 capsule (100 mg total) by mouth 2 (two) times daily as needed for cough. 20 capsule 0  . citalopram (CELEXA) 40 MG tablet TAKE 1 TABLET BY MOUTH EVERY DAY 30 tablet 5  . CRESTOR 40 MG tablet TAKE 1 TABLET BY MOUTH EVERY DAILY 30 tablet 2  . diltiazem (DILT-XR) 240 MG 24 hr capsule TAKE 1 CAPSULE BY MOUTH DAILY. 90 capsule 1  . esomeprazole (NEXIUM) 40 MG capsule Take 40 mg by mouth daily at 12 noon.    . fenofibrate (TRICOR) 145 MG tablet Take 1 tablet (145 mg total) by mouth at bedtime. 90 tablet 1  . fesoterodine (TOVIAZ) 8 MG TB24 tablet Take 1 tablet (8 mg total) by mouth daily. 30 tablet 5  . fish oil-omega-3 fatty acids 1000 MG capsule Take 1 g by mouth daily. 2 caps bid     . fluticasone (FLONASE) 50 MCG/ACT nasal spray Place 2 sprays into both nostrils daily.    . furosemide (LASIX) 20 MG tablet Take 20 mg by mouth.    . furosemide (LASIX) 40 MG tablet TAKE 1 TABLET BY MOUTH EVERY DAY AS DIRECTED 30 tablet 11  . gabapentin (NEURONTIN) 100 MG capsule Two capsules three times daily 180 capsule 5  . glipiZIDE (GLIPIZIDE XL) 10 MG 24 hr tablet Take 1 tablet (10 mg total) by mouth daily. 90 tablet 1  . Insulin Glargine (LANTUS) 100 UNIT/ML Solostar Pen Inject 15 Units into the skin daily.     Marland Kitchen L-Methylfolate-Algae-B12-B6 (METANX) 3-90.314-2-35 MG CAPS TAKE 1 CAPSULE BY MOUTH EVERY DAY 30 capsule 0  . nystatin (MYCOSTATIN/NYSTOP) 100000 UNIT/GM POWD Apply to affected area(s) twice daily for 1 week then as needed 60 g 2  . oxyCODONE-acetaminophen (PERCOCET) 10-325 MG per tablet One tablet four times daily for severe uncontrolled pain 120 tablet 0  . pantoprazole (PROTONIX) 40 MG tablet Take 1 tablet (40 mg total) by mouth daily. 30 tablet 3  .  ranitidine (ZANTAC) 150 MG capsule Take 150 mg by mouth every evening.    Marland Kitchen azithromycin (ZITHROMAX) 250 MG tablet Take 2 tablets on Day 1 followed by 1 tablet daily for Days 2-5 (Patient not taking: Reported on 04/19/2014) 6 tablet 0   No current facility-administered medications for this visit.    Allergies as of 04/19/2014 - Review Complete 04/19/2014  Allergen Reaction Noted  . Lisinopril Nausea And Vomiting   . Losartan  04/08/2013  .  Reclast [zoledronic acid] Other (See Comments) 02/23/2014  . Alprazolam Other (See Comments)   . Sertraline hcl Diarrhea 05/24/2008  . Sulfonamide derivatives Rash     Family History  Problem Relation Age of Onset  . Dementia Mother   . Heart disease Father   . Diabetes    . Arthritis    . Colon cancer Neg Hx     History   Social History  . Marital Status: Married    Spouse Name: N/A  . Number of Children: N/A  . Years of Education: 13   Occupational History  . retired    Social History Main Topics  . Smoking status: Never Smoker   . Smokeless tobacco: Never Used  . Alcohol Use: No  . Drug Use: No  . Sexual Activity: Not on file   Other Topics Concern  . None   Social History Narrative    Review of Systems: As mentioned in HPI  Physical Exam: BP 141/67 mmHg  Pulse 66  Temp(Src) 97.8 F (36.6 C) (Oral)  Ht 5\' 2"  (1.575 m)  Wt 166 lb 9.6 oz (75.569 kg)  BMI 30.46 kg/m2 General:   Alert and oriented. No distress noted. Pleasant and cooperative.  Head:  Normocephalic and atraumatic. Eyes:  Conjuctiva clear without scleral icterus. Mouth:  Oral mucosa pink and moist. Good dentition. No lesions. Abdomen:  +BS, soft, non-tender and non-distended.  Round. Sitting up in chair, limited exam. Umbilical hernia. No rebound or guarding. No HSM or masses noted. Msk:  Symmetrical without gross deformities. Normal posture. Extremities:  Without edema.brace to right leg.  Neurologic:  Alert and  oriented x4;  grossly normal  neurologically. Psych:  Alert and cooperative. Normal mood and affect.

## 2014-04-19 NOTE — Progress Notes (Signed)
cc'ed to pcp °

## 2014-04-19 NOTE — Assessment & Plan Note (Signed)
81 year old female with history of Schatzki's ring s/p dilation Sept 2015, doing quite well at this time. Weight loss concerns resolved, as she is back to her baseline. From a GI standpoint, doing well. Rare occasions of solid food dysphagia noted; likely underlying motility disorder. She would like to hold off on BPE at this point. Discussed pursuing this if recurrent dysphagia. Otherwise, return in 6 months.

## 2014-04-19 NOTE — Patient Instructions (Signed)
Continue to chew well, take small bites, and sit upright while eating. If you have any further issues, please call me. We would then do a swallow study.   Otherwise, we will see you back in 6 months. Have a wonderful summer!

## 2014-04-22 ENCOUNTER — Other Ambulatory Visit: Payer: Self-pay | Admitting: Gastroenterology

## 2014-04-27 ENCOUNTER — Ambulatory Visit
Admission: RE | Admit: 2014-04-27 | Discharge: 2014-04-27 | Disposition: A | Discharge status: Home / Self Care | Payer: Commercial Managed Care - HMO | Source: Ambulatory Visit | Attending: Family Medicine | Admitting: Family Medicine

## 2014-04-27 VITALS — BP 94/41 | HR 52

## 2014-04-27 DIAGNOSIS — M546 Pain in thoracic spine: Secondary | ICD-10-CM

## 2014-04-27 DIAGNOSIS — M5440 Lumbago with sciatica, unspecified side: Secondary | ICD-10-CM

## 2014-04-27 MED ORDER — METHYLPREDNISOLONE ACETATE 40 MG/ML INJ SUSP (RADIOLOG
120.0000 mg | Freq: Once | INTRAMUSCULAR | Status: AC
  Administered 2014-04-27: 120 mg via EPIDURAL

## 2014-04-27 MED ORDER — IOHEXOL 180 MG/ML  SOLN
1.0000 mL | Freq: Once | INTRAMUSCULAR | Status: AC | PRN
  Administered 2014-04-27: 1 mL via EPIDURAL

## 2014-04-27 NOTE — Discharge Instructions (Signed)

## 2014-05-04 ENCOUNTER — Encounter: Payer: Self-pay | Admitting: Family Medicine

## 2014-05-04 ENCOUNTER — Ambulatory Visit (INDEPENDENT_AMBULATORY_CARE_PROVIDER_SITE_OTHER): Payer: Commercial Managed Care - HMO | Admitting: Family Medicine

## 2014-05-04 VITALS — BP 120/62 | HR 68 | Resp 18 | Ht 62.0 in | Wt 163.0 lb

## 2014-05-04 DIAGNOSIS — F418 Other specified anxiety disorders: Secondary | ICD-10-CM

## 2014-05-04 DIAGNOSIS — E1121 Type 2 diabetes mellitus with diabetic nephropathy: Secondary | ICD-10-CM | POA: Diagnosis not present

## 2014-05-04 DIAGNOSIS — F419 Anxiety disorder, unspecified: Secondary | ICD-10-CM

## 2014-05-04 DIAGNOSIS — Z23 Encounter for immunization: Secondary | ICD-10-CM

## 2014-05-04 DIAGNOSIS — F32A Depression, unspecified: Secondary | ICD-10-CM

## 2014-05-04 DIAGNOSIS — I4892 Unspecified atrial flutter: Secondary | ICD-10-CM | POA: Diagnosis not present

## 2014-05-04 DIAGNOSIS — N183 Chronic kidney disease, stage 3 unspecified: Secondary | ICD-10-CM

## 2014-05-04 DIAGNOSIS — F329 Major depressive disorder, single episode, unspecified: Secondary | ICD-10-CM

## 2014-05-04 DIAGNOSIS — I10 Essential (primary) hypertension: Secondary | ICD-10-CM | POA: Diagnosis not present

## 2014-05-04 DIAGNOSIS — M545 Low back pain: Secondary | ICD-10-CM

## 2014-05-04 DIAGNOSIS — K2100 Gastro-esophageal reflux disease with esophagitis, without bleeding: Secondary | ICD-10-CM

## 2014-05-04 DIAGNOSIS — E785 Hyperlipidemia, unspecified: Secondary | ICD-10-CM

## 2014-05-04 DIAGNOSIS — M5440 Lumbago with sciatica, unspecified side: Secondary | ICD-10-CM

## 2014-05-04 DIAGNOSIS — M5430 Sciatica, unspecified side: Secondary | ICD-10-CM

## 2014-05-04 DIAGNOSIS — E663 Overweight: Secondary | ICD-10-CM

## 2014-05-04 DIAGNOSIS — K5730 Diverticulosis of large intestine without perforation or abscess without bleeding: Secondary | ICD-10-CM

## 2014-05-04 NOTE — Patient Instructions (Addendum)
Annual wellness in 4 month, call if you need me before  Glad back pain is improved  Please keep appt with nephrology  Increase levimir to 20 units  since fasting sugars have increased following recent epidural injection, ranging from 150 to 260  When they go back down return to 15 units you are currently on.  Call with questions   Plan to get HBA1C for Dr Dorris Fetch in July as he had requested for his visit, not before.  Prevnar today Thanks for choosing [facility], we consider it a privelige to serve you.

## 2014-05-05 LAB — MICROALBUMIN / CREATININE URINE RATIO
CREATININE, URINE: 29.1 mg/dL
MICROALB UR: 0.2 mg/dL (ref <2.0)
Microalb Creat Ratio: 6.9 mg/g (ref 0.0–30.0)

## 2014-05-14 ENCOUNTER — Other Ambulatory Visit: Payer: Self-pay

## 2014-05-14 MED ORDER — OXYCODONE-ACETAMINOPHEN 10-325 MG PO TABS: ORAL_TABLET | ORAL | Status: DC

## 2014-05-18 ENCOUNTER — Other Ambulatory Visit: Payer: Self-pay

## 2014-05-18 ENCOUNTER — Other Ambulatory Visit: Payer: Self-pay | Admitting: Family Medicine

## 2014-05-18 MED ORDER — METANX 3-90.314-2-35 MG PO CAPS: 1.0000 | ORAL_CAPSULE | Freq: Every day | ORAL | Status: DC

## 2014-05-20 ENCOUNTER — Ambulatory Visit (INDEPENDENT_AMBULATORY_CARE_PROVIDER_SITE_OTHER): Payer: Commercial Managed Care - HMO | Admitting: Otolaryngology

## 2014-05-20 DIAGNOSIS — K219 Gastro-esophageal reflux disease without esophagitis: Secondary | ICD-10-CM | POA: Diagnosis not present

## 2014-05-20 DIAGNOSIS — R1312 Dysphagia, oropharyngeal phase: Secondary | ICD-10-CM | POA: Diagnosis not present

## 2014-06-01 ENCOUNTER — Other Ambulatory Visit: Payer: Self-pay | Admitting: Family Medicine

## 2014-06-03 ENCOUNTER — Telehealth: Payer: Self-pay

## 2014-06-03 DIAGNOSIS — R058 Other specified cough: Secondary | ICD-10-CM

## 2014-06-03 DIAGNOSIS — R05 Cough: Secondary | ICD-10-CM

## 2014-06-03 MED ORDER — PROMETHAZINE-DM 6.25-15 MG/5ML PO SYRP: ORAL_SOLUTION | ORAL | Status: DC

## 2014-06-03 MED ORDER — PREDNISONE 5 MG PO TABS: 5.0000 mg | ORAL_TABLET | Freq: Two times a day (BID) | ORAL | Status: AC

## 2014-06-03 NOTE — Addendum Note (Signed)
Addended by: Fayrene Helper on: 06/03/2014 04:33 PM   Modules accepted: Orders

## 2014-06-03 NOTE — Addendum Note (Signed)
Addended by: [name] B on: 06/03/2014 05:07 PM   Modules accepted: Orders

## 2014-06-03 NOTE — Telephone Encounter (Signed)
Spoke with patient and she will not start Etodolac.  She is complaining of a productive cough and states that she was put on an ABT at last visit which helped but symptoms have now returned.  Is asking for a refill on abt or something different than tessalon Perles in which she says burn her throat.  Please advise

## 2014-06-03 NOTE — Telephone Encounter (Signed)
Patient aware and states that she thinks that there may be some infection.  Plans to have husband collect specimen cup for sputum culture.  Aware of meds and orders placed for culture.

## 2014-06-03 NOTE — Telephone Encounter (Signed)
I will send limited amt of phenergan dm for bedtime use only , pls warn her of increased fall risk as has sedative s/e  Also recommend sputum c/s if there is concern of infection, green. Chills fever, fatigue. Will also send 5 day coyurse of prednisone as likely has an allergic cough (all meds entered)

## 2014-06-03 NOTE — Telephone Encounter (Signed)
With her kidney function as is , not recommended generally, if she has not taken any yet, 6 days after the fall, DO NOT START

## 2014-06-18 ENCOUNTER — Other Ambulatory Visit: Payer: Self-pay

## 2014-06-18 MED ORDER — OXYCODONE-ACETAMINOPHEN 10-325 MG PO TABS: ORAL_TABLET | ORAL | Status: DC

## 2014-07-04 DIAGNOSIS — Z23 Encounter for immunization: Secondary | ICD-10-CM | POA: Insufficient documentation

## 2014-07-04 NOTE — Assessment & Plan Note (Addendum)
Improved./ Patient re-educated about  the importance of commitment to a  minimum of 150 minutes of exercise per week.  The importance of healthy food choices with portion control discussed. Encouraged to start a food diary, count calories and to consider  joining a support group. Sample diet sheets offered. Goals set by the patient for the next several months.   Weight /BMI 05/04/2014 04/19/2014 02/01/2014  WEIGHT 163 lb 166 lb 9.6 oz 168 lb 1.3 oz  HEIGHT 5\' 2"  5\' 2"  5\' 2"   BMI 29.81 kg/m2 30.46 kg/m2 30.73 kg/m2    Current exercise per week 2minutes.

## 2014-07-04 NOTE — Assessment & Plan Note (Signed)
Elevated TG otherwise controlled Hyperlipidemia:Low fat diet discussed and encouraged.   Lipid Panel  Lab Results  Component Value Date   CHOL 168 01/29/2014   HDL 71 01/29/2014   LDLCALC 61 01/29/2014   TRIG 182* 01/29/2014   CHOLHDL 2.4 01/29/2014

## 2014-07-04 NOTE — Assessment & Plan Note (Signed)
Followed by endocrine and improved, however, with recent  Epidural, blood sugars have increased and pt advised to temporarily increase her levemir and keep f/u with endo as planned [name] [name] is reminded of the importance of commitment to daily physical activity for 30 minutes or more, as able and the need to limit carbohydrate intake to 30 to 60 grams per meal to help with blood sugar control.   The need to take medication as prescribed, test blood sugar as directed, and to call between visits if there is a concern that blood sugar is uncontrolled is also discussed.   [name] [name] is reminded of the importance of daily foot exam, annual eye examination, and good blood sugar, blood pressure and cholesterol control.  Diabetic Labs Latest Ref Rng 05/04/2014 02/22/2014 02/08/2014 01/29/2014 12/15/2013  HbA1c <5.7 % - 8.8(H) - 8.5(H) -  Microalbumin <2.0 mg/dL 0.2 - - - -  Micro/Creat Ratio 0.0 - 30.0 mg/g 6.9 - - - -  Chol 0 - 200 mg/dL - - - 168 -  HDL >39 mg/dL - - - 71 -  Calc LDL 0 - 99 mg/dL - - - 61 -  Triglycerides <150 mg/dL - - - 182(H) -  Creatinine 0.50 - 1.10 mg/dL - 1.99(H) 1.70(H) 1.95(H) 1.65(H)   BP/Weight 05/04/2014 04/27/2014 04/19/2014 02/01/2014 01/18/2014 12/15/2013 28/36/6294  Systolic BP 765 94 465 035 465 681 275  Diastolic BP 62 41 67 66 78 64 68  Wt. (Lbs) 163 - 166.6 168.08 164.6 149.4 167  BMI 29.81 - 30.46 30.73 30.1 27.32 30.54   Foot/eye exam completion dates Latest Ref Rng 05/04/2014 10/15/2013  Eye Exam No Retinopathy - No Retinopathy  Foot exam Order - - -  Foot Form Completion - Done -

## 2014-07-04 NOTE — Assessment & Plan Note (Signed)
Rate controlled and denies palpitations

## 2014-07-04 NOTE — Assessment & Plan Note (Signed)
Controlled, no change in medication  

## 2014-07-04 NOTE — Assessment & Plan Note (Signed)
Denies any recent rectal bleeding or lower abdominal pain since last visit, stable

## 2014-07-04 NOTE — Assessment & Plan Note (Signed)
After obtaining informed consent, the vaccine is  administered by LPN.  

## 2014-07-04 NOTE — Assessment & Plan Note (Signed)
Improved following recent epidural, chronic pain management as before

## 2014-07-04 NOTE — Assessment & Plan Note (Signed)
Controlled, no change in medication DASH diet and commitment to daily physical activity for a minimum of 30 minutes discussed and encouraged, as a part of hypertension management. The importance of attaining a healthy weight is also discussed.  BP/Weight 05/04/2014 04/27/2014 04/19/2014 02/01/2014 01/18/2014 12/15/2013 16/10/9602  Systolic BP 540 94 981 191 478 295 621  Diastolic BP 62 41 67 66 78 64 68  Wt. (Lbs) 163 - 166.6 168.08 164.6 149.4 167  BMI 29.81 - 30.46 30.73 30.1 27.32 30.54

## 2014-07-04 NOTE — Progress Notes (Signed)
Subjective:    Patient ID: [name] [name], female    DOB: [birth year], 81 y.o.   MRN: [id number]  HPI The PT is here for follow up and re-evaluation of chronic medical conditions, medication management and review of any available recent lab and radiology data.  Preventive health is updated, specifically  Cancer screening and Immunization.   Reports marked improvement in back pain following epidural, notes however that blood sugars have risen and she has been adjusting her insulin to accommodate this. Denies hypoglycemia. The PT denies any adverse reactions to current medications since the last visit.       Review of Systems See HPI Denies recent fever or chills. Denies sinus pressure, nasal congestion, ear pain or sore throat. Denies chest congestion, productive cough or wheezing. Denies chest pains, palpitations and leg swelling Denies abdominal pain, nausea, vomiting,diarrhea or constipation.   Denies dysuria, frequency, hesitancy , has incontinence mainly due to mobility issues Chronic joint pain, swelling and limitation in mobility.No falls since last visit Denies headaches, seizures, numbness, or tingling. Denies uncontrolled depression, anxiety or insomnia. Denies skin break down or rash.         Objective:   Physical Exam BP 120/62 mmHg  Pulse 68  Resp 18  Ht 5\' 2"  (1.575 m)  Wt 163 lb (73.936 kg)  BMI 29.81 kg/m2  SpO2 93% Patient alert and oriented and in no cardiopulmonary distress.  HEENT: No facial asymmetry, EOMI,   oropharynx pink and moist.  Neck decreased ROM no JVD, no mass.  Chest: Clear to auscultation bilaterally.  CVS: S1, S2 no murmurs, no S3.Regular rate.  ABD: Soft non tender.   Ext: No edema  MS: Decreased ROM spine hips, shoulders and knees, relies on assistive device for safe mobility  Skin: Intact, no ulcerations or rash noted.  Psych: Good eye contact, normal affect. Memory intact not anxious or depressed appearing.  CNS: CN 2-12  intact, power,  normal throughout.no focal deficits noted.        Assessment & Plan:  Essential hypertension Controlled, no change in medication DASH diet and commitment to daily physical activity for a minimum of 30 minutes discussed and encouraged, as a part of hypertension management. The importance of attaining a healthy weight is also discussed.  BP/Weight 05/04/2014 04/27/2014 04/19/2014 02/01/2014 01/18/2014 12/15/2013 23/53/6144  Systolic BP 315 94 400 867 619 509 326  Diastolic BP 62 41 67 66 78 64 68  Wt. (Lbs) 163 - 166.6 168.08 164.6 149.4 167  BMI 29.81 - 30.46 30.73 30.1 27.32 30.54        Atrial flutter Rate controlled and denies palpitations  GERD Controlled, no change in medication   Diverticulosis of large intestine Denies any recent rectal bleeding or lower abdominal pain since last visit, stable  Diabetes mellitus with nephropathy Followed by endocrine and improved, however, with recent  Epidural, blood sugars have increased and pt advised to temporarily increase her levemir and keep f/u with endo as planned [name] [name] is reminded of the importance of commitment to daily physical activity for 30 minutes or more, as able and the need to limit carbohydrate intake to 30 to 60 grams per meal to help with blood sugar control.   The need to take medication as prescribed, test blood sugar as directed, and to call between visits if there is a concern that blood sugar is uncontrolled is also discussed.   [name] [name] is reminded of the importance of daily foot exam, annual eye examination,  and good blood sugar, blood pressure and cholesterol control.  Diabetic Labs Latest Ref Rng 05/04/2014 02/22/2014 02/08/2014 01/29/2014 12/15/2013  HbA1c <5.7 % - 8.8(H) - 8.5(H) -  Microalbumin <2.0 mg/dL 0.2 - - - -  Micro/Creat Ratio 0.0 - 30.0 mg/g 6.9 - - - -  Chol 0 - 200 mg/dL - - - 168 -  HDL >39 mg/dL - - - 71 -  Calc LDL 0 - 99 mg/dL - - - 61 -  Triglycerides <150 mg/dL - - -  182(H) -  Creatinine 0.50 - 1.10 mg/dL - 1.99(H) 1.70(H) 1.95(H) 1.65(H)   BP/Weight 05/04/2014 04/27/2014 04/19/2014 02/01/2014 01/18/2014 12/15/2013 53/97/6734  Systolic BP 193 94 790 240 973 532 992  Diastolic BP 62 41 67 66 78 64 68  Wt. (Lbs) 163 - 166.6 168.08 164.6 149.4 167  BMI 29.81 - 30.46 30.73 30.1 27.32 30.54   Foot/eye exam completion dates Latest Ref Rng 05/04/2014 10/15/2013  Eye Exam No Retinopathy - No Retinopathy  Foot exam Order - - -  Foot Form Completion - Done -         CKD (chronic kidney disease) stage 3, GFR 30-59 ml/[name] Has fo;llow up with nephrology which she needs to keep, understands t avoid nephrotoxic drugs  Anxiety and depression Controlled, no change in medication   Back pain of lumbar region with sciatica Improved following recent epidural, chronic pain management as before  Hyperlipidemia with target LDL less than 100 Elevated TG otherwise controlled Hyperlipidemia:Low fat diet discussed and encouraged.   Lipid Panel  Lab Results  Component Value Date   CHOL 168 01/29/2014   HDL 71 01/29/2014   LDLCALC 61 01/29/2014   TRIG 182* 01/29/2014   CHOLHDL 2.4 01/29/2014        Overweight (BMI 25.0-29.9) Improved./ Patient re-educated about  the importance of commitment to a  minimum of 150 minutes of exercise per week.  The importance of healthy food choices with portion control discussed. Encouraged to start a food diary, count calories and to consider  joining a support group. Sample diet sheets offered. Goals set by the patient for the next several months.   Weight /BMI 05/04/2014 04/19/2014 02/01/2014  WEIGHT 163 lb 166 lb 9.6 oz 168 lb 1.3 oz  HEIGHT 5\' 2"  5\' 2"  5\' 2"   BMI 29.81 kg/m2 30.46 kg/m2 30.73 kg/m2    Current exercise per week 73minutes.   Need for vaccination with 13-polyvalent pneumococcal conjugate vaccine After obtaining informed consent, the vaccine is  administered by LPN.

## 2014-07-04 NOTE — Assessment & Plan Note (Signed)
Has fo;llow up with nephrology which she needs to keep, understands t avoid nephrotoxic drugs

## 2014-07-06 ENCOUNTER — Other Ambulatory Visit: Payer: Self-pay | Admitting: Family Medicine

## 2014-07-07 ENCOUNTER — Telehealth: Payer: Self-pay | Admitting: Family Medicine

## 2014-07-07 NOTE — Telephone Encounter (Signed)
Patient will come in tomorrow to be seen

## 2014-07-08 ENCOUNTER — Encounter: Payer: Self-pay | Admitting: Family Medicine

## 2014-07-08 ENCOUNTER — Ambulatory Visit (INDEPENDENT_AMBULATORY_CARE_PROVIDER_SITE_OTHER): Payer: Medicare HMO | Admitting: Family Medicine

## 2014-07-08 VITALS — BP 180/86 | HR 93 | Temp 99.7°F | Resp 18 | Wt 170.1 lb

## 2014-07-08 DIAGNOSIS — L03119 Cellulitis of unspecified part of limb: Secondary | ICD-10-CM | POA: Diagnosis not present

## 2014-07-08 DIAGNOSIS — I10 Essential (primary) hypertension: Secondary | ICD-10-CM

## 2014-07-08 DIAGNOSIS — R194 Change in bowel habit: Secondary | ICD-10-CM | POA: Diagnosis not present

## 2014-07-08 DIAGNOSIS — N183 Chronic kidney disease, stage 3 unspecified: Secondary | ICD-10-CM

## 2014-07-08 DIAGNOSIS — R634 Abnormal weight loss: Secondary | ICD-10-CM

## 2014-07-08 DIAGNOSIS — E785 Hyperlipidemia, unspecified: Secondary | ICD-10-CM

## 2014-07-08 DIAGNOSIS — E1121 Type 2 diabetes mellitus with diabetic nephropathy: Secondary | ICD-10-CM

## 2014-07-08 DIAGNOSIS — L02419 Cutaneous abscess of limb, unspecified: Secondary | ICD-10-CM | POA: Diagnosis not present

## 2014-07-08 DIAGNOSIS — R630 Anorexia: Secondary | ICD-10-CM

## 2014-07-08 DIAGNOSIS — M4802 Spinal stenosis, cervical region: Secondary | ICD-10-CM

## 2014-07-08 MED ORDER — CEFTRIAXONE SODIUM 1 G IJ SOLR
500.0000 mg | Freq: Once | INTRAMUSCULAR | Status: AC
  Administered 2014-07-08: 500 mg via INTRAMUSCULAR

## 2014-07-08 MED ORDER — DOXYCYCLINE HYCLATE 100 MG PO TABS: 100.0000 mg | ORAL_TABLET | Freq: Two times a day (BID) | ORAL | Status: DC

## 2014-07-08 NOTE — Patient Instructions (Addendum)
F/u in 4 weeks, call if you need me sooner  Rocephin in office , doxycycline sent in and you are referred to Dr Arnoldo Morale to evaluate the leg also, you will be called with the appt  II plan to have your GI Doc evaluate you since you have poor appetite , weight loss and change in stool,   The tingling and discomfort in hands is likely  From the spiinal stenosis, we may need pain specialist  To help with pain maangement if this continues to be a major concern  Labs today will be forwarded to Dr Dorris Fetch along with the fact that you have been experiencing sugar lows, he may call you in sooner  Pls be careful not to fall or hurt yourself

## 2014-07-08 NOTE — Progress Notes (Signed)
Subjective:    Patient ID: [name] [name], female    DOB: 29-Jul-1929, 81 y.o.   MRN: [id number]  HPI 2 week h/o red area on right leg which is painful states several days ago she had the largest BM , then multiple episodes of loose stool, this is not the normal pattern for her  Feels unwell, no energy, poor appetite C/o episodes in the past 2 weeks with FBG in the 70's  C/o numbness and tingling and burning in both hands , getting worse    Review of Systems See HPI Denies recent fever or chills. Denies sinus pressure, nasal congestion, ear pain or sore throat. Denies chest congestion, productive cough or wheezing. Denies chest pains, palpitations and leg swelling Denies abdominal pain, nausea, vomiting,diarrhea or constipation.   Denies dysuria, frequency, hesitancy or incontinence. Chronic  joint pain, swelling and limitation in mobility.Worsening  Denies headaches, seizures, has increased  numbness,  Tingling. And burning pain in both hands Denies  Uncontrolled depression, anxiety or insomnia.  .        Objective:   Physical Exam  BP 180/86 mmHg  Pulse 93  Temp(Src) 99.7 F (37.6 C)  Resp 18  Wt 170 lb 1.9 oz (77.166 kg)  SpO2 91%  Patient alert and oriented and in no cardiopulmonary distress.  HEENT: No facial asymmetry, EOMI,   oropharynx pink and moist.  Neck decreased ROM, no JVD, no mass.  Chest: Clear to auscultation bilaterally.  CVS: S1, S2 no murmurs, no S3.Regular rate.  ABD: Soft non tender.   Ext: No edema  MS: Decreased ROM spine, shoulders, hips and knees.  Skin: ulcerations  Noted.on right leg max diameter approx 6 cm  Psych: Good eye contact, normal affect. Memory intact not anxious or depressed appearing.  CNS: CN 2-12 intact,  Grade 4 power in hands    noted.       Assessment & Plan:  Cellulitis and abscess of leg 2 week h/o right calf redness, dark area, getting larger, h/o preceeding trauma  Rocephin in office , doxycycline  and surgery consult asap  Diabetes mellitus with nephropathy Reports hypoglycemic episodes, has [poor appetite, managed by endo, updated lab today Ms. Gribble is reminded of the importance of commitment to daily physical activity for 30 minutes or more, as able and the need to limit carbohydrate intake to 30 to 60 grams per meal to help with blood sugar control.   The need to take medication as prescribed, test blood sugar as directed, and to call between visits if there is a concern that blood sugar is uncontrolled is also discussed.   Ms. Bornemann is reminded of the importance of daily foot exam, annual eye examination, and good blood sugar, blood pressure and cholesterol control.  Diabetic Labs Latest Ref Rng 08/14/2014 07/08/2014 05/04/2014 02/22/2014 02/08/2014  HbA1c <5.7 % - - - 8.8(H) -  Microalbumin <2.0 mg/dL - - 0.2 - -  Micro/Creat Ratio 0.0 - 30.0 mg/g - - 6.9 - -  Chol 125 - 200 mg/dL 158 - - - -  HDL >=46 mg/dL 70 - - - -  Calc LDL <130 mg/dL 59 - - - -  Triglycerides <150 mg/dL 143 - - - -  Creatinine 0.60 - 0.88 mg/dL 1.35(H) 1.54(H) - 1.99(H) 1.70(H)   BP/Weight 08/20/2014 08/09/2014 07/08/2014 05/04/2014 04/27/2014 06/27/9483 04/06/2701  Systolic BP - 500 938 182 94 993 716  Diastolic BP - 62 86 62 41 67 66  Wt. (Lbs) 167 167.12  170.12 163 - 166.6 168.08  BMI 30.54 30.56 31.11 29.81 - 30.46 30.73   Foot/eye exam completion dates Latest Ref Rng 05/04/2014 10/15/2013  Eye Exam No Retinopathy - No Retinopathy  Foot exam Order - - -  Foot Form Completion - Done -         Hyperlipidemia with target LDL less than 100 Hyperlipidemia:Low fat diet discussed and encouraged.   Lipid Panel  Lab Results  Component Value Date   CHOL 158 08/14/2014   HDL 70 08/14/2014   LDLCALC 59 08/14/2014   TRIG 143 08/14/2014   CHOLHDL 2.3 08/14/2014   Controlled, no change in medication      Essential hypertension Uncontrolled, return in 4 weeks. Has not been complkiant with medication since  acute illness, compkiance is stressed DASH diet and commitment to daily physical activity for a minimum of 30 minutes discussed and encouraged, as a part of hypertension management. The importance of attaining a healthy weight is also discussed.  BP/Weight 08/20/2014 08/09/2014 07/08/2014 05/04/2014 04/27/2014 06/24/7626 03/02/5174  Systolic BP - 160 737 106 94 269 485  Diastolic BP - 62 86 62 41 67 66  Wt. (Lbs) 167 167.12 170.12 163 - 166.6 168.08  BMI 30.54 30.56 31.11 29.81 - 30.46 30.73        CKD (chronic kidney disease) stage 3, GFR 30-59 ml/min Followed by nephrology, most recent lab shows improvement  Spinal stenosis in cervical region Progressively worsening as c/o weakness in hands with objective signs, also new spasms, needs re imaging of c spine and neurosurg eval  Change in stool habits Change in stool  With poor appetite and weight loss, refer to GI for furhter eval

## 2014-07-08 NOTE — Assessment & Plan Note (Signed)
2 week h/o right calf redness, dark area, getting larger, h/o preceeding trauma  Rocephin in office , doxycycline and surgery consult asap

## 2014-07-09 LAB — CBC WITH DIFFERENTIAL/PLATELET
Basophils Absolute: 0 10*3/uL (ref 0.0–0.1)
Basophils Relative: 0 % (ref 0–1)
EOS PCT: 2 % (ref 0–5)
Eosinophils Absolute: 0.1 10*3/uL (ref 0.0–0.7)
HEMATOCRIT: 34.2 % — AB (ref 36.0–46.0)
Hemoglobin: 11.3 g/dL — ABNORMAL LOW (ref 12.0–15.0)
Lymphocytes Relative: 27 % (ref 12–46)
Lymphs Abs: 1.6 10*3/uL (ref 0.7–4.0)
MCH: 30.8 pg (ref 26.0–34.0)
MCHC: 33 g/dL (ref 30.0–36.0)
MCV: 93.2 fL (ref 78.0–100.0)
MONO ABS: 0.4 10*3/uL (ref 0.1–1.0)
MONOS PCT: 6 % (ref 3–12)
MPV: 10.6 fL (ref 8.6–12.4)
Neutro Abs: 3.9 10*3/uL (ref 1.7–7.7)
Neutrophils Relative %: 65 % (ref 43–77)
Platelets: 251 10*3/uL (ref 150–400)
RBC: 3.67 MIL/uL — ABNORMAL LOW (ref 3.87–5.11)
RDW: 14.1 % (ref 11.5–15.5)
WBC: 6 10*3/uL (ref 4.0–10.5)

## 2014-07-09 LAB — COMPLETE METABOLIC PANEL WITH GFR
ALT: 16 U/L (ref 0–35)
AST: 25 U/L (ref 0–37)
Albumin: 4.3 g/dL (ref 3.5–5.2)
Alkaline Phosphatase: 33 U/L — ABNORMAL LOW (ref 39–117)
BUN: 44 mg/dL — ABNORMAL HIGH (ref 6–23)
CALCIUM: 9.6 mg/dL (ref 8.4–10.5)
CHLORIDE: 102 meq/L (ref 96–112)
CO2: 25 meq/L (ref 19–32)
Creat: 1.54 mg/dL — ABNORMAL HIGH (ref 0.50–1.10)
GFR, Est African American: 36 mL/min — ABNORMAL LOW
GFR, Est Non African American: 31 mL/min — ABNORMAL LOW
Glucose, Bld: 220 mg/dL — ABNORMAL HIGH (ref 70–99)
POTASSIUM: 4.9 meq/L (ref 3.5–5.3)
SODIUM: 139 meq/L (ref 135–145)
TOTAL PROTEIN: 7.3 g/dL (ref 6.0–8.3)
Total Bilirubin: 0.4 mg/dL (ref 0.2–1.2)

## 2014-07-13 ENCOUNTER — Telehealth: Payer: Self-pay | Admitting: *Deleted

## 2014-07-13 NOTE — Telephone Encounter (Signed)
Pt called requesting to speak with Loma Sousa, Please call pt.

## 2014-07-14 NOTE — Telephone Encounter (Signed)
Based on what she presented with at viasit when I saw her I do still recommend she see Dr Arnoldo Morale, a 2nd set of eyes is always good, and her leg was very concerning/ bad last week, pls explain

## 2014-07-14 NOTE — Telephone Encounter (Signed)
Returned patient call.  Unable to leave message. Will await patient return call.

## 2014-07-14 NOTE — Telephone Encounter (Signed)
Spoke with patient.  She states that leg is improving.  She is now only having problems with edema.  Would like to know if you still advise that she see Dr. Arnoldo Morale.

## 2014-07-16 ENCOUNTER — Other Ambulatory Visit: Payer: Self-pay

## 2014-07-16 MED ORDER — OXYCODONE-ACETAMINOPHEN 10-325 MG PO TABS: ORAL_TABLET | ORAL | Status: DC

## 2014-07-20 ENCOUNTER — Telehealth: Payer: Self-pay | Admitting: *Deleted

## 2014-07-20 NOTE — Telephone Encounter (Signed)
Referral has been sent to Dr. Arnoldo Morale on this patient.

## 2014-07-20 NOTE — Telephone Encounter (Signed)
Referral was put on hold because pt didn't think she needed to go but after hearing what Dr had to say she decided to go. [name] [name] aware and will fax notes to [employer]

## 2014-07-21 NOTE — Telephone Encounter (Signed)
Pt has a appt with Dr. Arnoldo Morale 07/27/14 at 11:30 and I called and made pt aware of this appt.

## 2014-07-23 ENCOUNTER — Other Ambulatory Visit: Payer: Self-pay | Admitting: Family Medicine

## 2014-08-04 ENCOUNTER — Telehealth: Payer: Self-pay | Admitting: *Deleted

## 2014-08-04 ENCOUNTER — Other Ambulatory Visit: Payer: Self-pay

## 2014-08-04 MED ORDER — INSULIN DETEMIR 100 UNIT/ML FLEXPEN: 30.0000 [IU] | PEN_INJECTOR | Freq: Every day | SUBCUTANEOUS | Status: DC

## 2014-08-04 NOTE — Telephone Encounter (Signed)
Dr Dorris Fetch and staff are out of the office this week. Wants to know if you will refill her levemir pen 30 units daily to [facility] so she doesn't run out. Please advise

## 2014-08-04 NOTE — Telephone Encounter (Signed)
Patient aware med sent

## 2014-08-04 NOTE — Telephone Encounter (Signed)
Pt called stating she needs her insulin refilled, pt is unsure if Dr. Moshe Cipro can fill it or Dr. Dorris Fetch, pt needs it within the next day or so. Please advise Dr. Liliane Channel office is closed this week.

## 2014-08-04 NOTE — Telephone Encounter (Signed)
Es pls refill

## 2014-08-05 ENCOUNTER — Ambulatory Visit: Payer: Commercial Managed Care - HMO | Admitting: Family Medicine

## 2014-08-09 ENCOUNTER — Telehealth: Payer: Self-pay | Admitting: *Deleted

## 2014-08-09 ENCOUNTER — Encounter: Payer: Self-pay | Admitting: Family Medicine

## 2014-08-09 ENCOUNTER — Ambulatory Visit (INDEPENDENT_AMBULATORY_CARE_PROVIDER_SITE_OTHER): Payer: Medicare HMO | Admitting: Family Medicine

## 2014-08-09 VITALS — BP 114/62 | HR 64 | Resp 18 | Ht 62.0 in | Wt 167.1 lb

## 2014-08-09 DIAGNOSIS — F329 Major depressive disorder, single episode, unspecified: Secondary | ICD-10-CM

## 2014-08-09 DIAGNOSIS — M4802 Spinal stenosis, cervical region: Secondary | ICD-10-CM

## 2014-08-09 DIAGNOSIS — E785 Hyperlipidemia, unspecified: Secondary | ICD-10-CM

## 2014-08-09 DIAGNOSIS — M79601 Pain in right arm: Secondary | ICD-10-CM

## 2014-08-09 DIAGNOSIS — F418 Other specified anxiety disorders: Secondary | ICD-10-CM

## 2014-08-09 DIAGNOSIS — F419 Anxiety disorder, unspecified: Secondary | ICD-10-CM

## 2014-08-09 DIAGNOSIS — L02419 Cutaneous abscess of limb, unspecified: Secondary | ICD-10-CM

## 2014-08-09 DIAGNOSIS — I10 Essential (primary) hypertension: Secondary | ICD-10-CM | POA: Diagnosis not present

## 2014-08-09 DIAGNOSIS — L03119 Cellulitis of unspecified part of limb: Secondary | ICD-10-CM

## 2014-08-09 DIAGNOSIS — E1121 Type 2 diabetes mellitus with diabetic nephropathy: Secondary | ICD-10-CM

## 2014-08-09 DIAGNOSIS — R296 Repeated falls: Secondary | ICD-10-CM

## 2014-08-09 DIAGNOSIS — Z13820 Encounter for screening for osteoporosis: Secondary | ICD-10-CM

## 2014-08-09 DIAGNOSIS — R32 Unspecified urinary incontinence: Secondary | ICD-10-CM

## 2014-08-09 DIAGNOSIS — F32A Depression, unspecified: Secondary | ICD-10-CM

## 2014-08-09 MED ORDER — MIRABEGRON ER 25 MG PO TB24: 25.0000 mg | ORAL_TABLET | Freq: Every day | ORAL | Status: DC

## 2014-08-09 MED ORDER — KETOROLAC TROMETHAMINE 60 MG/2ML IM SOLN
60.0000 mg | Freq: Once | INTRAMUSCULAR | Status: AC
  Administered 2014-08-09: 60 mg via INTRAMUSCULAR

## 2014-08-09 NOTE — Telephone Encounter (Signed)
Pt has a appt with [facility] for her bone density test 08/13/14 at 2:15 I made her aware not to take any calcium medications after Wednesday per Olivia Mackie and to bring a list of her current medications. Pt is aware.

## 2014-08-09 NOTE — Patient Instructions (Addendum)
Annual wellness as before end September  Fasting lipid, cmp and EGFR and vit D With August lab for Dr Luretha Murphy are referred for bone density test  Call if you deicde on MRI of your neck, toradol 60 mg IM in office for neck and right arm pain   New for incontinence is mybetriq stop Lisbeth Ply once you get this  Please  Wound much improved, let it continue to heal on its own, surgery has already evaluated and recommended conservative management. DO NOT pick scab  Care with falling

## 2014-08-10 NOTE — Telephone Encounter (Signed)
Noted  

## 2014-08-12 ENCOUNTER — Telehealth: Payer: Self-pay | Admitting: Family Medicine

## 2014-08-12 DIAGNOSIS — M542 Cervicalgia: Secondary | ICD-10-CM

## 2014-08-12 NOTE — Telephone Encounter (Signed)
Mri entered.

## 2014-08-12 NOTE — Telephone Encounter (Signed)
Patient is calling requesting MRI of the neck to be scheduled, not on a Mon or Wed, please advise?

## 2014-08-13 ENCOUNTER — Ambulatory Visit (HOSPITAL_COMMUNITY)
Admission: RE | Admit: 2014-08-13 | Discharge: 2014-08-13 | Disposition: A | Discharge status: Home / Self Care | Payer: Commercial Managed Care - HMO | Source: Ambulatory Visit | Attending: Family Medicine | Admitting: Family Medicine

## 2014-08-13 DIAGNOSIS — Z13820 Encounter for screening for osteoporosis: Secondary | ICD-10-CM

## 2014-08-13 DIAGNOSIS — Z780 Asymptomatic menopausal state: Secondary | ICD-10-CM | POA: Insufficient documentation

## 2014-08-15 LAB — LIPID PANEL
CHOLESTEROL: 158 mg/dL (ref 125–200)
HDL: 70 mg/dL (ref >=46)
LDL Cholesterol: 59 mg/dL (ref <130)
Total CHOL/HDL Ratio: 2.3 Ratio (ref <=5.0)
Triglycerides: 143 mg/dL (ref <150)
VLDL: 29 mg/dL (ref <30)

## 2014-08-15 LAB — COMPLETE METABOLIC PANEL WITH GFR
ALT: 13 U/L (ref 6–29)
AST: 21 U/L (ref 10–35)
Albumin: 4.3 g/dL (ref 3.6–5.1)
Alkaline Phosphatase: 35 U/L (ref 33–130)
BUN: 30 mg/dL — ABNORMAL HIGH (ref 7–25)
CO2: 30 mmol/L (ref 20–31)
CREATININE: 1.35 mg/dL — AB (ref 0.60–0.88)
Calcium: 9.9 mg/dL (ref 8.6–10.4)
Chloride: 102 mmol/L (ref 98–110)
GFR, EST AFRICAN AMERICAN: 42 mL/min — AB (ref >=60)
GFR, Est Non African American: 36 mL/min — ABNORMAL LOW (ref >=60)
Glucose, Bld: 117 mg/dL — ABNORMAL HIGH (ref 65–99)
POTASSIUM: 4.5 mmol/L (ref 3.5–5.3)
Sodium: 140 mmol/L (ref 135–146)
Total Bilirubin: 0.4 mg/dL (ref 0.2–1.2)
Total Protein: 7.1 g/dL (ref 6.1–8.1)

## 2014-08-16 LAB — VITAMIN D 25 HYDROXY (VIT D DEFICIENCY, FRACTURES): Vit D, 25-Hydroxy: 24 ng/mL — ABNORMAL LOW (ref 30–100)

## 2014-08-18 MED ORDER — VITAMIN D (ERGOCALCIFEROL) 1.25 MG (50000 UNIT) PO CAPS: 50000.0000 [IU] | ORAL_CAPSULE | ORAL | Status: DC

## 2014-08-20 ENCOUNTER — Other Ambulatory Visit: Payer: Self-pay

## 2014-08-20 ENCOUNTER — Ambulatory Visit (HOSPITAL_COMMUNITY)
Admission: RE | Admit: 2014-08-20 | Discharge: 2014-08-20 | Disposition: A | Discharge status: Home / Self Care | Payer: Commercial Managed Care - HMO | Source: Ambulatory Visit | Attending: Family Medicine | Admitting: Family Medicine

## 2014-08-20 DIAGNOSIS — M1288 Other specific arthropathies, not elsewhere classified, other specified site: Secondary | ICD-10-CM | POA: Diagnosis not present

## 2014-08-20 DIAGNOSIS — M4802 Spinal stenosis, cervical region: Secondary | ICD-10-CM | POA: Insufficient documentation

## 2014-08-20 DIAGNOSIS — M542 Cervicalgia: Secondary | ICD-10-CM | POA: Diagnosis present

## 2014-08-20 MED ORDER — OXYCODONE-ACETAMINOPHEN 10-325 MG PO TABS: ORAL_TABLET | ORAL | Status: DC

## 2014-08-27 ENCOUNTER — Telehealth: Payer: Self-pay

## 2014-08-27 DIAGNOSIS — R937 Abnormal findings on diagnostic imaging of other parts of musculoskeletal system: Secondary | ICD-10-CM

## 2014-08-27 NOTE — Telephone Encounter (Signed)
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

## 2014-08-27 NOTE — Telephone Encounter (Signed)
Referral entered  

## 2014-08-29 DIAGNOSIS — M4802 Spinal stenosis, cervical region: Secondary | ICD-10-CM | POA: Insufficient documentation

## 2014-08-29 NOTE — Assessment & Plan Note (Signed)
Change in stool  With poor appetite and weight loss, refer to GI for furhter eval

## 2014-08-29 NOTE — Assessment & Plan Note (Signed)
Uncontrolled dose adjustment in medication

## 2014-08-29 NOTE — Assessment & Plan Note (Signed)
Controlled, no change in medication  

## 2014-08-29 NOTE — Assessment & Plan Note (Addendum)
Progressively worsening as c/o weakness in hands with objective signs, also new spasms, needs re imaging of c spine and neurosurg eval

## 2014-08-29 NOTE — Progress Notes (Signed)
   Subjective:    Patient ID: [name] [name], female    DOB: 23-Sep-1929, 81 y.o.   MRN: [id number]  HPI Pt n prinmarily for re evaluation of abscess on ;leg, she has seen surgery, no intervention was needed, and the wound is essentially 90 % healed, she unfortunately has been picking  The scab which has caused delay in healing entirely Denies pain or drainage C/o uncontrolled incontinence on current medication C/o uncontrolled neck pain radiating to hand , wants shot for this and any additional help she can get Also here for re eval of uncontrolled BP at last visit when she was non compliant with her meds which is unlike her, but because she just was not feeling well   Review of Systems See HPI    Denies recent fever or chills. Denies sinus pressure, nasal congestion, ear pain or sore throat. Denies chest congestion, productive cough or wheezing. Denies chest pains, palpitations and leg swelling Denies abdominal pain, nausea, vomiting,diarrhea or constipation.   Increased Denies joint pain, swelling and limitation in mobility. Denies headaches, seizures, numbness, or tingling. Denies depression, has mild increase  anxiety and  insomnia.    Objective:   Physical Exam  BP 114/62 mmHg  Pulse 64  Resp 18  Ht 5\' 2"  (1.575 m)  Wt 167 lb 1.9 oz (75.805 kg)  BMI 30.56 kg/m2  SpO2 95% Patient alert and oriented and in no cardiopulmonary distress.  HEENT: No facial asymmetry, EOMI,   oropharynx pink and moist.  Neck decreased  no JVD, no mass.  Chest: Clear to auscultation bilaterally.  CVS: S1, S2 , systolic  murmurs, no S3.Regular rate.  ABD: Soft non tender.   Ext: No edema  MS: Markedly decreased  ROM spine, shoulders, hips and knees.  Skin: Leg ulcer max diameter approx 1.5 cm, no purulent drainage , warmth or erythema, obvious areas of disruption where pot has been picking off scab.  Psych: Good eye contact, normal affect. Memory intact mildly  anxious and  depressed  appearing.  CNS: CN 2-12 intact, grade 4 power in upper extremities       Assessment & Plan:  Spinal stenosis in cervical region Increased and uncontrolled pain radiating to right arm Uncontrolled.Toradol l administered IM in the office , pt to call back for MRI if pain persits   Urinary incontinence Uncontrolled dose adjustment in medication  Multiple falls Home safety reviewed at visit  Cellulitis and abscess of leg Marked improvement , pt unfortunately picking at scab, educated about stopping this to allow the ulcer to fully heal. Has already had surgical evaluation , no intervention needed  Anxiety and depression Slight increase in anxiety, states she is "falling apart" depression controlled, no change in medication  Hopefully there will be improvement in her physical health  Essential hypertension Controlled, no change in medication

## 2014-08-29 NOTE — Assessment & Plan Note (Signed)
Followed by nephrology, most recent lab shows improvement

## 2014-08-29 NOTE — Assessment & Plan Note (Signed)
Increased and uncontrolled pain radiating to right arm Uncontrolled.Toradol l administered IM in the office , pt to call back for MRI if pain persits

## 2014-08-29 NOTE — Assessment & Plan Note (Signed)
Reports hypoglycemic episodes, has [poor appetite, managed by endo, updated lab today [name] [name] is reminded of the importance of commitment to daily physical activity for 30 minutes or more, as able and the need to limit carbohydrate intake to 30 to 60 grams per meal to help with blood sugar control.   The need to take medication as prescribed, test blood sugar as directed, and to call between visits if there is a concern that blood sugar is uncontrolled is also discussed.   [name] [name] is reminded of the importance of daily foot exam, annual eye examination, and good blood sugar, blood pressure and cholesterol control.  Diabetic Labs Latest Ref Rng 08/14/2014 07/08/2014 05/04/2014 02/22/2014 02/08/2014  HbA1c <5.7 % - - - 8.8(H) -  Microalbumin <2.0 mg/dL - - 0.2 - -  Micro/Creat Ratio 0.0 - 30.0 mg/g - - 6.9 - -  Chol 125 - 200 mg/dL 158 - - - -  HDL >=46 mg/dL 70 - - - -  Calc LDL <130 mg/dL 59 - - - -  Triglycerides <150 mg/dL 143 - - - -  Creatinine 0.60 - 0.88 mg/dL 1.35(H) 1.54(H) - 1.99(H) 1.70(H)   BP/Weight 08/20/2014 08/09/2014 07/08/2014 05/04/2014 04/27/2014 07/11/6577 0/03/8331  Systolic BP - 832 919 166 94 060 045  Diastolic BP - 62 86 62 41 67 66  Wt. (Lbs) 167 167.12 170.12 163 - 166.6 168.08  BMI 30.54 30.56 31.11 29.81 - 30.46 30.73   Foot/eye exam completion dates Latest Ref Rng 05/04/2014 10/15/2013  Eye Exam No Retinopathy - No Retinopathy  Foot exam Order - - -  Foot Form Completion - Done -

## 2014-08-29 NOTE — Assessment & Plan Note (Signed)
Uncontrolled, return in 4 weeks. Has not been complkiant with medication since acute illness, compkiance is stressed DASH diet and commitment to daily physical activity for a minimum of 30 minutes discussed and encouraged, as a part of hypertension management. The importance of attaining a healthy weight is also discussed.  BP/Weight 08/20/2014 08/09/2014 07/08/2014 05/04/2014 04/27/2014 5/72/6203 05/06/9739  Systolic BP - 638 453 646 94 803 212  Diastolic BP - 62 86 62 41 67 66  Wt. (Lbs) 167 167.12 170.12 163 - 166.6 168.08  BMI 30.54 30.56 31.11 29.81 - 30.46 30.73

## 2014-08-29 NOTE — Assessment & Plan Note (Signed)
Slight increase in anxiety, states she is "falling apart" depression controlled, no change in medication  Hopefully there will be improvement in her physical health

## 2014-08-29 NOTE — Assessment & Plan Note (Signed)
Home safety reviewed at visit 

## 2014-08-29 NOTE — Assessment & Plan Note (Signed)
Hyperlipidemia:Low fat diet discussed and encouraged.   Lipid Panel  Lab Results  Component Value Date   CHOL 158 08/14/2014   HDL 70 08/14/2014   LDLCALC 59 08/14/2014   TRIG 143 08/14/2014   CHOLHDL 2.3 08/14/2014   Controlled, no change in medication

## 2014-08-29 NOTE — Assessment & Plan Note (Signed)
Marked improvement , pt unfortunately picking at scab, educated about stopping this to allow the ulcer to fully heal. Has already had surgical evaluation , no intervention needed

## 2014-08-30 ENCOUNTER — Encounter: Payer: Self-pay | Admitting: Internal Medicine

## 2014-09-02 ENCOUNTER — Other Ambulatory Visit: Payer: Self-pay | Admitting: Family Medicine

## 2014-09-17 ENCOUNTER — Ambulatory Visit: Payer: Commercial Managed Care - HMO | Admitting: Gastroenterology

## 2014-09-20 ENCOUNTER — Ambulatory Visit: Payer: Medicare HMO | Admitting: Cardiology

## 2014-09-21 ENCOUNTER — Encounter: Payer: Self-pay | Admitting: Internal Medicine

## 2014-09-22 ENCOUNTER — Other Ambulatory Visit: Payer: Self-pay | Admitting: Family Medicine

## 2014-09-23 ENCOUNTER — Other Ambulatory Visit: Payer: Self-pay

## 2014-09-23 MED ORDER — OXYCODONE-ACETAMINOPHEN 10-325 MG PO TABS: ORAL_TABLET | ORAL | Status: DC

## 2014-09-28 ENCOUNTER — Encounter: Payer: Self-pay | Admitting: Family Medicine

## 2014-09-28 ENCOUNTER — Ambulatory Visit (INDEPENDENT_AMBULATORY_CARE_PROVIDER_SITE_OTHER): Payer: Medicare HMO | Admitting: Family Medicine

## 2014-09-28 VITALS — BP 104/66 | HR 50 | Resp 18 | Ht 62.0 in | Wt 168.1 lb

## 2014-09-28 DIAGNOSIS — Z23 Encounter for immunization: Secondary | ICD-10-CM | POA: Diagnosis not present

## 2014-09-28 DIAGNOSIS — E1121 Type 2 diabetes mellitus with diabetic nephropathy: Secondary | ICD-10-CM

## 2014-09-28 DIAGNOSIS — Z0000 Encounter for general adult medical examination without abnormal findings: Secondary | ICD-10-CM

## 2014-09-28 NOTE — Assessment & Plan Note (Signed)

## 2014-09-28 NOTE — Patient Instructions (Signed)
F/u in 4 month, call if you need me before  Flu vaccine today  You are referred for [facility] social workers to come to your home to see what help you can get as you are asking about respite care and often say you are feeling weak  Please continue to be careful not to fall    You are refered to Dr Iona Hansen for diabetic eye exam in Oct 16 or after

## 2014-09-28 NOTE — Assessment & Plan Note (Signed)
After obtaining informed consent, the vaccine is  administered by LPN.  

## 2014-09-28 NOTE — Patient Outreach (Signed)
[facility]) Care Management  09/28/2014  Chelsae Zanella Depasquale 04/28/1929 [id number]   Referral from MD via EPIC for SW, assigned Scott [name], LCSW.  Thanks, Ronnell Freshwater. [place], [place] Assistant Phone: [phone] Fax: [phone]

## 2014-09-28 NOTE — Progress Notes (Signed)
Subjective:    Patient ID: [name] [name], female    DOB: 07-31-1929, 81 y.o.   MRN: [id number]  HPI Preventive Screening-Counseling & Management   Patient present here today for a Medicare annual wellness visit.   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 No  Over the past two weeks, have you felt little interest or pleasure in doing things? No  Have you lost interest or pleasure in daily life? No  Do you often feel hopeless? No  Do you cry easily over simple problems? No   Activities of Daily Living  In your present state of health, do you have any difficulty performing the following activities?  Driving?: Yes, husband drives Managing money?: No Feeding yourself?:No Getting from bed to chair?:No Climbing a flight of stairs?: Yes due to mobility Preparing food and eating?:No Bathing or showering?:challenged but cares for herself Getting dressed?limited Getting to the toilet?:No Using the toilet?:No Moving around from place to place?: Yes  Fall Risk Assessment In the past year have you fallen or had a near fall?:Yes, four Are you currently taking any medications that make you dizzy?:No   Hearing Difficulties: No Do you often ask people to speak up or repeat themselves?:No Do you experience ringing or noises in your ears?:No Do you have difficulty understanding soft or whispered voices?:No  Cognitive Testing  Alert? Yes Normal Appearance?Yes  Oriented  to person? Yes Place? Yes  Time? Yes  Displays appropriate judgment?Yes  Can read the correct time from a watch face? yes Are you having problems remembering things?No, age appropriate  Advanced Directives have been discussed with the patient?Yes and brochure provided , full code   List the Names of Other Physician/Practitioners you currently use: careteams updated    Indicate any recent Medical Services you may have received from other than [facility] providers in the past year (date may be approximate).   Assessment:    Annual Wellness Exam   Plan:     Medicare Attestation  I have personally reviewed:  The patient's medical and social history  Their use of alcohol, tobacco or illicit drugs  Their current medications and supplements  The patient's functional ability including ADLs,fall risks, home safety risks, cognitive, and hearing and visual impairment  Diet and physical activities  Evidence for depression or mood disorders  The patient's weight, height, BMI, and visual acuity have been recorded in the chart. I have made referrals, counseling, and provided education to the patient based on review of the above and I have provided the patient with a written personalized care plan for preventive services.      Review of Systems     Objective:   Physical Exam  BP 104/66 mmHg  Pulse 50  Resp 18  Ht 5\' 2"  (1.575 m)  Wt 168 lb 1.9 oz (76.259 kg)  BMI 30.74 kg/m2  SpO2 93%       Assessment & Plan:  Medicare annual wellness visit, subsequent Annual  exam as documented. Counseling done  re healthy lifestyle involving commitment to 150 minutes exercise per week, heart healthy diet, and attaining healthy weight.The importance of adequate sleep also discussed. Regular seat belt use and home safety, is also discussed. Changes in health habits are decided on by the patient with goals and time frames  set for achieving them. Immunization and cancer screening needs are  specifically addressed at this visit.   Need for prophylactic vaccination and inoculation against influenza After obtaining informed consent, the vaccine is  administered by LPN.

## 2014-09-29 ENCOUNTER — Ambulatory Visit (INDEPENDENT_AMBULATORY_CARE_PROVIDER_SITE_OTHER): Payer: Commercial Managed Care - HMO | Admitting: Cardiology

## 2014-09-29 ENCOUNTER — Encounter: Payer: Self-pay | Admitting: Cardiology

## 2014-09-29 ENCOUNTER — Encounter: Payer: Self-pay | Admitting: Licensed Clinical Social Worker

## 2014-09-29 VITALS — BP 118/65 | HR 56 | Ht 62.0 in | Wt 168.1 lb

## 2014-09-29 DIAGNOSIS — I779 Disorder of arteries and arterioles, unspecified: Secondary | ICD-10-CM

## 2014-09-29 DIAGNOSIS — I739 Peripheral vascular disease, unspecified: Secondary | ICD-10-CM

## 2014-09-29 DIAGNOSIS — I4892 Unspecified atrial flutter: Secondary | ICD-10-CM | POA: Diagnosis not present

## 2014-09-29 NOTE — Progress Notes (Signed)
Cardiology Office Note   Date:  09/29/2014   ID:  [name] [name], DOB 1929/08/14, MRN [id number]  PCP:  Tula Nakayama, MD  Cardiologist:  Dola Argyle, MD   Chief Complaint  Patient presents with  . Appointment    Follow-up history of atrial flutter      History of Present Illness: [name] [name] is a 81 y.o. female who presents to follow-up history of atrial flutter. She is doing very well. I first saw her for atrial flutter in 2008. At that time we anticoagulated her and proceeded with cardioversion. She has held sinus rhythm since then. Over time she started having falling difficulties and her Coumadin had to be stopped. She continues to be stable from the cardiac viewpoint.  Past Medical History  Diagnosis Date  . Atrial flutter febuary 2008       atrial flutter... DC Cardioversion...successful... holding sinus as of january 2011 coumadin therapy...discontinued...severe hematoma secondary to fall  . Hypertension   . Hyperlipidemia   . Hypercholesterolemia   . Mitral regurgitation 2010    mild ...echo...2010  . Obesity   . Sleep apnea   . Fatigue   . Depression with anxiety   . Visual loss     unspecified  . Basal cell carcinoma   . Hematochezia   . Hematoma     ...buttocks secondary to fall november 2009 with coumadin  coumadin stopped  . Diabetes mellitus     type 2  . Back pain     with radiculopathy  . Arthritis   . Osteoarthritis   . Acute cystitis   . GERD (gastroesophageal reflux disease)   . Diverticulosis of colon   . Constipation   . IBS (irritable bowel syndrome)   . Abdominal bloating   . Nausea   . Erosive esophagitis   . Aortic valve sclerosis     Ef 60 %...echo...july 2010   . Chronic granulomatous disease     right chest ... ct chest... novmber 2009  . Normal nuclear stress test     september ,2007...no ischemia  . Anticoagulant long-term use     Coumadin  stopped...severe hematoma  from fall  . Edema     Painful, April, 2012  .  Ejection fraction     EF 60-65%, echo, April 13, 2010, normal RV function, mild mitral regurgitation  . Carotid artery disease     Doppler, [facility], February 21, 2012, less than 50% bilateral carotid stenoses    Past Surgical History  Procedure Laterality Date  . Bilateral bunion removel  [id number]  . Replacement total knee bilateral      2000,2001  . Tonsillectomy  1957  . Appendectomy  1948  . Dilation and curettage of uterus  1952 and 2001  . Abdominal hysterectomy  2001  . Cataract extraction  2001    right   . Other surgical history    . Cataract extraction      left 2005  . Basal cell carcinoma excision  09/2009    right forarm and back of neck and left side   . Colonoscopy   07/17/2006    Dr. Rourk:anal canal hemorrhoids, diminutive rectal polyp status post cold biopsy.  Otherwise, normal rectum. Left sided and  transverse diverticula.  Diminutive polyp in the ascending colon, cold bx. hyperplastic polyps.   . Esophagogastroduodenoscopy   07/17/2006    Dr. Rourk:single 2 cm distal esophageal erosion consistent with erosive reflux esophagitis, otherwise normal  . Esophagogastroduodenoscopy N/A  09/10/2013    Dr. Rourk:Schatzki's ring - status post [facility] dilation. Mild erosive reflux esophagitis. Hiatal hernia with fosamax capsule lodged as above  . Savory dilation N/A 09/10/2013    Procedure: SAVORY DILATION;  Surgeon: Daneil Dolin, MD;  [facility];  Service: Endoscopy;  Laterality: N/A;  Venia Minks dilation N/A 09/10/2013    Procedure: Venia Minks DILATION;  Surgeon: Daneil Dolin, MD;  Location: [facility];  Service: Endoscopy;  Laterality: N/A;    Patient Active Problem List   Diagnosis Date Noted  . Medicare annual wellness visit, subsequent 09/28/2014  . Need for prophylactic vaccination and inoculation against influenza 09/28/2014  . Change in stool habits 07/08/2014  . Spinal stenosis in cervical region 02/27/2014  . Loss of weight 12/21/2013    . Osteopenia 10/17/2013  . CKD (chronic kidney disease) stage 3, GFR 30-59 ml/min 10/17/2013  . Dysphagia 08/18/2013  . Bilateral leg weakness 08/17/2013  . Poor balance 08/17/2013  . GERD with stricture 06/29/2013  . Seasonal allergies 06/29/2013  . Abnormal thyroid blood test 03/12/2013  . Carotid artery disease   . Multiple falls 02/15/2012  . Hand fracture 10/26/2011  . Arthritis, shoulder region 05/02/2011  . Torn rotator cuff, left 05/02/2011  . Urinary incontinence 08/04/2010  . Hyperlipidemia with target LDL less than 100   . Aortic valve sclerosis   . Normal nuclear stress test   . Atrial flutter 01/28/2009  . MITRAL REGURGITATION, 0 (MILD) 09/28/2008  . Overweight (BMI 25.0-29.9) 09/12/2008  . GERD 05/11/2008  . Diverticulosis of large intestine 05/11/2008  . CARCINOMA, BASAL CELL 04/30/2008  . EROSIVE ESOPHAGITIS 04/30/2008  . IRRITABLE BOWEL SYNDROME 04/30/2008  . HEMATOCHEZIA, HX OF 04/30/2008  . Back pain of lumbar region with sciatica 11/04/2007  . Diabetes mellitus with nephropathy 03/11/2007  . Anxiety and depression 03/11/2007  . Essential hypertension 03/11/2007  . OSTEOARTHRITIS 03/11/2007      Current Outpatient Prescriptions  Medication Sig Dispense Refill  . ACCU-CHEK AVIVA PLUS test strip USE ONE STRIP THREE TIMES DAILY 100 each 1  . aspirin 81 MG tablet Take 81 mg by mouth daily.      . benazepril-hydrochlorthiazide (LOTENSIN HCT) 20-12.5 MG per tablet Take 2 tablets by mouth daily. 180 tablet 1  . citalopram (CELEXA) 40 MG tablet TAKE 1 TABLET BY MOUTH EVERY DAY 30 tablet 5  . CRESTOR 40 MG tablet TAKE 1 TABLET BY MOUTH EVERY DAILY 30 tablet 2  . diltiazem (DILT-XR) 240 MG 24 hr capsule TAKE 1 CAPSULE BY MOUTH DAILY. 90 capsule 1  . esomeprazole (NEXIUM) 40 MG capsule Take 40 mg by mouth daily at 12 noon.    . fenofibrate (TRICOR) 145 MG tablet Take 1 tablet (145 mg total) by mouth at bedtime. 90 tablet 1  . fish oil-omega-3 fatty acids 1000 MG  capsule Take 1 g by mouth daily. 2 caps bid     . fluticasone (FLONASE) 50 MCG/ACT nasal spray PLACE 2 SPRAYS INTO BOTH NOSTRILS DAILY. 16 g 6  . furosemide (LASIX) 40 MG tablet TAKE 1 TABLET BY MOUTH EVERY DAY AS DIRECTED 30 tablet 11  . gabapentin (NEURONTIN) 100 MG capsule Two capsules three times daily 180 capsule 5  . Insulin Detemir (LEVEMIR FLEXTOUCH) 100 UNIT/ML Pen Inject 30 Units into the skin daily at 10 pm. 15 mL 0  . L-Methylfolate-Algae-B12-B6 (METANX) 3-90.314-2-35 MG CAPS TAKE 1 CAPSULE BY MOUTH EVERY DAY 30 capsule 2  . oxyCODONE-acetaminophen (PERCOCET) 10-325 MG per tablet One  tablet four times daily for severe uncontrolled pain 120 tablet 0  . ranitidine (ZANTAC) 150 MG capsule Take 150 mg by mouth every evening.    . Vitamin D, Ergocalciferol, (DRISDOL) 50000 UNITS CAPS capsule Take 1 capsule (50,000 Units total) by mouth every 7 (seven) days. 4 capsule 11   No current facility-administered medications for this visit.    Allergies:   Lisinopril; Reclast; Alprazolam; Losartan; Sertraline hcl; and Sulfonamide derivatives    Social History:  The patient  reports that she has never smoked. She has never used smokeless tobacco. She reports that she does not drink alcohol or use illicit drugs.   Family History:  The patient's  family history includes Arthritis in an other family member; Dementia in her mother; Diabetes in an other family member; Heart disease in her father. There is no history of Colon cancer.    ROS:  Please see the history of present illness.    Patient denies fever, chills, headache, sweats, rash, change in vision, change in hearing, chest pain, cough, nausea or vomiting, urinary symptoms. All other systems are reviewed and are negative.   PHYSICAL EXAM: VS:  BP 118/65 mmHg  Pulse 56  Ht 5\' 2"  (1.575 m)  Wt 168 lb 1.9 oz (76.259 kg)  BMI 30.74 kg/m2  SpO2 95% , The patient is here with her rolling walker. She is oriented to person time and place.  Affect is normal. Head is atraumatic. Sclera and conjunctiva are normal. There is no jugular venous distention. Lungs are clear. Respiratory effort is not labored. Cardiac exam reveals an S1 with an S2. The abdomen is soft. There is no peripheral edema. The rhythm is regular. There are no skin rashes.   EKG:   EKG is done today and reviewed by me. There is normal sinus rhythm with mild sinus bradycardia. The QRS is normal.   Recent Labs: 03/04/2014: TSH 3.252 07/08/2014: Hemoglobin 11.3*; Platelets 251 08/14/2014: ALT 13; BUN 30*; Creat 1.35*; Potassium 4.5; Sodium 140    Lipid Panel    Component Value Date/Time   CHOL 158 08/14/2014 0804   TRIG 143 08/14/2014 0804   HDL 70 08/14/2014 0804   CHOLHDL 2.3 08/14/2014 0804   VLDL 29 08/14/2014 0804   LDLCALC 59 08/14/2014 0804      Wt Readings from Last 3 Encounters:  09/29/14 168 lb 1.9 oz (76.259 kg)  09/28/14 168 lb 1.9 oz (76.259 kg)  08/20/14 167 lb (75.751 kg)      Current medicines are reviewed  The patient understands her medications.     ASSESSMENT AND PLAN:

## 2014-09-29 NOTE — Assessment & Plan Note (Signed)
The patient had atrial flutter that was cardioverted successfully in 2008. Coumadin was stopped years later because of falling. She has held sinus rhythm since 2008. She is stable. No change in therapy.

## 2014-09-29 NOTE — Patient Instructions (Signed)
Your physician recommends that you continue on your current medications as directed. Please refer to the Current Medication list given to you today. Your physician recommends that you schedule a follow-up appointment in: 2 years. You will receive a reminder letter in the mail in about 20 months reminding you to call and schedule your appointment. If you don't receive this letter, please contact our office.

## 2014-09-29 NOTE — Patient Outreach (Signed)
[facility]) Care Management  [facility] Social Work  09/29/2014  [name] [name] 1929/03/22 [id number]  Subjective:    Objective:   Current Medications:  Current Outpatient Prescriptions  Medication Sig Dispense Refill  . ACCU-CHEK AVIVA PLUS test strip USE ONE STRIP THREE TIMES DAILY 100 each 1  . aspirin 81 MG tablet Take 81 mg by mouth daily.      . benazepril-hydrochlorthiazide (LOTENSIN HCT) 20-12.5 MG per tablet Take 2 tablets by mouth daily. 180 tablet 1  . citalopram (CELEXA) 40 MG tablet TAKE 1 TABLET BY MOUTH EVERY DAY 30 tablet 5  . CRESTOR 40 MG tablet TAKE 1 TABLET BY MOUTH EVERY DAILY 30 tablet 2  . diltiazem (DILT-XR) 240 MG 24 hr capsule TAKE 1 CAPSULE BY MOUTH DAILY. 90 capsule 1  . esomeprazole (NEXIUM) 40 MG capsule Take 40 mg by mouth daily at 12 noon.    . fenofibrate (TRICOR) 145 MG tablet Take 1 tablet (145 mg total) by mouth at bedtime. 90 tablet 1  . fish oil-omega-3 fatty acids 1000 MG capsule Take 1 g by mouth daily. 2 caps bid     . fluticasone (FLONASE) 50 MCG/ACT nasal spray PLACE 2 SPRAYS INTO BOTH NOSTRILS DAILY. 16 g 6  . furosemide (LASIX) 40 MG tablet TAKE 1 TABLET BY MOUTH EVERY DAY AS DIRECTED 30 tablet 11  . gabapentin (NEURONTIN) 100 MG capsule Two capsules three times daily 180 capsule 5  . Insulin Detemir (LEVEMIR FLEXTOUCH) 100 UNIT/ML Pen Inject 30 Units into the skin daily at 10 pm. 15 mL 0  . Insulin Glargine (LANTUS) 100 UNIT/ML Solostar Pen Inject 15 Units into the skin daily.     Marland Kitchen L-Methylfolate-Algae-B12-B6 (METANX) 3-90.314-2-35 MG CAPS TAKE 1 CAPSULE BY MOUTH EVERY DAY 30 capsule 2  . mirabegron ER (MYRBETRIQ) 25 MG TB24 tablet Take 1 tablet (25 mg total) by mouth daily. 30 tablet 3  . oxyCODONE-acetaminophen (PERCOCET) 10-325 MG per tablet One tablet four times daily for severe uncontrolled pain 120 tablet 0  . pantoprazole (PROTONIX) 40 MG tablet TAKE 1 TABLET BY MOUTH EVERY DAY 30 tablet 11  . ranitidine (ZANTAC) 150 MG  capsule Take 150 mg by mouth every evening.    . TRADJENTA 5 MG TABS tablet     . Vitamin D, Ergocalciferol, (DRISDOL) 50000 UNITS CAPS capsule Take 1 capsule (50,000 Units total) by mouth every 7 (seven) days. 4 capsule 11   No current facility-administered medications for this visit.    Functional Status:  In your present state of health, do you have any difficulty performing the following activities: 02/01/2014  Hearing? N  Vision? N  Difficulty concentrating or making decisions? N  Walking or climbing stairs? Y  Dressing or bathing? N  Doing errands, shopping? Y    Fall/Depression Screening:  PHQ 2/9 Scores 08/09/2014 12/10/2012 08/26/2012  PHQ - 2 Score 0 1 0    Assessment:  CSW received referral on client. CSW completed chart review on client on 09/29/14.  Client currently sees Dr. Tula [name] as primary care physician for client. [name] [name], Licensed Practical Nurse for Dr. Moshe [name], had communicated to [name] [name] that client was asking about respite care for client and asking about in home assistance support for client.   CSW called client on 09/29/14 and spoke via phone with client on 09/29/14. CSW verified identity of client.  CSW introduced self and informed client that Dr. Moshe [name] had sent [facility] referral for social work support for client. CSW asked  for permission to speak with client about medical needs of client. Client gave CSW verbal consent on 09/29/14 to speak with client about medical needs of client. Client said she had been going to Dr. Moshe [name] for 14 years for medical care. She said she resides at her home with her spouse, [name] [name].  Client said she has about 3 stairs she had to climb to access her bathroom at her home. She said walking up stairs is difficult for client currently. Client was alert and oriented.  She said she has [name] [name].  She and CSW spoke of scheduling CSW home visit with client. Client and CSW agreed for CSW to visit client for routine home  visit on 09/30/14 at 11:00 AM at home of client. Client said she appreciated call of CSW and was looking forward to [name] home visit with client on 09/30/14.   Plan: Client to take medications as prescribed and attend scheduled medical appointments. Client to communicate, as needed, with Dr. Moshe [name], to discuss medical needs of client. CSW to conduct routine home visit with client on 09/30/14 at 11:00 AM to discuss current needs of client.  [name] [name].[name] [name] MSW, LCSW Licensed Clinical Social Worker [place] Care Management [phone] Plan:

## 2014-09-29 NOTE — Assessment & Plan Note (Signed)
There is a history of mild carotid disease. Doppler in 2014 showed less then 50% bilateral disease. She does not need a Doppler at this time.

## 2014-09-30 ENCOUNTER — Other Ambulatory Visit: Payer: Self-pay | Admitting: Licensed Clinical Social Worker

## 2014-09-30 ENCOUNTER — Other Ambulatory Visit: Payer: Self-pay | Admitting: Family Medicine

## 2014-09-30 ENCOUNTER — Encounter: Payer: Self-pay | Admitting: Licensed Clinical Social Worker

## 2014-09-30 DIAGNOSIS — E118 Type 2 diabetes mellitus with unspecified complications: Secondary | ICD-10-CM

## 2014-09-30 NOTE — Patient Outreach (Signed)
[facility]) Care Management  [facility] Social Work  09/30/2014  Didi Ganaway Gugel [birth year] [id number]  Subjective:    Objective:   Current Medications:  Current Outpatient Prescriptions  Medication Sig Dispense Refill  . ACCU-CHEK AVIVA PLUS test strip USE ONE STRIP THREE TIMES DAILY 100 each 1  . aspirin 81 MG tablet Take 81 mg by mouth daily.      . benazepril-hydrochlorthiazide (LOTENSIN HCT) 20-12.5 MG per tablet Take 2 tablets by mouth daily. 180 tablet 1  . citalopram (CELEXA) 40 MG tablet TAKE 1 TABLET BY MOUTH EVERY DAY 30 tablet 5  . CRESTOR 40 MG tablet TAKE 1 TABLET BY MOUTH EVERY DAILY 30 tablet 2  . diltiazem (DILT-XR) 240 MG 24 hr capsule TAKE 1 CAPSULE BY MOUTH DAILY. 90 capsule 1  . esomeprazole (NEXIUM) 40 MG capsule Take 40 mg by mouth daily at 12 noon.    . fenofibrate (TRICOR) 145 MG tablet Take 1 tablet (145 mg total) by mouth at bedtime. 90 tablet 1  . fish oil-omega-3 fatty acids 1000 MG capsule Take 1 g by mouth daily. 2 caps bid     . fluticasone (FLONASE) 50 MCG/ACT nasal spray PLACE 2 SPRAYS INTO BOTH NOSTRILS DAILY. 16 g 6  . furosemide (LASIX) 40 MG tablet TAKE 1 TABLET BY MOUTH EVERY DAY AS DIRECTED 30 tablet 11  . gabapentin (NEURONTIN) 100 MG capsule Two capsules three times daily 180 capsule 5  . Insulin Detemir (LEVEMIR FLEXTOUCH) 100 UNIT/ML Pen Inject 30 Units into the skin daily at 10 pm. 15 mL 0  . L-Methylfolate-Algae-B12-B6 (METANX) 3-90.314-2-35 MG CAPS TAKE 1 CAPSULE BY MOUTH EVERY DAY 30 capsule 2  . oxyCODONE-acetaminophen (PERCOCET) 10-325 MG per tablet One tablet four times daily for severe uncontrolled pain 120 tablet 0  . ranitidine (ZANTAC) 150 MG capsule Take 150 mg by mouth every evening.    . Vitamin D, Ergocalciferol, (DRISDOL) 50000 UNITS CAPS capsule Take 1 capsule (50,000 Units total) by mouth every 7 (seven) days. 4 capsule 11   No current facility-administered medications for this visit.    Functional Status:   In your present state of health, do you have any difficulty performing the following activities: 09/30/2014 02/01/2014  Hearing? N N  Vision? Y N  Difficulty concentrating or making decisions? Y N  Walking or climbing stairs? Y Y  Dressing or bathing? N N  Doing errands, shopping? Tempie Donning  Preparing Food and eating ? N -  Using the Toilet? N -  In the past six months, have you accidently leaked urine? N -  Do you have problems with loss of bowel control? N -  Managing your Medications? N -  Managing your Finances? N -  Housekeeping or managing your Housekeeping? Y -    Fall/Depression Screening:  PHQ 2/9 Scores 09/30/2014 08/09/2014 12/10/2012 08/26/2012  PHQ - 2 Score 2 0 1 0  PHQ- 9 Score 6 - - -    Assessment:   CSW met with client and spouse of client on 09/30/14 at home of client.  Client gave verbal permission for CSW to talk with client about medical needs of client.  Client uses rolling walking to help her ambulate. She said she has some difficulty climbing stairs.  She has two steps to climb at outside entrance to home; she has about 5 steps to climb in the home when she goes from one level of the home to another.  She has a handrail in place in  home to help her climb 5 steps. Client said she has a home health aide who helps her a few hours weekly through [facility]. CSW and client spoke of in home aide support options for client.  CSW encouraged client to call [employer] to see if [employer] provided any in home aide support for client as part of [employer]. CSW encouraged client to call [facility] [employer] in [place], [place] to talk with [place] worker at agency about in home chore workers available through [employer]   Client said home health  aide helps with cleaning and laundry and home maintenance for client..  Client said she has some pain in her legs when walking.  She spoke of pain in her lower back and pain  in her legs. CSW encouraged client to also call [facility] and talk further with that agency about in home care needs of client.  CSW encouraged client to speak with medical providers regarding pain issues for client. Client said she has appointment with Dr. Carloyn Manner on 10/01/14 to review test results for client.  Dr. Moshe Cipro is primary doctor for client. Client has received medical care from Dr. Moshe Cipro for at least 12 yeasr. Client's spouse supports client and spouse of client drives client to and from scheduled medical appointments for client.  CSW spoke with client about [facility] transport benefit. CSW gave client the phone number for [employer] and encouraged client to call [employer] to schedule client transport to and from scheduled medical appointments 26 miles or closer (one way) for client. Client said she would call [employer] number to seek transport support. CSW and client completed [facility] consent form.  CSW gave client the yellow copy of completed [facility] consent form.  CSW spoke with client about advanced directives on 09/30/14.  Client does not currently have an advanced directive but CSW gave client booklet on advanced directives and offered to further discuss advanced directives with client.   Client wears glasses and thinks she will see opthamologist in near future.  CSW and client completed [facility] assessments. Client has some difficulty walking in home. She has a standard walker,a rolling walker and a cane. She said she would be interested in a [facility] nurse calling her to talk about current nursing needs of client.  Client is interested in talking with a [place] who could visit client at home of client.  CSW gave client [facility] CSW card and encouraged client to call CSW at [phone] as needed to discuss CSW needs of client.  CSW thanked client and spouse of client for home visit with client on 09/30/14.   Plan:  Client to take medications as prescribed and attend scheduled medical  appointments. Client to communicate with Dr. Moshe Cipro, as needed, to discuss medical needs of client. CSW to call client in two weeks to assess needs of client.  [name].[name] MSW, LCSW Licensed Clinical Social Worker [facility] Care Management [phone]

## 2014-09-30 NOTE — Patient Outreach (Signed)
[facility]) Care Management  09/30/2014  Falisha Osment Steveson 1929/09/06 [id number]   Request from Theadore Nan, LCSW to assign Community RN, assigned Jacqlyn Larsen, RN.  Thanks, Ronnell Freshwater. [place], [address] Assistant Phone: [phone] Fax: [phone]

## 2014-10-01 ENCOUNTER — Other Ambulatory Visit: Payer: Self-pay | Admitting: *Deleted

## 2014-10-01 NOTE — Patient Outreach (Signed)
10/01/14- Telephone call to patient for screening for nursing services, spoke with pt, HIPAA verified,  Pt states she saw Dr. Carloyn Manner today and will be having back surgery in few months.  Pt reports her last Hgb AIC was 8.8 and she would like to improve this before her surgery.  Pt states she has had some falls, uses her walker,  RN CM suggested a physical therapy home health referral may be appropriate and pt refuses and states " I would rather wait until after my surgery to have any of that".  RN CM reviewed safety precautions and importance of using walker at all times and asking for assistance as needed.  Pt reports her husband "helps with everything"  Pt has an aide from an agency (pt cannot remember the name) and pays out of pocket for this service.  RN CM offered to see pt this upcoming week and pt says " that's just too soon",  Home visit scheduled for 10/18/14.  RN CM sent In Basket to [place] with update.  Jacqlyn Larsen [facility], [place] Coordinator [phone]

## 2014-10-02 ENCOUNTER — Other Ambulatory Visit: Payer: Self-pay | Admitting: Family Medicine

## 2014-10-04 ENCOUNTER — Other Ambulatory Visit: Payer: Self-pay | Admitting: Family Medicine

## 2014-10-05 ENCOUNTER — Other Ambulatory Visit: Payer: Self-pay | Admitting: Family Medicine

## 2014-10-05 ENCOUNTER — Ambulatory Visit: Payer: Self-pay | Admitting: "Endocrinology

## 2014-10-05 ENCOUNTER — Other Ambulatory Visit: Payer: Self-pay

## 2014-10-05 MED ORDER — ROSUVASTATIN CALCIUM 40 MG PO TABS: ORAL_TABLET | ORAL | Status: DC

## 2014-10-06 ENCOUNTER — Other Ambulatory Visit: Payer: Self-pay

## 2014-10-06 ENCOUNTER — Other Ambulatory Visit: Payer: Self-pay | Admitting: Family Medicine

## 2014-10-06 MED ORDER — GLUCOSE BLOOD VI STRP: ORAL_STRIP | Status: DC

## 2014-10-13 ENCOUNTER — Ambulatory Visit: Payer: Medicare HMO | Admitting: Gastroenterology

## 2014-10-18 ENCOUNTER — Ambulatory Visit: Payer: Commercial Managed Care - HMO | Admitting: *Deleted

## 2014-10-18 ENCOUNTER — Encounter: Payer: Self-pay | Admitting: Licensed Clinical Social Worker

## 2014-10-18 ENCOUNTER — Other Ambulatory Visit: Payer: Self-pay | Admitting: *Deleted

## 2014-10-18 NOTE — Patient Outreach (Signed)
10/18/14- Telephone call to patient to remind her of today's scheduled initial home visit, no answer to telephone, left voicemail with reminder. Pt called back at 1110 am and cancelled today's appointment, pt rescheduled for 10/26/14.  Jacqlyn Larsen [facility], [place] Coordinator [phone]

## 2014-10-20 LAB — HM DIABETES EYE EXAM

## 2014-10-22 ENCOUNTER — Other Ambulatory Visit: Payer: Self-pay

## 2014-10-22 MED ORDER — OXYCODONE-ACETAMINOPHEN 10-325 MG PO TABS: ORAL_TABLET | ORAL | Status: DC

## 2014-10-26 ENCOUNTER — Encounter: Payer: Self-pay | Admitting: *Deleted

## 2014-10-26 ENCOUNTER — Other Ambulatory Visit: Payer: Self-pay | Admitting: *Deleted

## 2014-10-26 IMAGING — CR DG KNEE COMPLETE 4+V*R*
4 series · 4 of 4 positions shown · non-contrast
Comparison: 06/04/2011

CLINICAL DATA: Fell.  Landed on the knee.

RIGHT KNEE - COMPLETE 4+ VIEW

[x knee ap right]
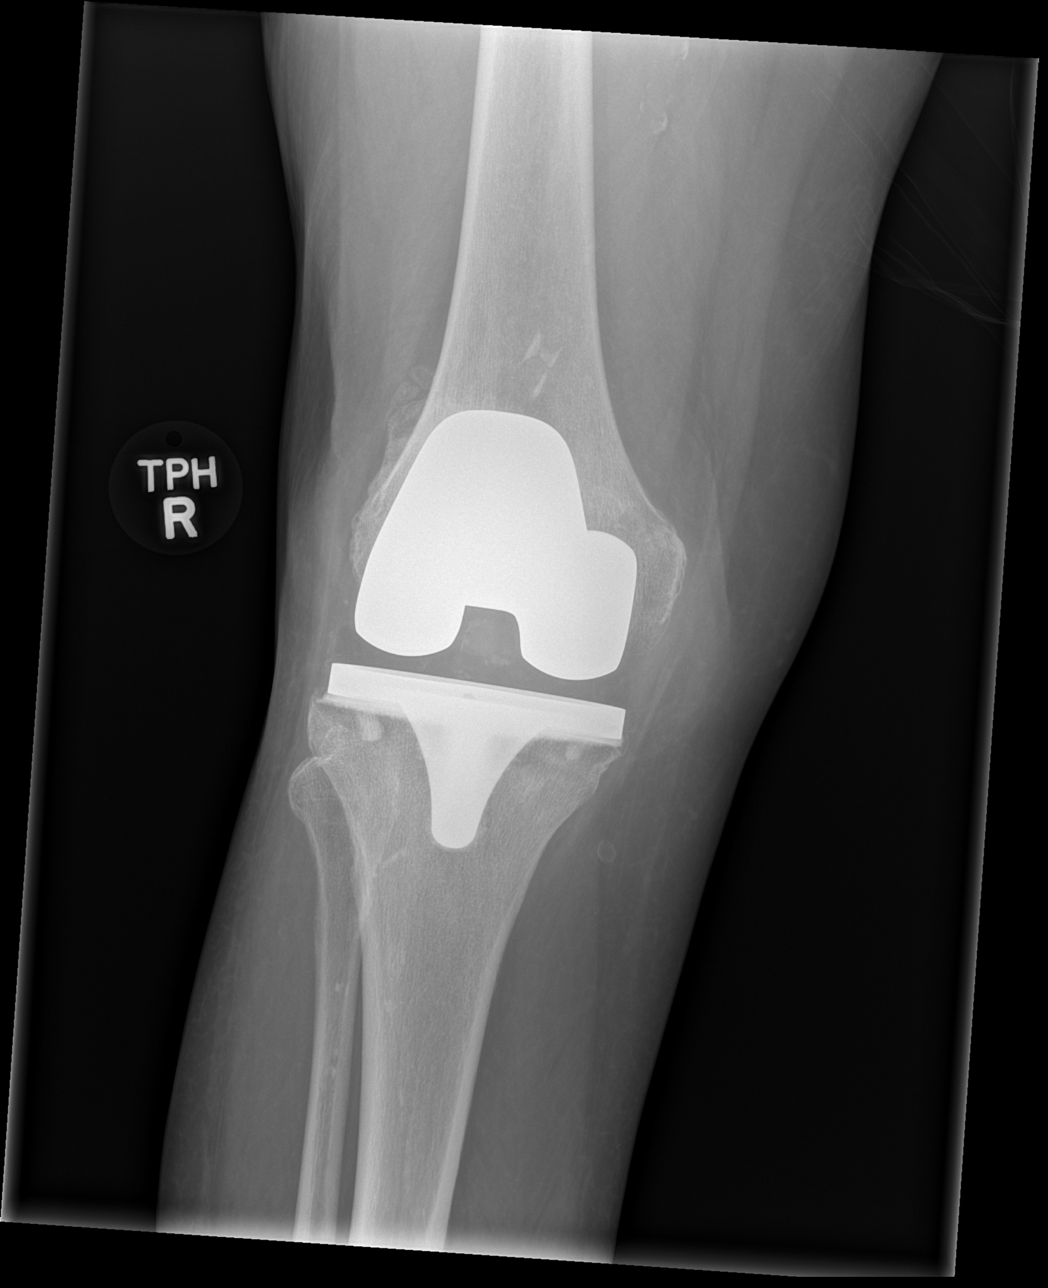

[x knee obl right (1 of 2)]
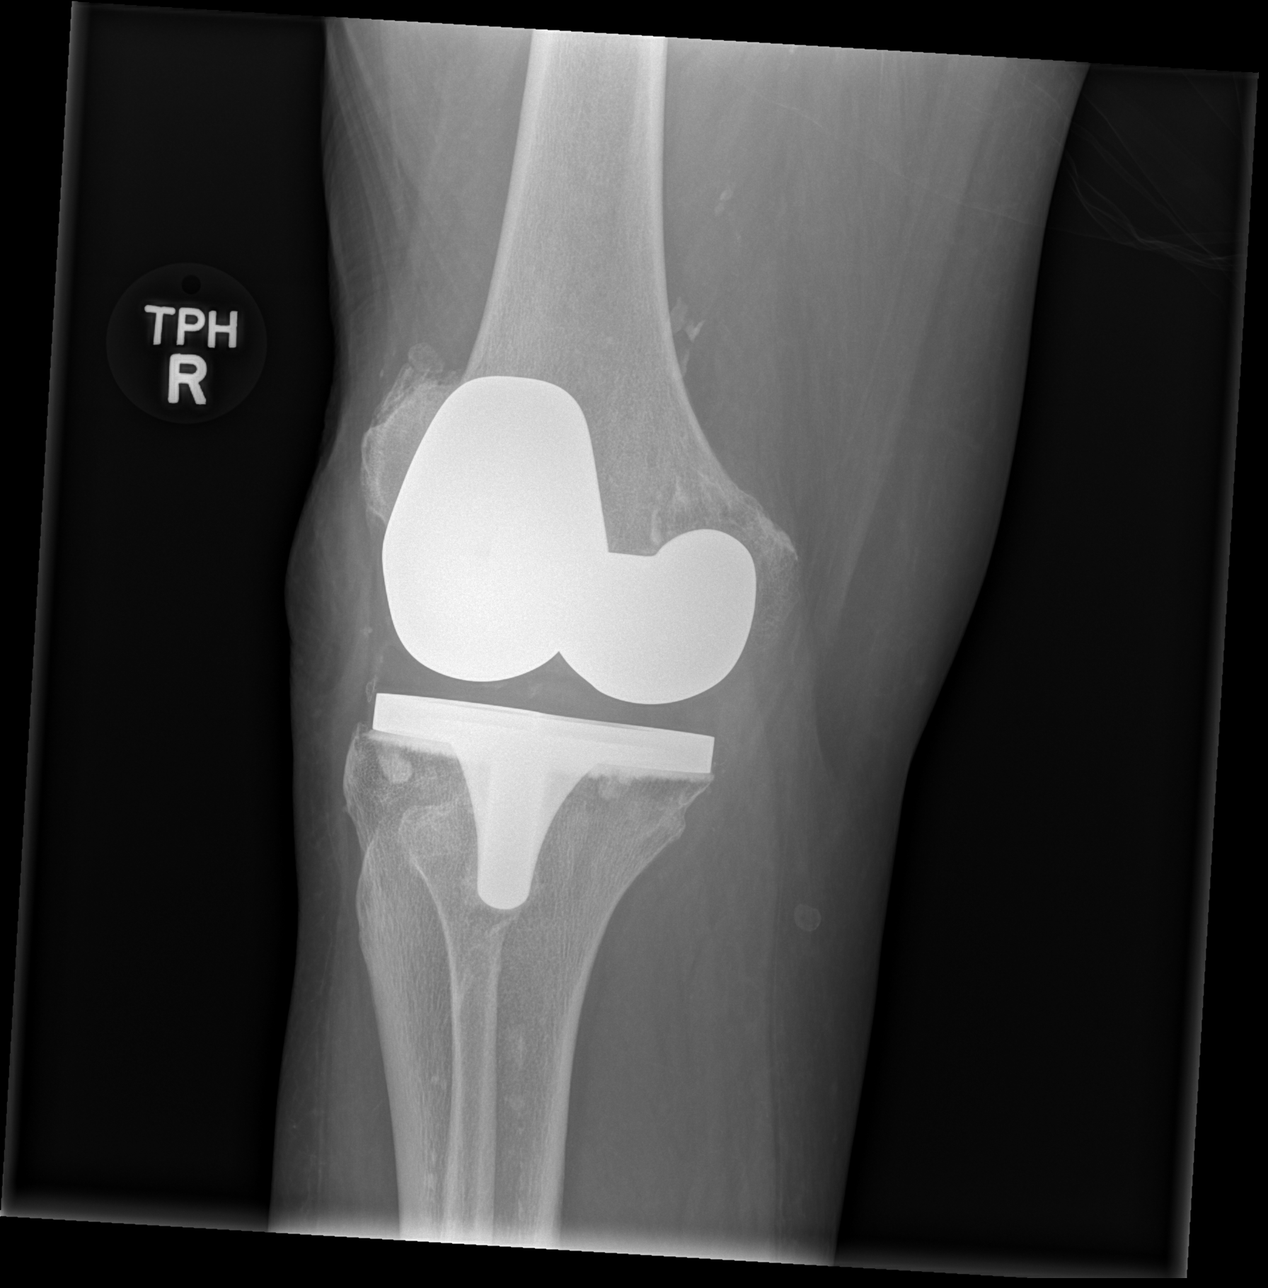

[x knee obl right (2 of 2)]
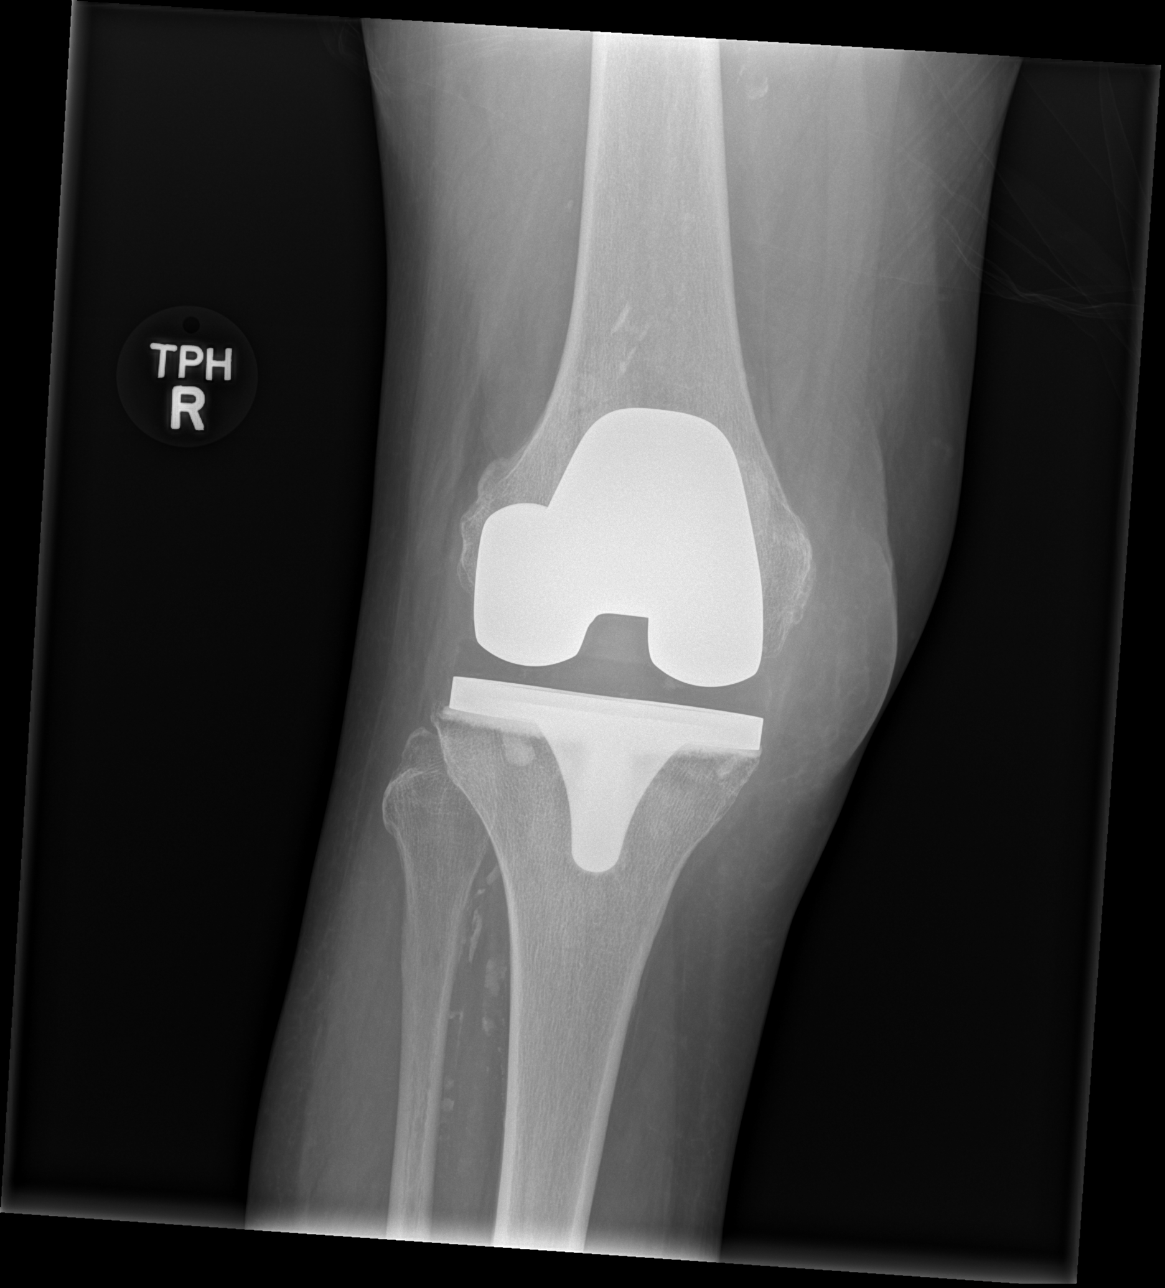

[x knee lat right]
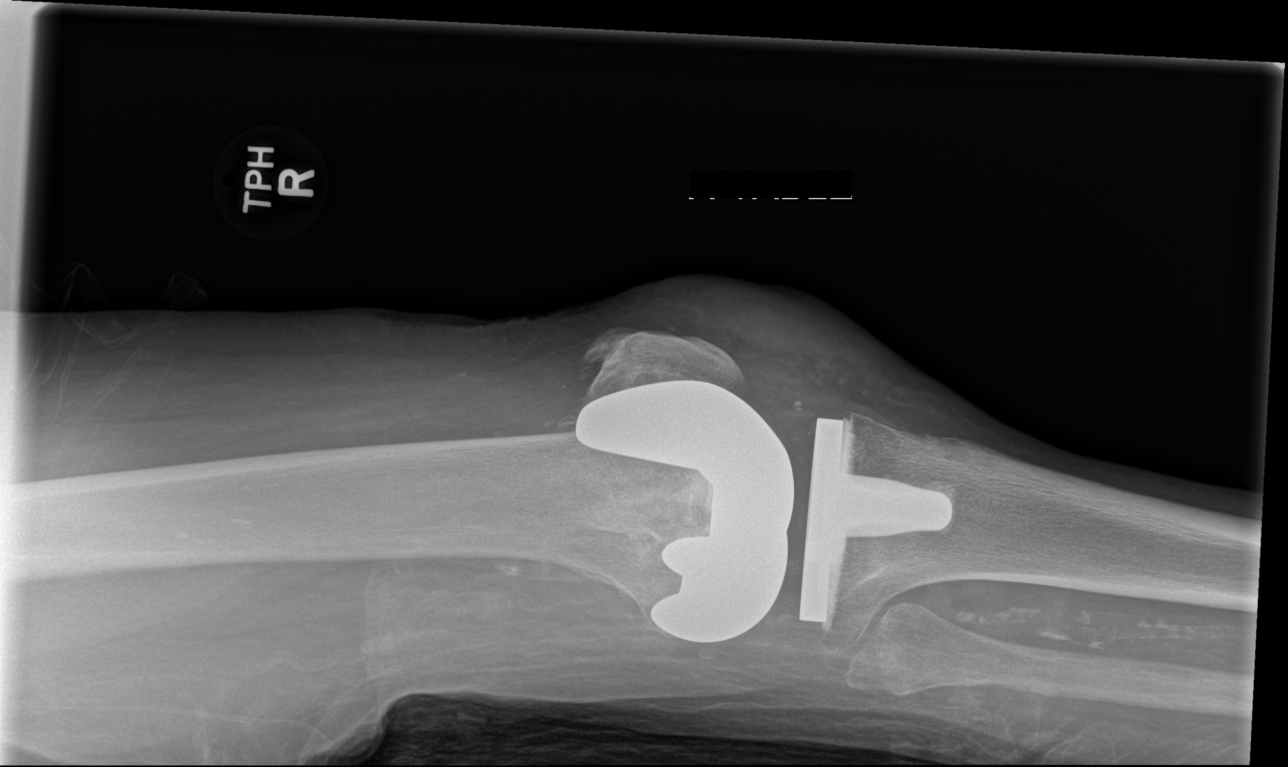

[4 of 4 positions shown; findings below may reference images not displayed]

FINDINGS: The patient has had knee arthroplasty.  There is no
evidence for acute fracture or subluxation.  Small joint effusion
is present.  There is significant soft tissue thickening of the
anterior knee.  Dense atherosclerotic calcification is identified
in the distal femoral and popliteal artery.
IMPRESSION: 1.  Significant soft tissue swelling and small joint effusion.
2.  No evidence for acute fracture.

## 2014-10-26 NOTE — Patient Outreach (Signed)
[facility]) Care Management   10/26/2014  [name] [name] [birth year] [id number]  [name] [name] is an 81 y.o. female  Subjective: Initial home visit with pt, HIPAA verfied, pt and husband present, pt reports she would like to improve Hgb AIC and especially if decides to have back surgery. Pt reports she uses cane and walker in the home and is unsteady, states " I don't want physical therapy, I'm not able to do all of that".  Objective:   Filed Vitals:   10/26/14 1206 10/26/14 1209  BP: 144/76 144/76  Pulse: 62   Resp: 16   Height: 1.575 m (5\' 2" )   Weight: 163 lb (73.936 kg)   SpO2: 96%   CBG fasting today 108 7 day average 119 14 day average 113 30 day average 120 ROS  Physical Exam  Constitutional: She is oriented to person, place, and time. She appears well-developed and well-nourished.  HENT:  Head: Normocephalic and atraumatic.  Neck: Normal range of motion. Neck supple.  Cardiovascular: Normal rate and regular rhythm.   Respiratory: Effort normal and breath sounds normal.  GI: Soft. Bowel sounds are normal.  Musculoskeletal: She exhibits no edema.  Pt has decreased range of motion, stilffness  Neurological: She is alert and oriented to person, place, and time.  Forgetful at times  Skin: Skin is warm.  Psychiatric: She has a normal mood and affect. Her behavior is normal. Judgment and thought content normal.    Current Medications:   Current Outpatient Prescriptions  Medication Sig Dispense Refill  . aspirin 81 MG tablet Take 81 mg by mouth daily.      . benazepril-hydrochlorthiazide (LOTENSIN HCT) 20-12.5 MG per tablet Take 2 tablets by mouth daily. 180 tablet 1  . calcium carbonate (OSCAL) 1500 (600 CA) MG TABS tablet Take 2 tablets by mouth 1 day or 1 dose.    . citalopram (CELEXA) 40 MG tablet TAKE 1 TABLET BY MOUTH EVERY DAY 30 tablet 5  . diltiazem (DILT-XR) 240 MG 24 hr capsule TAKE 1 CAPSULE BY MOUTH DAILY. 90 capsule 1  . fenofibrate  (TRICOR) 145 MG tablet Take 1 tablet (145 mg total) by mouth at bedtime. 90 tablet 1  . fish oil-omega-3 fatty acids 1000 MG capsule Take 1 g by mouth daily. 2 caps bid     . fluticasone (FLONASE) 50 MCG/ACT nasal spray PLACE 2 SPRAYS INTO BOTH NOSTRILS DAILY. 16 g 6  . furosemide (LASIX) 40 MG tablet TAKE 1 TABLET BY MOUTH EVERY DAY AS DIRECTED 30 tablet 11  . gabapentin (NEURONTIN) 100 MG capsule TAKE 2 CAPSULES BY MOUTH THREE TIMES DAILY (DOSE INCREASE) 180 capsule 3  . glucose blood (ACCU-CHEK AVIVA PLUS) test strip USE ONE STRIP THREE TIMES DAILY 100 each 0  . Insulin Detemir (LEVEMIR FLEXTOUCH) 100 UNIT/ML Pen Inject 30 Units into the skin daily at 10 pm. 15 mL 0  . L-Methylfolate-Algae-B12-B6 (METANX) 3-90.314-2-35 MG CAPS TAKE 1 CAPSULE BY MOUTH EVERY DAY 30 capsule 2  . oxyCODONE-acetaminophen (PERCOCET) 10-325 MG tablet One tablet four times daily for severe uncontrolled pain 120 tablet 0  . pantoprazole (PROTONIX) 40 MG tablet Take 40 mg by mouth daily.    . ranitidine (ZANTAC) 150 MG capsule Take 150 mg by mouth every evening.    . rosuvastatin (CRESTOR) 40 MG tablet TAKE 1 TABLET BY MOUTH EVERY DAILY 30 tablet 5  . Vitamin D, Ergocalciferol, (DRISDOL) 50000 UNITS CAPS capsule Take 1 capsule (50,000 Units total) by mouth every  7 (seven) days. 4 capsule 11   No current facility-administered medications for this visit.    Functional Status:   In your present state of health, do you have any difficulty performing the following activities: 10/26/2014 09/30/2014  Hearing? N N  Vision? N Y  Difficulty concentrating or making decisions? N Y  Walking or climbing stairs? Y Y  Dressing or bathing? N N  Doing errands, shopping? [name] [name]  Preparing Food and eating ? N N  Using the Toilet? N N  In the past six months, have you accidently leaked urine? Y N  Do you have problems with loss of bowel control? N N  Managing your Medications? N N  Managing your Finances? N N  Housekeeping or  managing your Housekeeping? [name] [name]    Fall/Depression Screening:    PHQ 2/9 Scores 10/26/2014 09/30/2014 08/09/2014 12/10/2012 08/26/2012  PHQ - 2 Score 1 2 0 1 0  PHQ- 9 Score - 6 - - -   Fall Risk  10/26/2014 09/30/2014 08/09/2014 05/04/2014 12/10/2012  Falls in the past year? Yes Yes Yes No Yes  Number falls in past yr: 2 or more 2 or more 2 or more - 2 or more  Injury with Fall? No No No - -  Risk Factor Category  - High Fall Risk High Fall Risk - -  Risk for fall due to : History of fall(s);Medication side effect;Impaired balance/gait History of fall(s);Impaired balance/gait - - History of fall(s);Impaired mobility  Follow up Education provided;Falls prevention discussed;Falls evaluation completed Falls prevention discussed - - -    Assessment:  Pt is high risk for falls but refuses physical therapy, RN CM reinforced safety precautions and reminded to use walker and cane.  Pt has already applied for low income subsidies at [place] with [employer]yse [employer] and is awaiting decision. Pt is able to purchase medications but feels she could use the extra help if she qualifies, [facility] CSW is involved with patient. Pt refuses prefilled medication box and also refuses Easy Meds Program from [facility].  RN CM observed all medication bottles and reviewed with pt and husband, gave pt [facility] calendar and 24 hour nurse line magnet.  RN CM faxed initial home visit and barrier letter to primary MD Dr. Moshe Cipro.  THN CM Care Plan Problem One        Most Recent Value   Care Plan Problem One  knowledge deficit related to diabetes managment   Role Documenting the Problem One  Care Management Coordinator   Care Plan for Problem One  Active   THN Long Term Goal (31-90 days)  Hgb AIC will be 7 or less within 90 days.   THN Long Term Goal Start Date  10/26/14   Interventions for Problem One Long Term Goal  RN CM reviewed correlation of daily CBG readings and Hgb AIC, encouraged pt to keep good glucose control  and importance for overall health.   THN CM Short Term Goal #1 (0-30 days)  Pt will verbalize how many carbohydrates she should consume at each meal within 30 days   THN CM Short Term Goal #1 Start Date  10/26/14   Interventions for Short Term Goal #1  RN CM reviewed EMMI handouts related to diabetes, gave picture of plate method and Basic Carb Counting, diabetic booklet, also urged pt to review all handouts before next visit, RN CM reviewed examples of carbohydrates and how many pt is allowed at each meal.  Plan: follow up with home visit on 11/16/14 Continue diabetes teaching, assess CBG log, glucometer  Jacqlyn Larsen [facility], [place] [phone]

## 2014-10-27 ENCOUNTER — Ambulatory Visit (INDEPENDENT_AMBULATORY_CARE_PROVIDER_SITE_OTHER): Payer: Commercial Managed Care - HMO | Admitting: Gastroenterology

## 2014-10-27 ENCOUNTER — Encounter: Payer: Self-pay | Admitting: Gastroenterology

## 2014-10-27 VITALS — BP 109/56 | HR 51 | Temp 98.1°F | Ht 62.0 in | Wt 169.6 lb

## 2014-10-27 DIAGNOSIS — K5900 Constipation, unspecified: Secondary | ICD-10-CM

## 2014-10-27 NOTE — Patient Instructions (Signed)
Start taking Amitiza 1 gelcap WITH FOOD twice a day to avoid nausea.   You may stop the laxatives and stool softeners while on Amitiza.   Let me know how this goes. I will send a prescription if needed.   I will see you in 3 months!

## 2014-10-27 NOTE — Progress Notes (Signed)
Referring Provider: Fayrene Helper, MD Primary Care Physician:  Tula Nakayama, MD  Primary GI: Dr. Gala Romney   Chief Complaint  Patient presents with  . Weight Loss    change in bowel habits    HPI:    Tashina Credit Grossi is an 81 y.o. female presenting today with a history of dysphagia and early satiety. EGD recently completed (Sept 2015) with Schatzki's ring s/p dilation. CT abd/pelvis completed and unremarkable Dec 2015.   Weight is stable. Not sure if she will be having back surgery or not. Significant back pain this week. On hydrocodone. Intermittent constipation. Will take stool softeners with laxatives. Would like to trial prescriptive agent now. Previously, she has declined this.   Past Medical History  Diagnosis Date  . Atrial flutter ([place]) febuary 2008       atrial flutter... DC Cardioversion...successful... holding sinus as of january 2011 coumadin therapy...discontinued...severe hematoma secondary to fall  . Hypertension   . Hyperlipidemia   . Hypercholesterolemia   . Mitral regurgitation 2010    mild ...echo...2010  . Obesity   . Sleep apnea   . Fatigue   . Depression with anxiety   . Visual loss     unspecified  . Basal cell carcinoma   . Hematochezia   . Hematoma     ...buttocks secondary to fall november 2009 with coumadin  coumadin stopped  . Diabetes mellitus     type 2  . Back pain     with radiculopathy  . Arthritis   . Osteoarthritis   . Acute cystitis   . GERD (gastroesophageal reflux disease)   . Diverticulosis of colon   . Constipation   . IBS (irritable bowel syndrome)   . Abdominal bloating   . Nausea   . Erosive esophagitis   . Aortic valve sclerosis     Ef 60 %...echo...july 2010   . Chronic granulomatous disease ([place])     right chest ... ct chest... novmber 2009  . Normal nuclear stress test     september ,2007...no ischemia  . Anticoagulant long-term use     Coumadin  stopped...severe hematoma  from fall  . Edema     Painful,  April, 2012  . Ejection fraction     EF 60-65%, echo, April 13, 2010, normal RV function, mild mitral regurgitation  . Carotid artery disease ([place])     Doppler, [facility], February 21, 2012, less than 50% bilateral carotid stenoses    Past Surgical History  Procedure Laterality Date  . Bilateral bunion removel  [id number]  . Replacement total knee bilateral      2000,2001  . Tonsillectomy  1957  . Appendectomy  1948  . Dilation and curettage of uterus  1952 and 2001  . Abdominal hysterectomy  2001  . Cataract extraction  2001    right   . Other surgical history    . Cataract extraction      left 2005  . Basal cell carcinoma excision  09/2009    right forarm and back of neck and left side   . Colonoscopy   07/17/2006    Dr. Rourk:anal canal hemorrhoids, diminutive rectal polyp status post cold biopsy.  Otherwise, normal rectum. Left sided and  transverse diverticula.  Diminutive polyp in the ascending colon, cold bx. hyperplastic polyps.   . Esophagogastroduodenoscopy   07/17/2006    Dr. Rourk:single 2 cm distal esophageal erosion consistent with erosive reflux esophagitis, otherwise normal  . Esophagogastroduodenoscopy N/A 09/10/2013  Dr. Rourk:Schatzki's ring - status post [facility] dilation. Mild erosive reflux esophagitis. Hiatal hernia with fosamax capsule lodged as above  . Savory dilation N/A 09/10/2013    Procedure: SAVORY DILATION;  Surgeon: Daneil Dolin, MD;  [facility];  Service: Endoscopy;  Laterality: N/A;  Venia Minks dilation N/A 09/10/2013    Procedure: Venia Minks DILATION;  Surgeon: Daneil Dolin, MD;  Location: [facility];  Service: Endoscopy;  Laterality: N/A;    Current Outpatient Prescriptions  Medication Sig Dispense Refill  . aspirin 81 MG tablet Take 81 mg by mouth daily.      . benazepril-hydrochlorthiazide (LOTENSIN HCT) 20-12.5 MG per tablet Take 2 tablets by mouth daily. 180 tablet 1  . calcium carbonate (OSCAL) 1500 (600 CA) MG TABS  tablet Take 2 tablets by mouth 1 day or 1 dose.    . Cholecalciferol (VITAMIN D PO) Take 1,200 mg by mouth.    . citalopram (CELEXA) 40 MG tablet TAKE 1 TABLET BY MOUTH EVERY DAY 30 tablet 5  . diltiazem (DILT-XR) 240 MG 24 hr capsule TAKE 1 CAPSULE BY MOUTH DAILY. 90 capsule 1  . fenofibrate (TRICOR) 145 MG tablet Take 1 tablet (145 mg total) by mouth at bedtime. 90 tablet 1  . fish oil-omega-3 fatty acids 1000 MG capsule Take 1 g by mouth daily. 2 caps bid     . fluticasone (FLONASE) 50 MCG/ACT nasal spray PLACE 2 SPRAYS INTO BOTH NOSTRILS DAILY. 16 g 6  . furosemide (LASIX) 40 MG tablet TAKE 1 TABLET BY MOUTH EVERY DAY AS DIRECTED 30 tablet 11  . gabapentin (NEURONTIN) 100 MG capsule TAKE 2 CAPSULES BY MOUTH THREE TIMES DAILY (DOSE INCREASE) 180 capsule 3  . Insulin Detemir (LEVEMIR FLEXTOUCH) 100 UNIT/ML Pen Inject 30 Units into the skin daily at 10 pm. 15 mL 0  . L-Methylfolate-Algae-B12-B6 (METANX) 3-90.314-2-35 MG CAPS TAKE 1 CAPSULE BY MOUTH EVERY DAY 30 capsule 2  . oxyCODONE-acetaminophen (PERCOCET) 10-325 MG tablet One tablet four times daily for severe uncontrolled pain 120 tablet 0  . ranitidine (ZANTAC) 150 MG capsule Take 150 mg by mouth every evening.    . rosuvastatin (CRESTOR) 40 MG tablet TAKE 1 TABLET BY MOUTH EVERY DAILY 30 tablet 5  . glucose blood (ACCU-CHEK AVIVA PLUS) test strip USE ONE STRIP THREE TIMES DAILY 100 each 0  . pantoprazole (PROTONIX) 40 MG tablet Take 40 mg by mouth daily.    . Vitamin D, Ergocalciferol, (DRISDOL) 50000 UNITS CAPS capsule Take 1 capsule (50,000 Units total) by mouth every 7 (seven) days. (Patient not taking: Reported on 10/27/2014) 4 capsule 11   No current facility-administered medications for this visit.    Allergies as of 10/27/2014 - Review Complete 10/27/2014  Allergen Reaction Noted  . Lisinopril Nausea And Vomiting   . Reclast [zoledronic acid] Other (See Comments) 02/23/2014  . Alprazolam Other (See Comments)   . Losartan  Other (See Comments) 04/08/2013  . Sertraline hcl Diarrhea 05/24/2008  . Sulfonamide derivatives Rash     Family History  Problem Relation Age of Onset  . Dementia Mother   . Heart disease Father   . Diabetes    . Arthritis    . Colon cancer Neg Hx     Social History   Social History  . Marital Status: Married    Spouse Name: N/A  . Number of Children: N/A  . Years of Education: 13   Occupational History  . retired    Social History Main Topics  .  Smoking status: Never Smoker   . Smokeless tobacco: Never Used  . Alcohol Use: No  . Drug Use: No  . Sexual Activity: Not Currently   Other Topics Concern  . None   Social History Narrative    Review of Systems: Negative unless mentioned in HPI   Physical Exam: BP 109/56 mmHg  Pulse 51  Temp(Src) 98.1 F (36.7 C) (Oral)  Ht 5\' 2"  (1.575 m)  Wt 169 lb 9.6 oz (76.93 kg)  BMI 31.01 kg/m2 General:   Alert and oriented. No distress noted. Pleasant and cooperative.  Head:  Normocephalic and atraumatic. Eyes:  Conjuctiva clear without scleral icterus. Mouth:  Oral mucosa pink and moist. Good dentition. No lesions. Abdomen:  +BS, soft, non-tender and non-distended. No rebound or guarding. Query umbilical hernia Msk:  Moderate kyphosis. Shuffled gait. Arthritic changes to hands.  Extremities:  Without edema. Neurologic:  Alert and  oriented x4;  grossly normal neurologically. Psych:  Alert and cooperative. Normal mood and affect.

## 2014-10-27 NOTE — Assessment & Plan Note (Signed)
81 year old female with chronic constipation, failure of OTC agents. Will trial Amitiza 24 mcg po BID. Samples provided. Patient to call if this works well, and we will send in full prescription. Likely opioid-induced. Return in 3 months or sooner if needed. No alarm features.

## 2014-10-28 NOTE — Progress Notes (Signed)
cc'ed to pcp °

## 2014-11-02 ENCOUNTER — Telehealth: Payer: Self-pay

## 2014-11-02 MED ORDER — LUBIPROSTONE 24 MCG PO CAPS: 24.0000 ug | ORAL_CAPSULE | Freq: Two times a day (BID) | ORAL | Status: DC

## 2014-11-02 NOTE — Telephone Encounter (Signed)
Pt called and states that the Amitiza is working and that she would like a RX sent to [facility]

## 2014-11-02 NOTE — Telephone Encounter (Signed)
Completed.

## 2014-11-04 ENCOUNTER — Telehealth: Payer: Self-pay | Admitting: Internal Medicine

## 2014-11-04 NOTE — Telephone Encounter (Signed)
I just spoke with Sam at [facility] and called in rx for amitiza.

## 2014-11-04 NOTE — Telephone Encounter (Signed)
Pt called to see which pharmacy her [name] [name] was sent to and I told her [facility]. She wants prescription sent to [facility] ASAP.

## 2014-11-04 NOTE — Telephone Encounter (Signed)
Sam at the pharmacy called back- pt is in the "doughnut hole" and the copay for amitiza 93mcg is $149.00. Pt cannot afford that. We are out of amitiza 43mcg samples. Pt wants to know if there is something she can take?

## 2014-11-05 NOTE — Telephone Encounter (Signed)
Oh I hate that. Ok, let's try to get samples for her to "float" her through, and then may take Miralax daily to BID as needed.

## 2014-11-09 NOTE — Telephone Encounter (Signed)
#  2 boxes are available of the amitiza 43mcg. I have left them up front for the pt. I tried to call and let her know but only got the voicemail. LMOM with instructions.

## 2014-11-09 NOTE — Telephone Encounter (Signed)
Spoke with the pt and she is aware. She will call back as needed to see if we have samples.

## 2014-11-16 ENCOUNTER — Other Ambulatory Visit: Payer: Self-pay | Admitting: *Deleted

## 2014-11-16 ENCOUNTER — Ambulatory Visit: Payer: Commercial Managed Care - HMO | Admitting: *Deleted

## 2014-11-16 NOTE — Patient Outreach (Signed)
11/16/14- RN CM called patient on the way to her home for scheduled home visit, no answer to telephone, left voicemail reminding of today's scheduled visit, RN CM arrived at patient's home, no answer to locked door.  PLAN Will attempt to reach pt at later date.  Jacqlyn Larsen [facility], [place] Coordinator [phone]

## 2014-11-18 ENCOUNTER — Other Ambulatory Visit: Payer: Self-pay

## 2014-11-18 ENCOUNTER — Ambulatory Visit (INDEPENDENT_AMBULATORY_CARE_PROVIDER_SITE_OTHER): Payer: Commercial Managed Care - HMO | Admitting: Otolaryngology

## 2014-11-18 DIAGNOSIS — K219 Gastro-esophageal reflux disease without esophagitis: Secondary | ICD-10-CM | POA: Diagnosis not present

## 2014-11-18 DIAGNOSIS — R070 Pain in throat: Secondary | ICD-10-CM | POA: Diagnosis not present

## 2014-11-18 LAB — HEMOGLOBIN A1C: Hgb A1c MFr Bld: 9.5 % — AB (ref 4.0–6.0)

## 2014-11-18 MED ORDER — OXYCODONE-ACETAMINOPHEN 10-325 MG PO TABS: ORAL_TABLET | ORAL | Status: DC

## 2014-11-22 ENCOUNTER — Encounter: Payer: Self-pay | Admitting: *Deleted

## 2014-11-22 ENCOUNTER — Other Ambulatory Visit: Payer: Self-pay | Admitting: *Deleted

## 2014-11-22 NOTE — Patient Outreach (Signed)
11/22/14- Telephone call to patient for follow up and to reschedule home visit, spoke with pt, HIPAA verified, pt requests discharge from [facility] program (RN and CSW) stating she has no further needs and does not want a home visit or telephonic management, RN CM sent In Basket to [facility] [place] closure at pt request, RN CM faxed letter to Dr. Moshe Cipro to inform and [place] closure letter to patient's home.  Jacqlyn Larsen [facility], [place] Coordinator [phone]

## 2014-11-23 ENCOUNTER — Encounter: Payer: Self-pay | Admitting: Licensed Clinical Social Worker

## 2014-11-23 ENCOUNTER — Other Ambulatory Visit: Payer: Self-pay | Admitting: Licensed Clinical Social Worker

## 2014-11-23 NOTE — Patient Outreach (Signed)
Assessment: CSW received communication from [facility] regarding client. RN [name] [name] reported to CSW that client had requested to be discharged from all [facility] program services. Client recently informed RN [name] [name] that client did not want any home visits or any telephonic support from [place] or from [facility].  Thus, RN discharged client from [facility] on 11/22/14.  CSW is discharging [name] [name] from [facility] services on 11/23/14 per client request. RN [name] [name] has faxed physician [name] [name] closure letter to Dr. Moshe [name] regarding [name] [name] closure for client.   Plan; CSW is discharging client on 11/23/14 from [facility] per client request. CSW to inform [name] [name] that [facility] discharged client from [facility] services on 11/23/14 per client request.  [name] [name] MSW, LCSW Licensed Clinical Social Worker [facility] Care Management [phone]

## 2014-11-29 ENCOUNTER — Encounter: Payer: Self-pay | Admitting: "Endocrinology

## 2014-11-29 ENCOUNTER — Ambulatory Visit (INDEPENDENT_AMBULATORY_CARE_PROVIDER_SITE_OTHER): Payer: Commercial Managed Care - HMO | Admitting: "Endocrinology

## 2014-11-29 VITALS — BP 134/72 | HR 63 | Ht 62.0 in | Wt 166.0 lb

## 2014-11-29 DIAGNOSIS — I10 Essential (primary) hypertension: Secondary | ICD-10-CM

## 2014-11-29 DIAGNOSIS — E039 Hypothyroidism, unspecified: Secondary | ICD-10-CM | POA: Insufficient documentation

## 2014-11-29 DIAGNOSIS — E785 Hyperlipidemia, unspecified: Secondary | ICD-10-CM | POA: Diagnosis not present

## 2014-11-29 DIAGNOSIS — E1121 Type 2 diabetes mellitus with diabetic nephropathy: Secondary | ICD-10-CM

## 2014-11-29 MED ORDER — INSULIN NPH ISOPHANE & REGULAR (70-30) 100 UNIT/ML ~~LOC~~ SUSP: 20.0000 [IU] | Freq: Two times a day (BID) | SUBCUTANEOUS | Status: DC

## 2014-11-29 MED ORDER — LEVOTHYROXINE SODIUM 25 MCG PO TABS: 50.0000 ug | ORAL_TABLET | Freq: Every day | ORAL | Status: DC

## 2014-11-29 NOTE — Progress Notes (Signed)
Subjective:    Patient ID: [name] [name], female    DOB: Feb 10, 1929, PCP Tula Nakayama, MD   Past Medical History  Diagnosis Date  . Atrial flutter ([place]) febuary 2008       atrial flutter... DC Cardioversion...successful... holding sinus as of january 2011 coumadin therapy...discontinued...severe hematoma secondary to fall  . Hypertension   . Hyperlipidemia   . Hypercholesterolemia   . Mitral regurgitation 2010    mild ...echo...2010  . Obesity   . Sleep apnea   . Fatigue   . Depression with anxiety   . Visual loss     unspecified  . Basal cell carcinoma   . Hematochezia   . Hematoma     ...buttocks secondary to fall november 2009 with coumadin  coumadin stopped  . Diabetes mellitus     type 2  . Back pain     with radiculopathy  . Arthritis   . Osteoarthritis   . Acute cystitis   . GERD (gastroesophageal reflux disease)   . Diverticulosis of colon   . Constipation   . IBS (irritable bowel syndrome)   . Abdominal bloating   . Nausea   . Erosive esophagitis   . Aortic valve sclerosis     Ef 60 %...echo...july 2010   . Chronic granulomatous disease ([place])     right chest ... ct chest... novmber 2009  . Normal nuclear stress test     september ,2007...no ischemia  . Anticoagulant long-term use     Coumadin  stopped...severe hematoma  from fall  . Edema     Painful, April, 2012  . Ejection fraction     EF 60-65%, echo, April 13, 2010, normal RV function, mild mitral regurgitation  . Carotid artery disease ([place])     Doppler, [facility], February 21, 2012, less than 50% bilateral carotid stenoses   Past Surgical History  Procedure Laterality Date  . Bilateral bunion removel  [id number]  . Replacement total knee bilateral      2000,2001  . Tonsillectomy  1957  . Appendectomy  1948  . Dilation and curettage of uterus  1952 and 2001  . Abdominal hysterectomy  2001  . Cataract extraction  2001    right   . Other surgical history    . Cataract  extraction      left 2005  . Basal cell carcinoma excision  09/2009    right forarm and back of neck and left side   . Colonoscopy   07/17/2006    Dr. Rourk:anal canal hemorrhoids, diminutive rectal polyp status post cold biopsy.  Otherwise, normal rectum. Left sided and  transverse diverticula.  Diminutive polyp in the ascending colon, cold bx. hyperplastic polyps.   . Esophagogastroduodenoscopy   07/17/2006    Dr. Rourk:single 2 cm distal esophageal erosion consistent with erosive reflux esophagitis, otherwise normal  . Esophagogastroduodenoscopy N/A 09/10/2013    Dr. Rourk:Schatzki's ring - status post Venia Minks dilation. Mild erosive reflux esophagitis. Hiatal hernia with fosamax capsule lodged as above  . Savory dilation N/A 09/10/2013    Procedure: SAVORY DILATION;  Surgeon: Daneil Dolin, MD;  Location: [facility];  Service: Endoscopy;  Laterality: N/A;  Venia Minks dilation N/A 09/10/2013    Procedure: Venia Minks DILATION;  Surgeon: Daneil Dolin, MD;  Location: [facility];  Service: Endoscopy;  Laterality: N/A;   Social History   Social History  . Marital Status: Married    Spouse Name: N/A  . Number of Children:  N/A  . Years of Education: 62   Occupational History  . retired    Social History Main Topics  . Smoking status: Never Smoker   . Smokeless tobacco: Never Used  . Alcohol Use: No  . Drug Use: No  . Sexual Activity: Not Currently   Other Topics Concern  . None   Social History Narrative   Outpatient Encounter Prescriptions as of 11/29/2014  Medication Sig  . aspirin 81 MG tablet Take 81 mg by mouth daily.    . benazepril-hydrochlorthiazide (LOTENSIN HCT) 20-12.5 MG per tablet Take 2 tablets by mouth daily.  . calcium carbonate (OSCAL) 1500 (600 CA) MG TABS tablet Take 2 tablets by mouth 1 day or 1 dose.  . Cholecalciferol (VITAMIN D PO) Take 1,200 mg by mouth.  . citalopram (CELEXA) 40 MG tablet TAKE 1 TABLET BY MOUTH EVERY DAY  . diltiazem (DILT-XR) 240 MG  24 hr capsule TAKE 1 CAPSULE BY MOUTH DAILY.  . fenofibrate (TRICOR) 145 MG tablet Take 1 tablet (145 mg total) by mouth at bedtime.  . fish oil-omega-3 fatty acids 1000 MG capsule Take 1 g by mouth daily. 2 caps bid   . fluticasone (FLONASE) 50 MCG/ACT nasal spray PLACE 2 SPRAYS INTO BOTH NOSTRILS DAILY.  . furosemide (LASIX) 40 MG tablet TAKE 1 TABLET BY MOUTH EVERY DAY AS DIRECTED  . gabapentin (NEURONTIN) 100 MG capsule TAKE 2 CAPSULES BY MOUTH THREE TIMES DAILY (DOSE INCREASE)  . glucose blood (ACCU-CHEK AVIVA PLUS) test strip USE ONE STRIP THREE TIMES DAILY  . L-Methylfolate-Algae-B12-B6 (METANX) 3-90.314-2-35 MG CAPS TAKE 1 CAPSULE BY MOUTH EVERY DAY  . ranitidine (ZANTAC) 150 MG capsule Take 150 mg by mouth every evening.  . rosuvastatin (CRESTOR) 40 MG tablet TAKE 1 TABLET BY MOUTH EVERY DAILY  . [DISCONTINUED] Insulin Detemir (LEVEMIR FLEXTOUCH) 100 UNIT/ML Pen Inject 30 Units into the skin daily at 10 pm.  . insulin NPH-regular Human (NOVOLIN 70/30) (70-30) 100 UNIT/ML injection Inject 20 Units into the skin 2 (two) times daily with a meal.  . levothyroxine (SYNTHROID, LEVOTHROID) 25 MCG tablet Take 2 tablets (50 mcg total) by mouth daily.  Marland Kitchen lubiprostone (AMITIZA) 24 MCG capsule Take 1 capsule (24 mcg total) by mouth 2 (two) times daily with a meal.  . oxyCODONE-acetaminophen (PERCOCET) 10-325 MG tablet One tablet four times daily for severe uncontrolled pain  . pantoprazole (PROTONIX) 40 MG tablet Take 40 mg by mouth daily.  . Vitamin D, Ergocalciferol, (DRISDOL) 50000 UNITS CAPS capsule Take 1 capsule (50,000 Units total) by mouth every 7 (seven) days. (Patient not taking: Reported on 10/27/2014)   No facility-administered encounter medications on file as of 11/29/2014.   ALLERGIES: Allergies  Allergen Reactions  . Lisinopril Nausea And Vomiting  . Reclast [Zoledronic Acid] Other (See Comments)    Jaw pain, and possible deterioration in kidney function  . Alprazolam Other  (See Comments)    hyper  . Losartan Other (See Comments)    Raw throat  . Sertraline Hcl Diarrhea  . Sulfonamide Derivatives Rash   VACCINATION STATUS: Immunization History  Administered Date(s) Administered  . H1N1 11/04/2007  . Influenza Split 10/12/2010, 09/28/2011  . Influenza Whole 10/25/2005, 11/01/2008, 10/04/2009  . Influenza,inj,Quad PF,36+ Mos 11/10/2012, 10/29/2013, 09/28/2014  . Pneumococcal Conjugate-13 05/04/2014  . Pneumococcal Polysaccharide-23 05/27/2003  . Td 10/04/2009  . Zoster 03/25/2006    Diabetes She presents for her follow-up diabetic visit. She has type 2 diabetes mellitus. Onset time: She was diagnosed at approximate age of  67 years. Her disease course has been worsening. There are no hypoglycemic associated symptoms. Pertinent negatives for hypoglycemia include no confusion, headaches, pallor or seizures. Associated symptoms include polydipsia and polyuria. Pertinent negatives for diabetes include no chest pain and no polyphagia. There are no hypoglycemic complications. Symptoms are improving. Diabetic complications include nephropathy. Risk factors for coronary artery disease include diabetes mellitus, dyslipidemia, hypertension and sedentary lifestyle. When asked about current treatments, none were reported. She is compliant with treatment some of the time. She is following a generally unhealthy diet. She has not had a previous visit with a dietitian. She never participates in exercise. Her overall blood glucose range is >200 mg/dl. An ACE inhibitor/angiotensin II receptor blocker is being taken. Eye exam is current.  Thyroid Problem Presents for initial visit. Onset time: she is being diagnosed with primary hypothyroidism today. Patient reports no cold intolerance, diarrhea, heat intolerance or palpitations. Past treatments include nothing. The following procedures have not been performed: radioiodine uptake scan, thyroid FNA, thyroid ultrasound and  thyroidectomy. Her past medical history is significant for hyperlipidemia.  Hyperlipidemia This is a chronic problem. The current episode started more than 1 year ago. Pertinent negatives include no chest pain, myalgias or shortness of breath. Current antihyperlipidemic treatment includes statins.  Hypertension This is a chronic problem. The current episode started more than 1 year ago. The problem is controlled. Pertinent negatives include no chest pain, headaches, palpitations or shortness of breath. Past treatments include ACE inhibitors. Hypertensive end-organ damage includes a thyroid problem.     Review of Systems  Constitutional: Negative for unexpected weight change.  HENT: Negative for trouble swallowing and voice change.   Eyes: Negative for visual disturbance.  Respiratory: Negative for cough, shortness of breath and wheezing.   Cardiovascular: Negative for chest pain, palpitations and leg swelling.  Gastrointestinal: Negative for nausea, vomiting and diarrhea.  Endocrine: Positive for polydipsia and polyuria. Negative for cold intolerance, heat intolerance and polyphagia.  Musculoskeletal: Positive for back pain and arthralgias. Negative for myalgias.  Skin: Negative for color change, pallor, rash and wound.  Neurological: Negative for seizures and headaches.  Psychiatric/Behavioral: Negative for suicidal ideas and confusion.    Objective:    BP 134/72 mmHg  Pulse 63  Ht 5\' 2"  (1.575 m)  Wt 166 lb (75.297 kg)  BMI 30.35 kg/m2  SpO2 100%  Wt Readings from Last 3 Encounters:  11/29/14 166 lb (75.297 kg)  10/27/14 169 lb 9.6 oz (76.93 kg)  10/26/14 163 lb (73.936 kg)    Physical Exam  Constitutional: She is oriented to person, place, and time. She appears well-developed.  Walks with her walker.  HENT:  Head: Normocephalic and atraumatic.  Eyes: EOM are normal.  Neck: Normal range of motion. Neck supple. No tracheal deviation present. No thyromegaly present.   Cardiovascular: Normal rate and regular rhythm.   Pulmonary/Chest: Effort normal and breath sounds normal.  Abdominal: Soft. Bowel sounds are normal. There is no tenderness. There is no guarding.  Musculoskeletal: She exhibits no edema.  Walks with her walker.  Neurological: She is alert and oriented to person, place, and time. She has normal reflexes. No cranial nerve deficit. Coordination normal.  Skin: Skin is warm and dry. No rash noted. No erythema. No pallor.  Psychiatric: She has a normal mood and affect. Judgment normal.    Results for orders placed or performed in visit on 11/29/14  Hemoglobin A1c  Result Value Ref Range   Hgb A1c MFr Bld 9.5 (A) 4.0 -  6.0 %   Complete Blood Count (Most recent): Lab Results  Component Value Date   WBC 6.0 07/08/2014   HGB 11.3* 07/08/2014   HCT 34.2* 07/08/2014   MCV 93.2 07/08/2014   PLT 251 07/08/2014   Chemistry (most recent): Lab Results  Component Value Date   NA 140 08/14/2014   K 4.5 08/14/2014   CL 102 08/14/2014   CO2 30 08/14/2014   BUN 30* 08/14/2014   CREATININE 1.35* 08/14/2014   Diabetic Labs (most recent): Lab Results  Component Value Date   HGBA1C 9.5* 11/18/2014   HGBA1C 8.8* 02/22/2014   HGBA1C 8.5* 01/29/2014   Lipid profile (most recent): Lab Results  Component Value Date   TRIG 143 08/14/2014   CHOL 158 08/14/2014     Assessment & Plan:   1. Diabetes mellitus with nephropathy ([place])  - Her diabetes is complicated by CKD, and she  remains at a high risk for more acute and chronic complications of diabetes which include CAD, CVA, CKD, retinopathy, and neuropathy. These are all discussed in detail with the patient.  Patient came with uncontrolled glucose profile, and  recent A1c elevated to  9.5 %.  She ran out of her insulin a 'few weeks ago'.  Recent labs reviewed.   - I have re-counseled the patient on diet management   by adopting a carbohydrate restricted / protein rich  Diet.  - Suggestion  is made for patient to avoid simple carbohydrates   from their diet including Cakes , Desserts, Ice Cream,  Soda (  diet and regular) , Sweet Tea , Candies,  Chips, Cookies, Artificial Sweeteners,   and "Sugar-free" Products .  This will help patient to have stable blood glucose profile and potentially avoid unintended  Weight gain.  - Patient is advised to stick to a routine mealtimes to eat 3 meals  a day and avoid unnecessary snacks ( to snack only to correct hypoglycemia).  - I have approached patient with the following individualized plan to manage diabetes and patient agrees.  - Given the long duration of her diabetes, she will need insulin therapy, however, she does not afford insulin analogs. - She says she can use insulin on MDI, I will initiate Novolin 70/30 20 units AC BRKFST and supper, associated with strict monitoring of glucose  AC and HS. - Patient is warned not to take insulin without proper monitoring per orders. -Adjustment parameters are given for hypo and hyperglycemia in writing. -Patient is encouraged to call clinic for blood glucose levels less than 70 or above 300 mg /dl.  -Patient is not a candidate for MTF, SGLT2 i  due to CKD.  - Patient specific target  for A1c; LDL, HDL, Triglycerides, and  Waist Circumference were discussed in detail.  2) BP/HTN: controlled. Continue current medications including ACEI/ARB. 3) Lipids/HPL:  continue statins. 4)  Weight/Diet: CDE consult in progress, exercise, and carbohydrates information provided.  5) Chronic Care/Health Maintenance:  -Patient is on ACEI/ARB and Statin medications and encouraged to continue to follow up with Ophthalmology, Podiatrist at least yearly or according to recommendations, and advised to  stay away from smoking. I have recommended yearly flu vaccine and pneumonia vaccination at least every 5 years; moderate intensity exercise for up to 150 minutes weekly; and  sleep for at least 7 hours a day.  - 25  minutes of time was spent on the care of this patient , 50% of which was applied for counseling on diabetes complications and their  preventions.  - I advised patient to maintain close follow up with Tula Nakayama, MD for primary care needs.  Patient is asked to bring meter and  blood glucose logs during their next visit.   Follow up plan: -Return in about 3 months (around 03/01/2015) for diabetes, high blood pressure, high cholesterol, underactive thyroid, follow up with pre-visit labs, meter, and logs.  Glade Lloyd, MD Phone: [phone]  Fax: [phone]   11/29/2014, 8:35 PM

## 2014-11-29 NOTE — Patient Instructions (Signed)

## 2014-11-30 NOTE — Telephone Encounter (Signed)
Pt called to see if we had any more amitiza 36mcg. I informed pt that we do have samples and #5 boxes are at the front for her to pick up.

## 2014-12-13 ENCOUNTER — Other Ambulatory Visit: Payer: Self-pay | Admitting: Family Medicine

## 2014-12-14 ENCOUNTER — Other Ambulatory Visit: Payer: Self-pay | Admitting: Family Medicine

## 2014-12-15 ENCOUNTER — Other Ambulatory Visit: Payer: Self-pay

## 2014-12-15 ENCOUNTER — Telehealth: Payer: Self-pay

## 2014-12-15 MED ORDER — GLUCOSE BLOOD VI STRP: ORAL_STRIP | Status: DC

## 2014-12-15 NOTE — Telephone Encounter (Signed)
Pt states they have had high BG readings.   Date Before breakfast Before lunch Before supper Bedtime  12/11 112  188   12/12 131  266   12/13 225  281   12/14 189       Pt taking:  Pt is taking Novolin 70-30 30 units BID

## 2014-12-15 NOTE — Telephone Encounter (Signed)
She  can increase the morning Novolin with breakfast to 40 units and keep the suppertime Novolin 30 units.

## 2014-12-15 NOTE — Telephone Encounter (Signed)
Pt notified and agrees. 

## 2014-12-31 ENCOUNTER — Other Ambulatory Visit: Payer: Self-pay

## 2014-12-31 MED ORDER — OXYCODONE-ACETAMINOPHEN 10-325 MG PO TABS: ORAL_TABLET | ORAL | Status: DC

## 2015-01-02 DIAGNOSIS — C4491 Basal cell carcinoma of skin, unspecified: Secondary | ICD-10-CM

## 2015-01-02 HISTORY — DX: Basal cell carcinoma of skin, unspecified: C44.91

## 2015-01-07 DIAGNOSIS — M5136 Other intervertebral disc degeneration, lumbar region: Secondary | ICD-10-CM | POA: Diagnosis not present

## 2015-01-07 DIAGNOSIS — M4317 Spondylolisthesis, lumbosacral region: Secondary | ICD-10-CM | POA: Diagnosis not present

## 2015-01-07 DIAGNOSIS — M4716 Other spondylosis with myelopathy, lumbar region: Secondary | ICD-10-CM | POA: Diagnosis not present

## 2015-01-07 DIAGNOSIS — E1142 Type 2 diabetes mellitus with diabetic polyneuropathy: Secondary | ICD-10-CM | POA: Diagnosis not present

## 2015-01-10 ENCOUNTER — Telehealth: Payer: Self-pay | Admitting: Family Medicine

## 2015-01-10 NOTE — Telephone Encounter (Signed)
Is concerned that Dr Dorris Fetch isn't working for her. States he has her taking 20 units of novolin twice daily with a meal but her fasting sugars is usually in the 50- 80 range so she doesn't take it in the am and she fell yesterday in the bathroom because she felt light headed. She does take the 2nd dose of insulin with her evening meal (her Nov a1c was 9.5) She is scheduled to have a steriod injection tomorrow with Lyla Son and wants to know if you think that would be ok or if you have any suggestions on what she should do about her insulin?

## 2015-01-10 NOTE — Telephone Encounter (Addendum)
I recommend she sched appt with Dr Dorris Fetch to furhter address blood sugar concerns, her HBa1C was 9.5 in Nov which is very high  The steroid injection will raise her blood syugar as she is aware, may need to ask dr Lyla Son to delay the injection since having probs with ehr blood sugar currently BUT will need to see Dr Dorris Fetch to work furhter on the blood sugar.  IF her pain is excessive, then I recommend she take the shot and work the best she can with the sugar with back up from Dr Dorris Fetch

## 2015-01-10 NOTE — Telephone Encounter (Signed)
Please call [name] [name], she needs to speak to someone regarding her diabetes

## 2015-01-11 DIAGNOSIS — R4182 Altered mental status, unspecified: Secondary | ICD-10-CM | POA: Diagnosis not present

## 2015-01-11 DIAGNOSIS — R531 Weakness: Secondary | ICD-10-CM | POA: Diagnosis not present

## 2015-01-11 DIAGNOSIS — Z794 Long term (current) use of insulin: Secondary | ICD-10-CM | POA: Diagnosis not present

## 2015-01-11 DIAGNOSIS — E161 Other hypoglycemia: Secondary | ICD-10-CM | POA: Diagnosis not present

## 2015-01-11 DIAGNOSIS — E785 Hyperlipidemia, unspecified: Secondary | ICD-10-CM | POA: Diagnosis not present

## 2015-01-11 DIAGNOSIS — Z888 Allergy status to other drugs, medicaments and biological substances status: Secondary | ICD-10-CM | POA: Diagnosis not present

## 2015-01-11 DIAGNOSIS — R410 Disorientation, unspecified: Secondary | ICD-10-CM | POA: Diagnosis not present

## 2015-01-11 DIAGNOSIS — I10 Essential (primary) hypertension: Secondary | ICD-10-CM | POA: Diagnosis not present

## 2015-01-11 DIAGNOSIS — Z882 Allergy status to sulfonamides status: Secondary | ICD-10-CM | POA: Diagnosis not present

## 2015-01-11 DIAGNOSIS — E11649 Type 2 diabetes mellitus with hypoglycemia without coma: Secondary | ICD-10-CM | POA: Diagnosis not present

## 2015-01-11 DIAGNOSIS — R7309 Other abnormal glucose: Secondary | ICD-10-CM | POA: Diagnosis not present

## 2015-01-11 NOTE — Telephone Encounter (Signed)
Husband called this am in a panic stating that his wife was acting "crazy" and he didn't know what to do. I asked was her sugar low or if she was acting confused and he just said she was acting crazy so I advised him to call 911. He agreed

## 2015-01-12 ENCOUNTER — Telehealth: Payer: Self-pay

## 2015-01-12 DIAGNOSIS — R4182 Altered mental status, unspecified: Secondary | ICD-10-CM | POA: Diagnosis not present

## 2015-01-12 DIAGNOSIS — E11649 Type 2 diabetes mellitus with hypoglycemia without coma: Secondary | ICD-10-CM | POA: Diagnosis not present

## 2015-01-12 NOTE — Telephone Encounter (Signed)
See Dr Griffin Dakin response regarding her concern over her blood sugar and scheduling a sooner appt with Dr Dorris Fetch. Thanks

## 2015-01-12 NOTE — Telephone Encounter (Signed)
Pt states she was discharged from the hospital this a.m.  She states that she needed to see Dr Dorris Fetch right away. I explained to her that Dr Dorris Fetch was not in the office this afternoon. She could not tell me what she was in the hospital for. She told me her "sugars were low" but then told me that he sugars were in the 200's. She also said that "the sugar pill Levothyroxine was causing her sugars to drop". She couldn't verbally tell me anything that happened at the hospital. I requested records from [facility] for the discharge summary, labs, and progress notes. I explained to the pt that I would contact her back when I get these records.

## 2015-01-12 NOTE — Telephone Encounter (Signed)
Called pt to make appt for her to see Dr Dorris Fetch. Her husband called Dr Camillia Herter office with concerns of possible low blood sugars. Her husband was told to call 911 by Dr Camillia Herter office. Pt is now at [facility]. They will call to make appt upon discharge.

## 2015-01-13 ENCOUNTER — Telehealth: Payer: Self-pay

## 2015-01-13 NOTE — Telephone Encounter (Signed)
Pts husband brought BG readings by for Dr Dorris Fetch to review. After reviewing these Dr Dorris Fetch requested that [name] [name] lower The Novolin 70/30 to 15 units bid. Pt notified and agrees.

## 2015-01-14 ENCOUNTER — Telehealth: Payer: Self-pay

## 2015-01-14 NOTE — Telephone Encounter (Signed)
Pt called to see if she could use the Levemir that she had instead of the Novolin 70/30. I told her not to use the Levemir and explained to her the difference between long acting and pre-mix insulin. She agrees to stay on the Novolin 70/30.

## 2015-01-20 ENCOUNTER — Ambulatory Visit: Payer: Commercial Managed Care - HMO | Admitting: Family Medicine

## 2015-01-26 ENCOUNTER — Telehealth: Payer: Self-pay | Admitting: Family Medicine

## 2015-01-26 NOTE — Telephone Encounter (Signed)
Needs a nurse to return her call

## 2015-01-27 ENCOUNTER — Other Ambulatory Visit: Payer: Self-pay

## 2015-01-27 DIAGNOSIS — C44311 Basal cell carcinoma of skin of nose: Secondary | ICD-10-CM | POA: Diagnosis not present

## 2015-01-27 MED ORDER — DILTIAZEM HCL ER 240 MG PO CP24: ORAL_CAPSULE | ORAL | Status: DC

## 2015-01-27 MED ORDER — BENAZEPRIL-HYDROCHLOROTHIAZIDE 20-12.5 MG PO TABS: 2.0000 | ORAL_TABLET | Freq: Every day | ORAL | Status: DC

## 2015-01-27 MED ORDER — FENOFIBRATE 145 MG PO TABS: 145.0000 mg | ORAL_TABLET | Freq: Every day | ORAL | Status: DC

## 2015-01-27 NOTE — Telephone Encounter (Signed)
Refilled patient's requested medications to [facility]

## 2015-01-28 ENCOUNTER — Other Ambulatory Visit: Payer: Self-pay

## 2015-01-28 MED ORDER — OXYCODONE-ACETAMINOPHEN 10-325 MG PO TABS: ORAL_TABLET | ORAL | Status: DC

## 2015-01-31 ENCOUNTER — Encounter: Payer: Self-pay | Admitting: Gastroenterology

## 2015-01-31 ENCOUNTER — Ambulatory Visit (INDEPENDENT_AMBULATORY_CARE_PROVIDER_SITE_OTHER): Payer: Medicare HMO | Admitting: Gastroenterology

## 2015-01-31 VITALS — BP 158/67 | HR 68 | Temp 98.2°F | Ht 62.0 in | Wt 165.6 lb

## 2015-01-31 DIAGNOSIS — K5900 Constipation, unspecified: Secondary | ICD-10-CM | POA: Diagnosis not present

## 2015-01-31 NOTE — Patient Instructions (Signed)
You may open the Linzess capsule and sprinkle half in apple sauce or water daily to every other day as needed. Let me know if you would rather have Amitiza.   We will see you in 6-8 months!

## 2015-01-31 NOTE — Assessment & Plan Note (Signed)
82 year old female with chronic constipation, stopping Amitiza recently and starting Linzess 145 mcg daily. This seems too strong. Will try off label approach of dividing in half and sprinkling in applesauce. Return in 6-8 months. Patient to call if she would like to switch back to Amitiza.

## 2015-01-31 NOTE — Progress Notes (Signed)
Referring Provider: Fayrene Helper, MD Primary Care Physician:  Tula Nakayama, MD  Primary GI: Dr. Gala Romney   Chief Complaint  Patient presents with  . Follow-up    HPI:   [name] [name] is a 82 y.o. female presenting today with a history of dysphagia and early satiety. EGD recently completed (Sept 2015) with Schatzki's ring s/p dilation. CT abd/pelvis completed and unremarkable Dec 2015. Chronic constipation. Had been on Amitiza, which I prescribed in Oct 2016. She is now on Linzess, which is too strong. This was prescribed by another provider after she ran out of Amitiza samples.  Has a 90 day supply. Had been taking Protonix and Dexilant but now realizes she was double dosing. Will just take Protonix now. Biggest problem she has is her back. Notes soreness in back. Just had basal cell carcinoma removed recently from nose.     Past Medical History  Diagnosis Date  . Atrial flutter ([place]) febuary 2008       atrial flutter... DC Cardioversion...successful... holding sinus as of january 2011 coumadin therapy...discontinued...severe hematoma secondary to fall  . Hypertension   . Hyperlipidemia   . Hypercholesterolemia   . Mitral regurgitation 2010    mild ...echo...2010  . Obesity   . Sleep apnea   . Fatigue   . Depression with anxiety   . Visual loss     unspecified  . Basal cell carcinoma   . Hematochezia   . Hematoma     ...buttocks secondary to fall november 2009 with coumadin  coumadin stopped  . Diabetes mellitus     type 2  . Back pain     with radiculopathy  . Arthritis   . Osteoarthritis   . Acute cystitis   . GERD (gastroesophageal reflux disease)   . Diverticulosis of colon   . Constipation   . IBS (irritable bowel syndrome)   . Abdominal bloating   . Nausea   . Erosive esophagitis   . Aortic valve sclerosis     Ef 60 %...echo...july 2010   . Chronic granulomatous disease ([place])     right chest ... ct chest... novmber 2009  . Normal nuclear stress  test     september ,2007...no ischemia  . Anticoagulant long-term use     Coumadin  stopped...severe hematoma  from fall  . Edema     Painful, April, 2012  . Ejection fraction     EF 60-65%, echo, April 13, 2010, normal RV function, mild mitral regurgitation  . Carotid artery disease ([place])     Doppler, [facility], February 21, 2012, less than 50% bilateral carotid stenoses    Past Surgical History  Procedure Laterality Date  . Bilateral bunion removel  [id number]  . Replacement total knee bilateral      2000,2001  . Tonsillectomy  1957  . Appendectomy  1948  . Dilation and curettage of uterus  1952 and 2001  . Abdominal hysterectomy  2001  . Cataract extraction  2001    right   . Other surgical history    . Cataract extraction      left 2005  . Basal cell carcinoma excision  09/2009    right forarm and back of neck and left side   . Colonoscopy   07/17/2006    Dr. Rourk:anal canal hemorrhoids, diminutive rectal polyp status post cold biopsy.  Otherwise, normal rectum. Left sided and  transverse diverticula.  Diminutive polyp in the ascending colon, cold bx. hyperplastic polyps.   Marland Kitchen  Esophagogastroduodenoscopy   07/17/2006    Dr. Malva Limes 2 cm distal esophageal erosion consistent with erosive reflux esophagitis, otherwise normal  . Esophagogastroduodenoscopy N/A 09/10/2013    Dr. Rourk:Schatzki's ring - status post Venia Minks dilation. Mild erosive reflux esophagitis. Hiatal hernia with fosamax capsule lodged as above  . Savory dilation N/A 09/10/2013    Procedure: SAVORY DILATION;  Surgeon: Daneil Dolin, MD;  Location: [facility];  Service: Endoscopy;  Laterality: N/A;  Venia Minks dilation N/A 09/10/2013    Procedure: Venia Minks DILATION;  Surgeon: Daneil Dolin, MD;  Location: [facility];  Service: Endoscopy;  Laterality: N/A;    Current Outpatient Prescriptions  Medication Sig Dispense Refill  . aspirin 81 MG tablet Take 81 mg by mouth daily.      .  benazepril-hydrochlorthiazide (LOTENSIN HCT) 20-12.5 MG tablet Take 2 tablets by mouth daily. 180 tablet 1  . calcium carbonate (OSCAL) 1500 (600 CA) MG TABS tablet Take 2 tablets by mouth 1 day or 1 dose.    . Cholecalciferol (VITAMIN D PO) Take 1,200 mg by mouth.    . citalopram (CELEXA) 40 MG tablet TAKE 1 TABLET BY MOUTH EVERY DAY 30 tablet 5  . dexlansoprazole (DEXILANT) 60 MG capsule Take 60 mg by mouth daily.    Marland Kitchen diltiazem (DILT-XR) 240 MG 24 hr capsule TAKE 1 CAPSULE BY MOUTH DAILY. 90 capsule 1  . fenofibrate (TRICOR) 145 MG tablet Take 1 tablet (145 mg total) by mouth at bedtime. 90 tablet 1  . fish oil-omega-3 fatty acids 1000 MG capsule Take 1 g by mouth daily. 2 caps bid     . fluticasone (FLONASE) 50 MCG/ACT nasal spray PLACE 2 SPRAYS INTO BOTH NOSTRILS DAILY. 16 g 6  . furosemide (LASIX) 40 MG tablet TAKE 1 TABLET BY MOUTH EVERY DAY AS DIRECTED 30 tablet 11  . gabapentin (NEURONTIN) 100 MG capsule TAKE 2 CAPSULES BY MOUTH THREE TIMES DAILY (DOSE INCREASE) 180 capsule 3  . insulin NPH-regular Human (NOVOLIN 70/30) (70-30) 100 UNIT/ML injection Inject 20 Units into the skin 2 (two) times daily with a meal. 10 mL 2  . L-Methylfolate-Algae-B12-B6 (METANX) 3-90.314-2-35 MG CAPS TAKE 1 CAPSULE BY MOUTH EVERY DAY 30 capsule 2  . levothyroxine (SYNTHROID, LEVOTHROID) 25 MCG tablet Take 2 tablets (50 mcg total) by mouth daily. 30 tablet 3  . Linaclotide (LINZESS) 145 MCG CAPS capsule Take 145 mcg by mouth daily as needed.    Marland Kitchen oxyCODONE-acetaminophen (PERCOCET) 10-325 MG tablet One tablet four times daily for severe uncontrolled pain 120 tablet 0  . pantoprazole (PROTONIX) 40 MG tablet Take 40 mg by mouth daily.    . ranitidine (ZANTAC) 150 MG capsule Take 150 mg by mouth every evening.    . rosuvastatin (CRESTOR) 40 MG tablet TAKE 1 TABLET BY MOUTH EVERY DAILY 30 tablet 5  . glucose blood (ACCU-CHEK AVIVA PLUS) test strip Use as instructed 3 x daily. Dx E11.65 100 each 5   No current  facility-administered medications for this visit.    Allergies as of 01/31/2015 - Review Complete 01/31/2015  Allergen Reaction Noted  . Lisinopril Nausea And Vomiting   . Reclast [zoledronic acid] Other (See Comments) 02/23/2014  . Alprazolam Other (See Comments)   . Losartan Other (See Comments) 04/08/2013  . Sertraline hcl Diarrhea 05/24/2008  . Sulfonamide derivatives Rash     Family History  Problem Relation Age of Onset  . Dementia Mother   . Heart disease Father   . Diabetes    .  Arthritis    . Colon cancer Neg Hx     Social History   Social History  . Marital Status: Married    Spouse Name: N/A  . Number of Children: N/A  . Years of Education: 13   Occupational History  . retired    Social History Main Topics  . Smoking status: Never Smoker   . Smokeless tobacco: Never Used  . Alcohol Use: No  . Drug Use: No  . Sexual Activity: Not Currently   Other Topics Concern  . None   Social History Narrative    Review of Systems: As mentioned in HPI   Physical Exam: BP 158/67 mmHg  Pulse 68  Temp(Src) 98.2 F (36.8 C) (Oral)  Ht 5\' 2"  (1.575 m)  Wt 165 lb 9.6 oz (75.116 kg)  BMI 30.28 kg/m2 General:   Alert and oriented. No distress noted. Pleasant and cooperative.  Head:  Normocephalic and atraumatic. Eyes:  Conjuctiva clear without scleral icterus. Mouth:  Oral mucosa pink and moist. Good dentition. No lesions. Abdomen:  +BS, soft, non-tender and non-distended. No rebound or guarding. Likely umbilical hernia noted Msk:  Moderate kyphosis noted Extremities:  Without edema. Neurologic:  Alert and  oriented x4;  grossly normal neurologically. Psych:  Alert and cooperative. Normal mood and affect.

## 2015-01-31 NOTE — Progress Notes (Signed)
CC'D TO PCP °

## 2015-02-01 ENCOUNTER — Encounter: Payer: Self-pay | Admitting: Family Medicine

## 2015-02-01 ENCOUNTER — Ambulatory Visit (INDEPENDENT_AMBULATORY_CARE_PROVIDER_SITE_OTHER): Payer: Medicare HMO | Admitting: Family Medicine

## 2015-02-01 VITALS — BP 128/72 | HR 60 | Resp 16 | Ht 62.0 in | Wt 167.0 lb

## 2015-02-01 DIAGNOSIS — Z09 Encounter for follow-up examination after completed treatment for conditions other than malignant neoplasm: Secondary | ICD-10-CM | POA: Diagnosis not present

## 2015-02-01 DIAGNOSIS — I10 Essential (primary) hypertension: Secondary | ICD-10-CM

## 2015-02-01 DIAGNOSIS — E1121 Type 2 diabetes mellitus with diabetic nephropathy: Secondary | ICD-10-CM

## 2015-02-01 DIAGNOSIS — M545 Low back pain: Secondary | ICD-10-CM

## 2015-02-01 DIAGNOSIS — K219 Gastro-esophageal reflux disease without esophagitis: Secondary | ICD-10-CM | POA: Diagnosis not present

## 2015-02-01 DIAGNOSIS — M5440 Lumbago with sciatica, unspecified side: Secondary | ICD-10-CM

## 2015-02-01 DIAGNOSIS — R296 Repeated falls: Secondary | ICD-10-CM

## 2015-02-01 DIAGNOSIS — M5430 Sciatica, unspecified side: Secondary | ICD-10-CM

## 2015-02-01 DIAGNOSIS — K222 Esophageal obstruction: Secondary | ICD-10-CM

## 2015-02-01 NOTE — Assessment & Plan Note (Addendum)
Hospitalized at [facility] 01/10 to 01/11 for low blood sugar dropped to 40 overslept, now dose of insulin at 15 units twice daily , reports improvement, has upcoming appointment with endo who treats her diabetes and is strongly encouraged to keep all appts ,and to follow directions as medication she requires needs to be closely monitored, she understands, and generally makes very good effort to control her blood sugar

## 2015-02-01 NOTE — Progress Notes (Signed)
   Subjective:    Patient ID: [name] [name], female    DOB: 18-Aug-1929, 82 y.o.   MRN: [id number]  HPI Pt in for f/u of recent hospitalization , overnight from 1/10 to 01/12/2015, with hypoglycemia. States she overslept , and as a result , missed a meal and became extremely confused and disoriented , requiring in house care. Of note she has reported that her blood sugars had been falling more than she was comfortable with and I had requested she directly contact her endo who is directly  responsible  Hospital records are reviewed, she is now on a lower dose of medication and reports doing better Recently had cancer removed from her nose, basal cell, she has had skin cancer in the past Mobility continues to be a challenge , she does rely on her assistive device and has had no falls recently   Review of Systems    See HPI Denies recent fever or chills. Denies sinus pressure, nasal congestion, ear pain or sore throat. Denies chest congestion, productive cough or wheezing. Denies chest pains, palpitations and leg swelling Denies abdominal pain, nausea, vomiting,diarrhea or constipation.   Denies dysuria, frequency, hesitancy Denies uncontrolled  depression, anxiety or insomnia. Denies skin break down or rash.     Objective:   Physical Exam  BP 128/72 mmHg  Pulse 60  Resp 16  Ht 5\' 2"  (1.575 m)  Wt 167 lb (75.751 kg)  BMI 30.54 kg/m2  SpO2 96% Patient alert and oriented and in no cardiopulmonary distress.  HEENT: No facial asymmetry, EOMI,   oropharynx pink and moist.  Neck supple no JVD, no mass.  Chest: Clear to auscultation bilaterally.  CVS: S1, S2 no murmurs, no S3.Regular rate.  ABD: Soft non tender.   Ext: No edema  MS: decreased  ROM spine, shoulders, hips and knees.  Skin: Intact, no ulcerations or rash noted.  Psych: Good eye contact, normal affect. Memory intact not anxious or depressed appearing.  CNS: CN 2-12 intact, power,  normal throughout.no focal  deficits noted.       Assessment & Plan:  Hospital discharge follow-up Hospitalized at [facility] 01/10 to 01/11 for low blood sugar dropped to 40 overslept, now dose of insulin at 15 units twice daily , reports improvement, has upcoming appointment with endo who treats her diabetes and is strongly encouraged to keep all appts ,and to follow directions as medication she requires needs to be closely monitored, she understands, and generally makes very good effort to control her blood sugar  Essential hypertension Controlled, no change in medication DASH diet and commitment to daily physical activity for a minimum of 30 minutes discussed and encouraged, as a part of hypertension management. The importance of attaining a healthy weight is also discussed.  BP/Weight 02/01/2015 01/31/2015 11/29/2014 10/27/2014 10/26/2014 09/29/2014 [id number]  Systolic BP 0000000 0000000 [id number] 0000000 123456 [id number] [id number]  Diastolic BP 72 67 72 56 76 65 66  Wt. (Lbs) 167 165.6 166 169.6 163 168.12 168.12  BMI 30.54 30.28 30.35 31.01 29.81 30.74 30.74        GERD with stricture Asymptomatic and currently controlled   Diabetes mellitus with nephropathy Uncontrolled, managed by endo, recently had significant hypoglycemia requiring hospitalization from 1/10 to 01/12/2015. Reviewed dangers of hypoglycemia with pt and importance of adherence to treatment plan  Back pain of lumbar region with sciatica No current flare, chrinic pain management as before  Multiple falls Home safety reviewed briefly with the patient

## 2015-02-01 NOTE — Patient Instructions (Signed)
Annual physical exam in 3 month, call if you need me sooner  Please set an alarm clock so you wake and eat on time, EAT ON a REGULAR SCHEDULE  Thankful you were able to get  Cancer removed from your nose IN TIME , last week, and that your blood sugars are better.  Blood pressure today is great.  Continue current medication  Use walker and be careful not to fall!

## 2015-02-08 ENCOUNTER — Other Ambulatory Visit: Payer: Self-pay | Admitting: "Endocrinology

## 2015-02-09 DIAGNOSIS — R69 Illness, unspecified: Secondary | ICD-10-CM | POA: Diagnosis not present

## 2015-02-10 DIAGNOSIS — E1142 Type 2 diabetes mellitus with diabetic polyneuropathy: Secondary | ICD-10-CM | POA: Diagnosis not present

## 2015-02-10 DIAGNOSIS — B351 Tinea unguium: Secondary | ICD-10-CM | POA: Diagnosis not present

## 2015-02-10 DIAGNOSIS — L84 Corns and callosities: Secondary | ICD-10-CM | POA: Diagnosis not present

## 2015-02-13 NOTE — Assessment & Plan Note (Signed)
Uncontrolled, managed by endo, recently had significant hypoglycemia requiring hospitalization from 1/10 to 01/12/2015. Reviewed dangers of hypoglycemia with pt and importance of adherence to treatment plan

## 2015-02-13 NOTE — Assessment & Plan Note (Signed)
Home safety reviewed briefly with the patient

## 2015-02-13 NOTE — Assessment & Plan Note (Signed)
Controlled, no change in medication DASH diet and commitment to daily physical activity for a minimum of 30 minutes discussed and encouraged, as a part of hypertension management. The importance of attaining a healthy weight is also discussed.  BP/Weight 02/01/2015 01/31/2015 11/29/2014 10/27/2014 10/26/2014 09/29/2014 [id number]  Systolic BP 0000000 0000000 Q000111Q 0000000 123456 123456 123456  Diastolic BP 72 67 72 56 76 65 66  Wt. (Lbs) 167 165.6 166 169.6 163 168.12 168.12  BMI 30.54 30.28 30.35 31.01 29.81 30.74 30.74

## 2015-02-13 NOTE — Assessment & Plan Note (Signed)
No current flare, chrinic pain management as before

## 2015-02-13 NOTE — Assessment & Plan Note (Signed)
Asymptomatic and currently controlled

## 2015-02-16 ENCOUNTER — Other Ambulatory Visit: Payer: Self-pay | Admitting: Family Medicine

## 2015-02-24 ENCOUNTER — Other Ambulatory Visit: Payer: Self-pay | Admitting: "Endocrinology

## 2015-02-24 ENCOUNTER — Other Ambulatory Visit: Payer: Self-pay

## 2015-02-24 DIAGNOSIS — E039 Hypothyroidism, unspecified: Secondary | ICD-10-CM | POA: Diagnosis not present

## 2015-02-24 DIAGNOSIS — E1121 Type 2 diabetes mellitus with diabetic nephropathy: Secondary | ICD-10-CM | POA: Diagnosis not present

## 2015-02-24 LAB — TSH: TSH: 1.78 mIU/L

## 2015-02-24 LAB — T4, FREE: Free T4: 1.2 ng/dL (ref 0.8–1.8)

## 2015-02-24 LAB — BASIC METABOLIC PANEL
BUN: 36 mg/dL — ABNORMAL HIGH (ref 7–25)
CO2: 29 mmol/L (ref 20–31)
Calcium: 9.6 mg/dL (ref 8.6–10.4)
Chloride: 102 mmol/L (ref 98–110)
Creat: 1.13 mg/dL — ABNORMAL HIGH (ref 0.60–0.88)
GLUCOSE: 126 mg/dL — AB (ref 65–99)
POTASSIUM: 5.3 mmol/L (ref 3.5–5.3)
SODIUM: 141 mmol/L (ref 135–146)

## 2015-02-24 MED ORDER — OXYCODONE-ACETAMINOPHEN 10-325 MG PO TABS: ORAL_TABLET | ORAL | Status: DC

## 2015-02-25 LAB — HEMOGLOBIN A1C
HEMOGLOBIN A1C: 7.3 % — AB (ref <5.7)
MEAN PLASMA GLUCOSE: 163 mg/dL — AB (ref <117)

## 2015-03-03 ENCOUNTER — Ambulatory Visit (INDEPENDENT_AMBULATORY_CARE_PROVIDER_SITE_OTHER): Payer: Commercial Managed Care - HMO | Admitting: "Endocrinology

## 2015-03-03 ENCOUNTER — Encounter: Payer: Self-pay | Admitting: "Endocrinology

## 2015-03-03 VITALS — BP 143/85 | HR 70 | Ht 62.0 in | Wt 165.0 lb

## 2015-03-03 DIAGNOSIS — E1121 Type 2 diabetes mellitus with diabetic nephropathy: Secondary | ICD-10-CM

## 2015-03-03 DIAGNOSIS — I10 Essential (primary) hypertension: Secondary | ICD-10-CM | POA: Diagnosis not present

## 2015-03-03 DIAGNOSIS — E039 Hypothyroidism, unspecified: Secondary | ICD-10-CM | POA: Diagnosis not present

## 2015-03-03 DIAGNOSIS — E785 Hyperlipidemia, unspecified: Secondary | ICD-10-CM

## 2015-03-03 MED ORDER — LEVOTHYROXINE SODIUM 50 MCG PO TABS: 50.0000 ug | ORAL_TABLET | Freq: Every day | ORAL | Status: DC

## 2015-03-03 NOTE — Progress Notes (Signed)
Subjective:    Patient ID: [name] [name], female    DOB: 06/21/1929, PCP [name] Nakayama, MD   Past Medical History  Diagnosis Date  . Atrial flutter ([place]) febuary 2008       atrial flutter... DC Cardioversion...successful... holding sinus as of january 2011 coumadin therapy...discontinued...severe hematoma secondary to fall  . Hypertension   . Hyperlipidemia   . Hypercholesterolemia   . Mitral regurgitation 2010    mild ...echo...2010  . Obesity   . Sleep apnea   . Fatigue   . Depression with anxiety   . Visual loss     unspecified  . Hematochezia   . Hematoma     ...buttocks secondary to fall november 2009 with coumadin  coumadin stopped  . Diabetes mellitus     type 2  . Back pain     with radiculopathy  . Arthritis   . Osteoarthritis   . Acute cystitis   . GERD (gastroesophageal reflux disease)   . Diverticulosis of colon   . Constipation   . IBS (irritable bowel syndrome)   . Abdominal bloating   . Nausea   . Erosive esophagitis   . Aortic valve sclerosis     Ef 60 %...echo...july 2010   . Chronic granulomatous disease ([place])     right chest ... ct chest... novmber 2009  . Normal nuclear stress test     september ,2007...no ischemia  . Anticoagulant long-term use     Coumadin  stopped...severe hematoma  from fall  . Edema     Painful, April, 2012  . Ejection fraction     EF 60-65%, echo, April 13, 2010, normal RV function, mild mitral regurgitation  . Carotid artery disease ([place])     Doppler, [facility], February 21, 2012, less than 50% bilateral carotid stenoses  . Basal cell carcinoma 01/2015    nose   Past Surgical History  Procedure Laterality Date  . Bilateral bunion removel  [id number]  . Replacement total knee bilateral      2000,2001  . Tonsillectomy  1957  . Appendectomy  1948  . Dilation and curettage of uterus  1952 and 2001  . Abdominal hysterectomy  2001  . Cataract extraction  2001    right   . Other surgical history    .  Cataract extraction      left 2005  . Basal cell carcinoma excision  09/2009    right forarm and back of neck and left side   . Colonoscopy   07/17/2006    Dr. Rourk:anal canal hemorrhoids, diminutive rectal polyp status post cold biopsy.  Otherwise, normal rectum. Left sided and  transverse diverticula.  Diminutive polyp in the ascending colon, cold bx. hyperplastic polyps.   . Esophagogastroduodenoscopy   07/17/2006    Dr. Rourk:single 2 cm distal esophageal erosion consistent with erosive reflux esophagitis, otherwise normal  . Esophagogastroduodenoscopy N/A 09/10/2013    Dr. Rourk:Schatzki's ring - status post Venia Minks dilation. Mild erosive reflux esophagitis. Hiatal hernia with fosamax capsule lodged as above  . Savory dilation N/A 09/10/2013    Procedure: SAVORY DILATION;  Surgeon: Daneil Dolin, MD;  [facility];  Service: Endoscopy;  Laterality: N/A;  Venia Minks dilation N/A 09/10/2013    Procedure: Venia Minks DILATION;  Surgeon: Daneil Dolin, MD;  Location: [facility];  Service: Endoscopy;  Laterality: N/A;   Social History   Social History  . Marital Status: Married    Spouse Name: N/A  .  Number of Children: N/A  . Years of Education: 13   Occupational History  . retired    Social History Main Topics  . Smoking status: Never Smoker   . Smokeless tobacco: Never Used  . Alcohol Use: No  . Drug Use: No  . Sexual Activity: Not Currently   Other Topics Concern  . None   Social History Narrative   Outpatient Encounter Prescriptions as of 03/03/2015  Medication Sig  . aspirin 81 MG tablet Take 81 mg by mouth daily.    . benazepril-hydrochlorthiazide (LOTENSIN HCT) 20-12.5 MG tablet Take 2 tablets by mouth daily.  . calcium carbonate (OSCAL) 1500 (600 CA) MG TABS tablet Take 2 tablets by mouth 1 day or 1 dose.  . citalopram (CELEXA) 40 MG tablet TAKE ONE TABLET BY MOUTH ONCE DAILY  . dexlansoprazole (DEXILANT) 60 MG capsule Take 60 mg by mouth daily.  Marland Kitchen  diltiazem (DILT-XR) 240 MG 24 hr capsule TAKE 1 CAPSULE BY MOUTH DAILY.  . ergocalciferol (VITAMIN D2) 50000 units capsule Take 50,000 Units by mouth once a week.  . fenofibrate (TRICOR) 145 MG tablet Take 1 tablet (145 mg total) by mouth at bedtime.  . fish oil-omega-3 fatty acids 1000 MG capsule Take 1 g by mouth daily. 2 caps bid   . fluticasone (FLONASE) 50 MCG/ACT nasal spray PLACE 2 SPRAYS INTO BOTH NOSTRILS DAILY.  . furosemide (LASIX) 40 MG tablet TAKE 1 TABLET BY MOUTH EVERY DAY AS DIRECTED  . gabapentin (NEURONTIN) 100 MG capsule TAKE 2 CAPSULES BY MOUTH THREE TIMES DAILY (DOSE INCREASE)  . glucose blood (ACCU-CHEK AVIVA PLUS) test strip Use as instructed 3 x daily. Dx E11.65  . L-Methylfolate-Algae-B12-B6 (METANX) 3-90.314-2-35 MG CAPS TAKE 1 CAPSULE BY MOUTH EVERY DAY  . levothyroxine (SYNTHROID, LEVOTHROID) 50 MCG tablet Take 1 tablet (50 mcg total) by mouth daily.  . Linaclotide (LINZESS) 145 MCG CAPS capsule Take 145 mcg by mouth daily as needed.  . Multiple Vitamin (MULTIVITAMIN) tablet Take 1 tablet by mouth daily.  Marland Kitchen NOVOLIN 70/30 RELION (70-30) 100 UNIT/ML injection INJECT SUBCUTANEOUSLY 20 UNITS TWICE DAILY WITH A MEAL  . oxyCODONE-acetaminophen (PERCOCET) 10-325 MG tablet One tablet four times daily for severe uncontrolled pain  . pantoprazole (PROTONIX) 40 MG tablet Take 40 mg by mouth daily.  . ranitidine (ZANTAC) 150 MG capsule Take 150 mg by mouth every evening.  . rosuvastatin (CRESTOR) 40 MG tablet TAKE 1 TABLET BY MOUTH EVERY DAILY  . [DISCONTINUED] levothyroxine (SYNTHROID, LEVOTHROID) 25 MCG tablet Take 2 tablets (50 mcg total) by mouth daily.   No facility-administered encounter medications on file as of 03/03/2015.   ALLERGIES: Allergies  Allergen Reactions  . Lisinopril Nausea And Vomiting  . Reclast [Zoledronic Acid] Other (See Comments)    Jaw pain, and possible deterioration in kidney function  . Alprazolam Other (See Comments)    hyper  . Losartan  Other (See Comments)    Raw throat  . Sertraline Hcl Diarrhea  . Sulfonamide Derivatives Rash   VACCINATION STATUS: Immunization History  Administered Date(s) Administered  . H1N1 11/04/2007  . Influenza Split 10/12/2010, 09/28/2011  . Influenza Whole 10/25/2005, 11/01/2008, 10/04/2009  . Influenza,inj,Quad PF,36+ Mos 11/10/2012, 10/29/2013, 09/28/2014  . Pneumococcal Conjugate-13 05/04/2014  . Pneumococcal Polysaccharide-23 05/27/2003  . Td 10/04/2009  . Zoster 03/25/2006    Diabetes She presents for her follow-up diabetic visit. She has type 2 diabetes mellitus. Onset time: She was diagnosed at approximate age of 73 years. Her disease course has been improving.  There are no hypoglycemic associated symptoms. Pertinent negatives for hypoglycemia include no confusion, headaches, pallor or seizures. Associated symptoms include polydipsia and polyuria. Pertinent negatives for diabetes include no chest pain and no polyphagia. There are no hypoglycemic complications. Diabetic complications include nephropathy. Risk factors for coronary artery disease include diabetes mellitus, dyslipidemia, hypertension and sedentary lifestyle. When asked about current treatments, none were reported. She is compliant with treatment some of the time. She is following a generally unhealthy diet. She has not had a previous visit with a dietitian. She never participates in exercise. Her breakfast blood glucose range is generally 140-180 mg/dl. Her dinner blood glucose range is generally 140-180 mg/dl. Her overall blood glucose range is >200 mg/dl. An ACE inhibitor/angiotensin II receptor blocker is being taken. Eye exam is current.  Thyroid Problem Presents for initial visit. Onset time: she is being diagnosed with primary hypothyroidism today. Patient reports no cold intolerance, diarrhea, heat intolerance or palpitations. Past treatments include nothing. The following procedures have not been performed: radioiodine  uptake scan, thyroid FNA, thyroid ultrasound and thyroidectomy. Her past medical history is significant for hyperlipidemia.  Hyperlipidemia This is a chronic problem. The current episode started more than 1 year ago. Pertinent negatives include no chest pain, myalgias or shortness of breath. Current antihyperlipidemic treatment includes statins.  Hypertension This is a chronic problem. The current episode started more than 1 year ago. The problem is controlled. Pertinent negatives include no chest pain, headaches, palpitations or shortness of breath. Past treatments include ACE inhibitors. Hypertensive end-organ damage includes a thyroid problem.     Review of Systems  Constitutional: Negative for unexpected weight change.  HENT: Negative for trouble swallowing and voice change.   Eyes: Negative for visual disturbance.  Respiratory: Negative for cough, shortness of breath and wheezing.   Cardiovascular: Negative for chest pain, palpitations and leg swelling.  Gastrointestinal: Negative for nausea, vomiting and diarrhea.  Endocrine: Positive for polydipsia and polyuria. Negative for cold intolerance, heat intolerance and polyphagia.  Musculoskeletal: Positive for back pain and arthralgias. Negative for myalgias.  Skin: Negative for color change, pallor, rash and wound.  Neurological: Negative for seizures and headaches.  Psychiatric/Behavioral: Negative for suicidal ideas and confusion.    Objective:    BP 143/85 mmHg  Pulse 70  Ht 5\' 2"  (1.575 m)  Wt 165 lb (74.844 kg)  BMI 30.17 kg/m2  SpO2 98%  Wt Readings from Last 3 Encounters:  03/03/15 165 lb (74.844 kg)  02/01/15 167 lb (75.751 kg)  01/31/15 165 lb 9.6 oz (75.116 kg)    Physical Exam  Constitutional: She is oriented to person, place, and time. She appears well-developed.  Walks with her walker.  HENT:  Head: Normocephalic and atraumatic.  Eyes: EOM are normal.  Neck: Normal range of motion. Neck supple. No tracheal  deviation present. No thyromegaly present.  Cardiovascular: Normal rate and regular rhythm.   Pulmonary/Chest: Effort normal and breath sounds normal.  Abdominal: Soft. Bowel sounds are normal. There is no tenderness. There is no guarding.  Musculoskeletal: She exhibits no edema.  Walks with her walker.  Neurological: She is alert and oriented to person, place, and time. She has normal reflexes. No cranial nerve deficit. Coordination normal.  Skin: Skin is warm and dry. No rash noted. No erythema. No pallor.  Psychiatric: She has a normal mood and affect. Judgment normal.    Results for orders placed or performed in visit on 02/24/15  TSH  Result Value Ref Range   TSH 1.78  mIU/L  T4, free  Result Value Ref Range   Free T4 1.2 0.8 - 1.8 ng/dL   Complete Blood Count (Most recent): Lab Results  Component Value Date   WBC 6.0 07/08/2014   HGB 11.3* 07/08/2014   HCT 34.2* 07/08/2014   MCV 93.2 07/08/2014   PLT 251 07/08/2014   Chemistry (most recent): Lab Results  Component Value Date   NA 141 02/24/2015   K 5.3 02/24/2015   CL 102 02/24/2015   CO2 29 02/24/2015   BUN 36* 02/24/2015   CREATININE 1.13* 02/24/2015   Diabetic Labs (most recent): Lab Results  Component Value Date   HGBA1C 7.3* 02/24/2015   HGBA1C 9.5* 11/18/2014   HGBA1C 8.8* 02/22/2014   Lipid Panel     Component Value Date/Time   CHOL 158 08/14/2014 0804   TRIG 143 08/14/2014 0804   HDL 70 08/14/2014 0804   CHOLHDL 2.3 08/14/2014 0804   VLDL 29 08/14/2014 0804   LDLCALC 59 08/14/2014 0804     Assessment & Plan:   1. Diabetes mellitus with nephropathy ([place])  - Her diabetes is complicated by CKD, and she  remains at a high risk for more acute and chronic complications of diabetes which include CAD, CVA, CKD, retinopathy, and neuropathy. These are all discussed in detail with the patient.  Patient came with controlled glucose profile, and  recent A1c has improved to 7.3% from   9.5 %.  Her blood  glucose readings will be scanned into her medical records.  Recent labs reviewed.   - I have re-counseled the patient on diet management   by adopting a carbohydrate restricted / protein rich  Diet.  - Suggestion is made for patient to avoid simple carbohydrates   from their diet including Cakes , Desserts, Ice Cream,  Soda (  diet and regular) , Sweet Tea , Candies,  Chips, Cookies, Artificial Sweeteners,   and "Sugar-free" Products .  This will help patient to have stable blood glucose profile and potentially avoid unintended  Weight gain.  - Patient is advised to stick to a routine mealtimes to eat 3 meals  a day and avoid unnecessary snacks ( to snack only to correct hypoglycemia).  - I have approached patient with the following individualized plan to manage diabetes and patient agrees.  - Given the long duration of her diabetes, she will need insulin therapy, she did reasonably well on premixed Novolin 70/30. - I will continue  Novolin 70/30 15 units AC BRKFST and supper, associated with strict monitoring of glucose  AC and HS. - Patient is warned not to take insulin without proper monitoring per orders. -Adjustment parameters are given for hypo and hyperglycemia in writing. -Patient is encouraged to call clinic for blood glucose levels less than 70 or above 300 mg /dl.  -Patient is not a candidate for MTF, SGLT2 i  due to CKD.  - Patient specific target  for A1c; LDL, HDL, Triglycerides, and  Waist Circumference were discussed in detail.  2) BP/HTN: controlled. Continue current medications including ACEI/ARB. 3) Lipids/HPL:  continue statins. 4)  Weight/Diet: CDE consult in progress, exercise, and carbohydrates information provided. 5) hypothyroidism: Her labs are consistent with appropriate replacement. I will continue levothyroxine 50 g by mouth every morning.  - We discussed about correct intake of levothyroxine, at fasting, with water, separated by at least 30 minutes from  breakfast, and separated by more than 4 hours from calcium, iron, multivitamins, acid reflux medications (PPIs). -Patient is made  aware of the fact that thyroid hormone replacement is needed for life, dose to be adjusted by periodic monitoring of thyroid function tests.  6) Chronic Care/Health Maintenance:  -Patient is on ACEI/ARB and Statin medications and encouraged to continue to follow up with Ophthalmology, Podiatrist at least yearly or according to recommendations, and advised to  stay away from smoking. I have recommended yearly flu vaccine and pneumonia vaccination at least every 5 years; moderate intensity exercise for up to 150 minutes weekly; and  sleep for at least 7 hours a day.  - 25 minutes of time was spent on the care of this patient , 50% of which was applied for counseling on diabetes complications and their preventions.  - I advised patient to maintain close follow up with [name] Nakayama, MD for primary care needs.  Patient is asked to bring meter and  blood glucose logs during their next visit.   Follow up plan: -Return in about 3 months (around 06/03/2015) for diabetes, high blood pressure, high cholesterol, underactive thyroid, follow up with pre-visit labs, meter, and logs.  Glade Lloyd, MD Phone: [phone]  Fax: [phone]   03/03/2015, 11:29 AM

## 2015-03-04 ENCOUNTER — Telehealth: Payer: Self-pay | Admitting: Family Medicine

## 2015-03-04 ENCOUNTER — Other Ambulatory Visit: Payer: Self-pay

## 2015-03-04 NOTE — Telephone Encounter (Signed)
[name] [name] is asking to speak to Loma Sousa regarding her pharmacy Rx Outreach, she has yet to receive her medications from January from them and she is almost out of medication

## 2015-03-07 ENCOUNTER — Other Ambulatory Visit: Payer: Self-pay

## 2015-03-07 MED ORDER — DILTIAZEM HCL ER 240 MG PO CP24: ORAL_CAPSULE | ORAL | Status: DC

## 2015-03-07 MED ORDER — BENAZEPRIL-HYDROCHLOROTHIAZIDE 20-12.5 MG PO TABS: 2.0000 | ORAL_TABLET | Freq: Every day | ORAL | Status: DC

## 2015-03-07 MED ORDER — FENOFIBRATE 145 MG PO TABS: 145.0000 mg | ORAL_TABLET | Freq: Every day | ORAL | Status: DC

## 2015-03-07 NOTE — Telephone Encounter (Signed)
Called patient and she states that she never received medications from mail order company.   Prescriptions re faxed.  Will call and check with pharmacy.

## 2015-03-14 DIAGNOSIS — Z794 Long term (current) use of insulin: Secondary | ICD-10-CM | POA: Diagnosis not present

## 2015-03-14 DIAGNOSIS — E782 Mixed hyperlipidemia: Secondary | ICD-10-CM | POA: Diagnosis not present

## 2015-03-14 DIAGNOSIS — R69 Illness, unspecified: Secondary | ICD-10-CM | POA: Diagnosis not present

## 2015-03-14 DIAGNOSIS — E119 Type 2 diabetes mellitus without complications: Secondary | ICD-10-CM | POA: Diagnosis not present

## 2015-03-14 DIAGNOSIS — K219 Gastro-esophageal reflux disease without esophagitis: Secondary | ICD-10-CM | POA: Diagnosis not present

## 2015-03-15 ENCOUNTER — Other Ambulatory Visit: Payer: Self-pay | Admitting: Family Medicine

## 2015-03-25 ENCOUNTER — Other Ambulatory Visit: Payer: Self-pay

## 2015-03-25 MED ORDER — OXYCODONE-ACETAMINOPHEN 10-325 MG PO TABS: ORAL_TABLET | ORAL | Status: DC

## 2015-03-31 DIAGNOSIS — R69 Illness, unspecified: Secondary | ICD-10-CM | POA: Diagnosis not present

## 2015-04-12 DIAGNOSIS — E119 Type 2 diabetes mellitus without complications: Secondary | ICD-10-CM | POA: Diagnosis not present

## 2015-04-12 DIAGNOSIS — M4806 Spinal stenosis, lumbar region: Secondary | ICD-10-CM | POA: Diagnosis not present

## 2015-04-12 DIAGNOSIS — I2510 Atherosclerotic heart disease of native coronary artery without angina pectoris: Secondary | ICD-10-CM | POA: Diagnosis not present

## 2015-04-12 DIAGNOSIS — I10 Essential (primary) hypertension: Secondary | ICD-10-CM | POA: Diagnosis not present

## 2015-04-12 DIAGNOSIS — K219 Gastro-esophageal reflux disease without esophagitis: Secondary | ICD-10-CM | POA: Diagnosis not present

## 2015-04-12 DIAGNOSIS — Z794 Long term (current) use of insulin: Secondary | ICD-10-CM | POA: Diagnosis not present

## 2015-04-12 DIAGNOSIS — E785 Hyperlipidemia, unspecified: Secondary | ICD-10-CM | POA: Diagnosis not present

## 2015-04-12 DIAGNOSIS — M5136 Other intervertebral disc degeneration, lumbar region: Secondary | ICD-10-CM | POA: Diagnosis not present

## 2015-04-12 DIAGNOSIS — M47816 Spondylosis without myelopathy or radiculopathy, lumbar region: Secondary | ICD-10-CM | POA: Diagnosis not present

## 2015-04-12 DIAGNOSIS — Z79899 Other long term (current) drug therapy: Secondary | ICD-10-CM | POA: Diagnosis not present

## 2015-04-21 DIAGNOSIS — L84 Corns and callosities: Secondary | ICD-10-CM | POA: Diagnosis not present

## 2015-04-21 DIAGNOSIS — E1142 Type 2 diabetes mellitus with diabetic polyneuropathy: Secondary | ICD-10-CM | POA: Diagnosis not present

## 2015-04-21 DIAGNOSIS — B351 Tinea unguium: Secondary | ICD-10-CM | POA: Diagnosis not present

## 2015-04-28 ENCOUNTER — Other Ambulatory Visit: Payer: Self-pay

## 2015-04-28 MED ORDER — OXYCODONE-ACETAMINOPHEN 10-325 MG PO TABS: ORAL_TABLET | ORAL | Status: DC

## 2015-05-04 ENCOUNTER — Telehealth: Payer: Self-pay | Admitting: Family Medicine

## 2015-05-04 MED ORDER — FUROSEMIDE 40 MG PO TABS: 40.0000 mg | ORAL_TABLET | Freq: Every day | ORAL | Status: DC

## 2015-05-04 MED ORDER — METANX 3-90.314-2-35 MG PO CAPS: 1.0000 | ORAL_CAPSULE | Freq: Every day | ORAL | Status: DC

## 2015-05-04 NOTE — Telephone Encounter (Signed)
Refills sent

## 2015-05-04 NOTE — Telephone Encounter (Signed)
Patient is asking for refills on furosemide (LASIX) 40 MG tablet and -Methylfolate-Algae-B12-B6 (METANX) 3-90.314-2-35 MG CAPS called to [facility] in [place]

## 2015-05-09 ENCOUNTER — Other Ambulatory Visit: Payer: Self-pay

## 2015-05-09 MED ORDER — PANTOPRAZOLE SODIUM 40 MG PO TBEC: 40.0000 mg | DELAYED_RELEASE_TABLET | Freq: Every day | ORAL | Status: DC

## 2015-05-12 ENCOUNTER — Encounter: Payer: Self-pay | Admitting: Family Medicine

## 2015-05-12 ENCOUNTER — Ambulatory Visit (INDEPENDENT_AMBULATORY_CARE_PROVIDER_SITE_OTHER): Payer: Medicare HMO | Admitting: Family Medicine

## 2015-05-12 VITALS — BP 140/72 | HR 53 | Resp 16 | Ht 62.0 in | Wt 168.0 lb

## 2015-05-12 DIAGNOSIS — F329 Major depressive disorder, single episode, unspecified: Secondary | ICD-10-CM

## 2015-05-12 DIAGNOSIS — E559 Vitamin D deficiency, unspecified: Secondary | ICD-10-CM

## 2015-05-12 DIAGNOSIS — E1121 Type 2 diabetes mellitus with diabetic nephropathy: Secondary | ICD-10-CM | POA: Diagnosis not present

## 2015-05-12 DIAGNOSIS — F419 Anxiety disorder, unspecified: Secondary | ICD-10-CM

## 2015-05-12 DIAGNOSIS — R69 Illness, unspecified: Secondary | ICD-10-CM | POA: Diagnosis not present

## 2015-05-12 DIAGNOSIS — Z0000 Encounter for general adult medical examination without abnormal findings: Secondary | ICD-10-CM | POA: Diagnosis not present

## 2015-05-12 DIAGNOSIS — I10 Essential (primary) hypertension: Secondary | ICD-10-CM

## 2015-05-12 DIAGNOSIS — N3945 Continuous leakage: Secondary | ICD-10-CM

## 2015-05-12 DIAGNOSIS — Z1211 Encounter for screening for malignant neoplasm of colon: Secondary | ICD-10-CM | POA: Diagnosis not present

## 2015-05-12 DIAGNOSIS — G8929 Other chronic pain: Secondary | ICD-10-CM | POA: Insufficient documentation

## 2015-05-12 DIAGNOSIS — F418 Other specified anxiety disorders: Secondary | ICD-10-CM

## 2015-05-12 DIAGNOSIS — F32A Depression, unspecified: Secondary | ICD-10-CM

## 2015-05-12 DIAGNOSIS — E785 Hyperlipidemia, unspecified: Secondary | ICD-10-CM

## 2015-05-12 DIAGNOSIS — C4491 Basal cell carcinoma of skin, unspecified: Secondary | ICD-10-CM

## 2015-05-12 LAB — HEMOCCULT GUIAC POC 1CARD (OFFICE): FECAL OCCULT BLD: NEGATIVE

## 2015-05-12 MED ORDER — MIRABEGRON ER 25 MG PO TB24: 25.0000 mg | ORAL_TABLET | Freq: Every day | ORAL | Status: DC

## 2015-05-12 NOTE — Patient Instructions (Addendum)
Annual wellness in October, call if you need me sooner  New medication sent for incontinence, call back if not helpful   Fasting lipid,cBC, vit D  Thank you  for choosing [facility]. We consider it a privelige to serve you.  Delivering excellent health care in a caring and  compassionate way is our goal.  Partnering with you,  so that together we can achieve this goal is our strategy.
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
## 2015-05-13 ENCOUNTER — Other Ambulatory Visit: Payer: Self-pay

## 2015-05-13 MED ORDER — CITALOPRAM HYDROBROMIDE 40 MG PO TABS: 40.0000 mg | ORAL_TABLET | Freq: Every day | ORAL | Status: DC

## 2015-05-13 MED ORDER — GABAPENTIN 100 MG PO CAPS: ORAL_CAPSULE | ORAL | Status: DC

## 2015-05-13 MED ORDER — ROSUVASTATIN CALCIUM 40 MG PO TABS: ORAL_TABLET | ORAL | Status: DC

## 2015-05-13 MED ORDER — RANITIDINE HCL 150 MG PO CAPS: 150.0000 mg | ORAL_CAPSULE | Freq: Every evening | ORAL | Status: DC

## 2015-05-14 NOTE — Assessment & Plan Note (Addendum)
New area of concern on right nostril noted by patient, and she has multiple irregular and rough and variably pigmented lesions on her back, derm to re evaluate

## 2015-05-14 NOTE — Assessment & Plan Note (Signed)
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
## 2015-05-14 NOTE — Assessment & Plan Note (Signed)
Uncontrolled, no response to medication prescribed, will try alternative med, needs to wear incontinence supllies at all times

## 2015-05-23 DIAGNOSIS — C44319 Basal cell carcinoma of skin of other parts of face: Secondary | ICD-10-CM | POA: Diagnosis not present

## 2015-05-23 DIAGNOSIS — L820 Inflamed seborrheic keratosis: Secondary | ICD-10-CM | POA: Diagnosis not present

## 2015-05-23 DIAGNOSIS — D225 Melanocytic nevi of trunk: Secondary | ICD-10-CM | POA: Diagnosis not present

## 2015-05-23 DIAGNOSIS — C44311 Basal cell carcinoma of skin of nose: Secondary | ICD-10-CM | POA: Diagnosis not present

## 2015-05-26 ENCOUNTER — Other Ambulatory Visit: Payer: Self-pay | Admitting: Family Medicine

## 2015-05-26 ENCOUNTER — Ambulatory Visit (INDEPENDENT_AMBULATORY_CARE_PROVIDER_SITE_OTHER): Payer: Medicare HMO | Admitting: Otolaryngology

## 2015-05-27 ENCOUNTER — Other Ambulatory Visit: Payer: Self-pay

## 2015-05-27 MED ORDER — OXYCODONE-ACETAMINOPHEN 10-325 MG PO TABS: ORAL_TABLET | ORAL | Status: DC

## 2015-06-03 ENCOUNTER — Other Ambulatory Visit: Payer: Self-pay | Admitting: "Endocrinology

## 2015-06-03 ENCOUNTER — Other Ambulatory Visit: Payer: Self-pay | Admitting: Family Medicine

## 2015-06-03 DIAGNOSIS — E039 Hypothyroidism, unspecified: Secondary | ICD-10-CM | POA: Diagnosis not present

## 2015-06-03 DIAGNOSIS — E1121 Type 2 diabetes mellitus with diabetic nephropathy: Secondary | ICD-10-CM | POA: Diagnosis not present

## 2015-06-03 LAB — BASIC METABOLIC PANEL
BUN: 32 mg/dL — ABNORMAL HIGH (ref 7–25)
CALCIUM: 9.2 mg/dL (ref 8.6–10.4)
CHLORIDE: 104 mmol/L (ref 98–110)
CO2: 28 mmol/L (ref 20–31)
CREATININE: 1.43 mg/dL — AB (ref 0.60–0.88)
GLUCOSE: 134 mg/dL — AB (ref 65–99)
Potassium: 5.2 mmol/L (ref 3.5–5.3)
Sodium: 142 mmol/L (ref 135–146)

## 2015-06-03 LAB — HEMOGLOBIN A1C
Hgb A1c MFr Bld: 7.5 % — ABNORMAL HIGH (ref <5.7)
Mean Plasma Glucose: 169 mg/dL

## 2015-06-03 LAB — TSH: TSH: 2.36 mIU/L

## 2015-06-03 LAB — T4, FREE: Free T4: 1.3 ng/dL (ref 0.8–1.8)

## 2015-06-08 ENCOUNTER — Ambulatory Visit: Payer: Commercial Managed Care - HMO | Admitting: "Endocrinology

## 2015-06-09 DIAGNOSIS — C44519 Basal cell carcinoma of skin of other part of trunk: Secondary | ICD-10-CM | POA: Diagnosis not present

## 2015-06-15 ENCOUNTER — Ambulatory Visit (INDEPENDENT_AMBULATORY_CARE_PROVIDER_SITE_OTHER): Payer: Medicare HMO | Admitting: "Endocrinology

## 2015-06-15 ENCOUNTER — Encounter: Payer: Self-pay | Admitting: "Endocrinology

## 2015-06-15 VITALS — BP 144/70 | HR 57 | Ht 62.0 in | Wt 172.0 lb

## 2015-06-15 DIAGNOSIS — E785 Hyperlipidemia, unspecified: Secondary | ICD-10-CM

## 2015-06-15 DIAGNOSIS — E1121 Type 2 diabetes mellitus with diabetic nephropathy: Secondary | ICD-10-CM

## 2015-06-15 DIAGNOSIS — I10 Essential (primary) hypertension: Secondary | ICD-10-CM

## 2015-06-15 DIAGNOSIS — E039 Hypothyroidism, unspecified: Secondary | ICD-10-CM | POA: Diagnosis not present

## 2015-06-15 NOTE — Progress Notes (Signed)
Subjective:    Patient ID: [name] [name], female    DOB: 06/21/1929, PCP Tula Nakayama, MD   Past Medical History  Diagnosis Date  . Atrial flutter ([place]) febuary 2008       atrial flutter... DC Cardioversion...successful... holding sinus as of january 2011 coumadin therapy...discontinued...severe hematoma secondary to fall  . Hypertension   . Hyperlipidemia   . Hypercholesterolemia   . Mitral regurgitation 2010    mild ...echo...2010  . Obesity   . Sleep apnea   . Fatigue   . Depression with anxiety   . Visual loss     unspecified  . Hematochezia   . Hematoma     ...buttocks secondary to fall november 2009 with coumadin  coumadin stopped  . Diabetes mellitus     type 2  . Back pain     with radiculopathy  . Arthritis   . Osteoarthritis   . Acute cystitis   . GERD (gastroesophageal reflux disease)   . Diverticulosis of colon   . Constipation   . IBS (irritable bowel syndrome)   . Abdominal bloating   . Nausea   . Erosive esophagitis   . Aortic valve sclerosis     Ef 60 %...echo...july 2010   . Chronic granulomatous disease ([place])     right chest ... ct chest... novmber 2009  . Normal nuclear stress test     september ,2007...no ischemia  . Anticoagulant long-term use     Coumadin  stopped...severe hematoma  from fall  . Edema     Painful, April, 2012  . Ejection fraction     EF 60-65%, echo, April 13, 2010, normal RV function, mild mitral regurgitation  . Carotid artery disease ([place])     Doppler, [facility], February 21, 2012, less than 50% bilateral carotid stenoses  . Basal cell carcinoma 01/2015    nose   Past Surgical History  Procedure Laterality Date  . Bilateral bunion removel  [id number]  . Replacement total knee bilateral      2000,2001  . Tonsillectomy  1957  . Appendectomy  1948  . Dilation and curettage of uterus  1952 and 2001  . Abdominal hysterectomy  2001  . Cataract extraction  2001    right   . Other surgical history    .  Cataract extraction      left 2005  . Basal cell carcinoma excision  09/2009    right forarm and back of neck and left side   . Colonoscopy   07/17/2006    Dr. Rourk:anal canal hemorrhoids, diminutive rectal polyp status post cold biopsy.  Otherwise, normal rectum. Left sided and  transverse diverticula.  Diminutive polyp in the ascending colon, cold bx. hyperplastic polyps.   . Esophagogastroduodenoscopy   07/17/2006    Dr. Rourk:single 2 cm distal esophageal erosion consistent with erosive reflux esophagitis, otherwise normal  . Esophagogastroduodenoscopy N/A 09/10/2013    Dr. Rourk:Schatzki's ring - status post Venia Minks dilation. Mild erosive reflux esophagitis. Hiatal hernia with fosamax capsule lodged as above  . Savory dilation N/A 09/10/2013    Procedure: SAVORY DILATION;  Surgeon: Daneil Dolin, MD;  Location: [facility];  Service: Endoscopy;  Laterality: N/A;  Venia Minks dilation N/A 09/10/2013    Procedure: Venia Minks DILATION;  Surgeon: Daneil Dolin, MD;  Location: [facility];  Service: Endoscopy;  Laterality: N/A;   Social History   Social History  . Marital Status: Married    Spouse Name: N/A  .  Number of Children: N/A  . Years of Education: 13   Occupational History  . retired    Social History Main Topics  . Smoking status: Never Smoker   . Smokeless tobacco: Never Used  . Alcohol Use: No  . Drug Use: No  . Sexual Activity: Not Currently   Other Topics Concern  . Not on file   Social History Narrative   Outpatient Encounter Prescriptions as of 06/15/2015  Medication Sig  . aspirin 81 MG tablet Take 81 mg by mouth daily.    . benazepril-hydrochlorthiazide (LOTENSIN HCT) 20-12.5 MG tablet Take 2 tablets by mouth daily.  . calcium carbonate (OSCAL) 1500 (600 CA) MG TABS tablet Take 2 tablets by mouth 1 day or 1 dose.  . citalopram (CELEXA) 40 MG tablet Take 1 tablet (40 mg total) by mouth daily.  Marland Kitchen diltiazem (DILT-XR) 240 MG 24 hr capsule TAKE 1 CAPSULE BY MOUTH  DAILY.  . ergocalciferol (VITAMIN D2) 50000 units capsule Take 50,000 Units by mouth once a week.  . fenofibrate (TRICOR) 145 MG tablet Take 1 tablet (145 mg total) by mouth at bedtime.  . fish oil-omega-3 fatty acids 1000 MG capsule Take 1 g by mouth daily. 2 caps bid   . fluticasone (FLONASE) 50 MCG/ACT nasal spray USE TWO SPRAY(S) IN EACH NOSTRIL ONCE DAILY  . furosemide (LASIX) 40 MG tablet Take 1 tablet (40 mg total) by mouth daily.  Marland Kitchen gabapentin (NEURONTIN) 100 MG capsule TAKE 2 CAPSULES BY MOUTH THREE TIMES DAILY (DOSE INCREASE)  . glucose blood (ACCU-CHEK AVIVA PLUS) test strip Use as instructed 3 x daily. Dx E11.65  . L-Methylfolate-Algae-B12-B6 (METANX) 3-90.314-2-35 MG CAPS Take 1 capsule by mouth daily.  Marland Kitchen levothyroxine (SYNTHROID, LEVOTHROID) 50 MCG tablet Take 1 tablet (50 mcg total) by mouth daily.  . Linaclotide (LINZESS) 145 MCG CAPS capsule Take 145 mcg by mouth daily as needed.  . mirabegron ER (MYRBETRIQ) 25 MG TB24 tablet Take 1 tablet (25 mg total) by mouth daily.  . Multiple Vitamin (MULTIVITAMIN) tablet Take 1 tablet by mouth daily.  Marland Kitchen NOVOLIN 70/30 RELION (70-30) 100 UNIT/ML injection INJECT SUBCUTANEOUSLY 20 UNITS TWICE DAILY WITH A MEAL  . oxyCODONE-acetaminophen (PERCOCET) 10-325 MG tablet One tablet four times daily for severe uncontrolled pain  . pantoprazole (PROTONIX) 40 MG tablet Take 1 tablet (40 mg total) by mouth daily.  . ranitidine (ZANTAC) 150 MG capsule Take 1 capsule (150 mg total) by mouth every evening.  . rosuvastatin (CRESTOR) 40 MG tablet TAKE 1 TABLET BY MOUTH EVERY DAILY  . [DISCONTINUED] ranitidine (ZANTAC) 150 MG tablet TAKE ONE TABLET BY MOUTH AT BEDTIME   No facility-administered encounter medications on file as of 06/15/2015.   ALLERGIES: Allergies  Allergen Reactions  . Lisinopril Nausea And Vomiting  . Reclast [Zoledronic Acid] Other (See Comments)    Jaw pain, and possible deterioration in kidney function  . Alprazolam Other (See  Comments)    hyper  . Losartan Other (See Comments)    Raw throat  . Sertraline Hcl Diarrhea  . Sulfonamide Derivatives Rash   VACCINATION STATUS: Immunization History  Administered Date(s) Administered  . H1N1 11/04/2007  . Influenza Split 10/12/2010, 09/28/2011  . Influenza Whole 10/25/2005, 11/01/2008, 10/04/2009  . Influenza,inj,Quad PF,36+ Mos 11/10/2012, 10/29/2013, 09/28/2014  . Pneumococcal Conjugate-13 05/04/2014  . Pneumococcal Polysaccharide-23 05/27/2003  . Td 10/04/2009  . Zoster 03/25/2006    Diabetes She presents for her follow-up diabetic visit. She has type 2 diabetes mellitus. Onset time: She was diagnosed  at approximate age of 21 years. Her disease course has been improving. There are no hypoglycemic associated symptoms. Pertinent negatives for hypoglycemia include no confusion, headaches, pallor or seizures. Pertinent negatives for diabetes include no chest pain, no polydipsia, no polyphagia and no polyuria. There are no hypoglycemic complications. Symptoms are improving. Diabetic complications include nephropathy. Risk factors for coronary artery disease include diabetes mellitus, dyslipidemia, hypertension and sedentary lifestyle. When asked about current treatments, none were reported. She is compliant with treatment some of the time. Her weight is increasing steadily. She is following a generally unhealthy diet. She has not had a previous visit with a dietitian. She never participates in exercise. Her breakfast blood glucose range is generally 140-180 mg/dl. Her dinner blood glucose range is generally 140-180 mg/dl. Her overall blood glucose range is 140-180 mg/dl. An ACE inhibitor/angiotensin II receptor blocker is being taken. Eye exam is current.  Thyroid Problem Presents for initial visit. Onset time: she is being diagnosed with primary hypothyroidism today. Patient reports no cold intolerance, diarrhea, heat intolerance or palpitations. Past treatments include  nothing. The following procedures have not been performed: radioiodine uptake scan, thyroid FNA, thyroid ultrasound and thyroidectomy. Her past medical history is significant for hyperlipidemia.  Hyperlipidemia This is a chronic problem. The current episode started more than 1 year ago. Pertinent negatives include no chest pain, myalgias or shortness of breath. Current antihyperlipidemic treatment includes statins.  Hypertension This is a chronic problem. The current episode started more than 1 year ago. The problem is controlled. Pertinent negatives include no chest pain, headaches, palpitations or shortness of breath. Past treatments include ACE inhibitors. Hypertensive end-organ damage includes a thyroid problem.     Review of Systems  Constitutional: Negative for unexpected weight change.  HENT: Negative for trouble swallowing and voice change.   Eyes: Negative for visual disturbance.  Respiratory: Negative for cough, shortness of breath and wheezing.   Cardiovascular: Negative for chest pain, palpitations and leg swelling.  Gastrointestinal: Negative for nausea, vomiting and diarrhea.  Endocrine: Negative for cold intolerance, heat intolerance, polydipsia, polyphagia and polyuria.  Musculoskeletal: Positive for back pain and arthralgias. Negative for myalgias.  Skin: Negative for color change, pallor, rash and wound.  Neurological: Negative for seizures and headaches.  Psychiatric/Behavioral: Negative for suicidal ideas and confusion.    Objective:    BP 144/70 mmHg  Pulse 57  Ht 5\' 2"  (1.575 m)  Wt 172 lb (78.019 kg)  BMI 31.45 kg/m2  Wt Readings from Last 3 Encounters:  06/15/15 172 lb (78.019 kg)  05/12/15 168 lb (76.204 kg)  03/03/15 165 lb (74.844 kg)    Physical Exam  Constitutional: She is oriented to person, place, and time. She appears well-developed.  Walks with her walker.  HENT:  Head: Normocephalic and atraumatic.  Eyes: EOM are normal.  Neck: Normal range of  motion. Neck supple. No tracheal deviation present. No thyromegaly present.  Cardiovascular: Normal rate and regular rhythm.   Pulmonary/Chest: Effort normal and breath sounds normal.  Abdominal: Soft. Bowel sounds are normal. There is no tenderness. There is no guarding.  Musculoskeletal: She exhibits no edema.  Walks with her walker.  Neurological: She is alert and oriented to person, place, and time. She has normal reflexes. No cranial nerve deficit. Coordination normal.  Skin: Skin is warm and dry. No rash noted. No erythema. No pallor.  Psychiatric: She has a normal mood and affect. Judgment normal.    Results for orders placed or performed in visit on 06/03/15  Basic metabolic panel  Result Value Ref Range   Sodium 142 135 - 146 mmol/L   Potassium 5.2 3.5 - 5.3 mmol/L   Chloride 104 98 - 110 mmol/L   CO2 28 20 - 31 mmol/L   Glucose, Bld 134 (H) 65 - 99 mg/dL   BUN 32 (H) 7 - 25 mg/dL   Creat 1.43 (H) 0.60 - 0.88 mg/dL   Calcium 9.2 8.6 - 10.4 mg/dL  Hemoglobin A1c  Result Value Ref Range   Hgb A1c MFr Bld 7.5 (H) <5.7 %   Mean Plasma Glucose 169 mg/dL   Complete Blood Count (Most recent): Lab Results  Component Value Date   WBC 6.0 07/08/2014   HGB 11.3* 07/08/2014   HCT 34.2* 07/08/2014   MCV 93.2 07/08/2014   PLT 251 07/08/2014   Chemistry (most recent): Lab Results  Component Value Date   NA 142 06/03/2015   K 5.2 06/03/2015   CL 104 06/03/2015   CO2 28 06/03/2015   BUN 32* 06/03/2015   CREATININE 1.43* 06/03/2015   Diabetic Labs (most recent): Lab Results  Component Value Date   HGBA1C 7.5* 06/03/2015   HGBA1C 7.3* 02/24/2015   HGBA1C 9.5* 11/18/2014   Lipid Panel     Component Value Date/Time   CHOL 158 08/14/2014 0804   TRIG 143 08/14/2014 0804   HDL 70 08/14/2014 0804   CHOLHDL 2.3 08/14/2014 0804   VLDL 29 08/14/2014 0804   LDLCALC 59 08/14/2014 0804     Assessment & Plan:   1. Diabetes mellitus with nephropathy ([place])  - Her diabetes  is complicated by CKD, and she  remains at a high risk for more acute and chronic complications of diabetes which include CAD, CVA, CKD, retinopathy, and neuropathy. These are all discussed in detail with the patient.  Patient came with controlled glucose profile, and  recent A1c is stable at 7.5%, generally improving from  9.5 %.  Her blood glucose readings will be scanned into her medical records.  Recent labs reviewed.   - I have re-counseled the patient on diet management   by adopting a carbohydrate restricted / protein rich  Diet.  - Suggestion is made for patient to avoid simple carbohydrates   from their diet including Cakes , Desserts, Ice Cream,  Soda (  diet and regular) , Sweet Tea , Candies,  Chips, Cookies, Artificial Sweeteners,   and "Sugar-free" Products .  This will help patient to have stable blood glucose profile and potentially avoid unintended  Weight gain.  - Patient is advised to stick to a routine mealtimes to eat 3 meals  a day and avoid unnecessary snacks ( to snack only to correct hypoglycemia).  - I have approached patient with the following individualized plan to manage diabetes and patient agrees.  - Given the long duration of her diabetes, she will continue to need insulin therapy, she did reasonably well on premixed Novolin 70/30. - I will continue  Novolin 70/30 15 units AC BRKFST and supper, associated with strict monitoring of glucose  AC and HS. - Patient is warned not to take insulin without proper monitoring per orders. -Adjustment parameters are given for hypo and hyperglycemia in writing. -Patient is encouraged to call clinic for blood glucose levels less than 70 or above 300 mg /dl.  -Patient is not a candidate for MTF, SGLT2 i  due to CKD.  - Patient specific target  for A1c; LDL, HDL, Triglycerides, and  Waist Circumference were discussed in  detail.  2) BP/HTN: controlled. Continue current medications including ACEI/ARB. 3) Lipids/HPL:  continue  statins. 4)  Weight/Diet: CDE consult in progress, exercise, and carbohydrates information provided.  5) hypothyroidism: Her labs are consistent with appropriate replacement. I will continue levothyroxine 50 g by mouth every morning.  - We discussed about correct intake of levothyroxine, at fasting, with water, separated by at least 30 minutes from breakfast, and separated by more than 4 hours from calcium, iron, multivitamins, acid reflux medications (PPIs). -Patient is made aware of the fact that thyroid hormone replacement is needed for life, dose to be adjusted by periodic monitoring of thyroid function tests.  6) Chronic Care/Health Maintenance:  -Patient is on ACEI/ARB and Statin medications and encouraged to continue to follow up with Ophthalmology, Podiatrist at least yearly or according to recommendations, and advised to  stay away from smoking. I have recommended yearly flu vaccine and pneumonia vaccination at least every 5 years; moderate intensity exercise for up to 150 minutes weekly; and  sleep for at least 7 hours a day.  - 25 minutes of time was spent on the care of this patient , 50% of which was applied for counseling on diabetes complications and their preventions.  - I advised patient to maintain close follow up with Tula Nakayama, MD for primary care needs.  Patient is asked to bring meter and  blood glucose logs during their next visit.   Follow up plan: -Return in about 3 months (around 09/15/2015) for diabetes, high blood pressure, high cholesterol, underactive thyroid, follow up with pre-visit labs, meter, and logs.  Glade Lloyd, MD Phone: [phone]  Fax: [phone]   06/15/2015, 2:28 PM

## 2015-06-23 DIAGNOSIS — R69 Illness, unspecified: Secondary | ICD-10-CM | POA: Diagnosis not present

## 2015-06-29 ENCOUNTER — Other Ambulatory Visit: Payer: Self-pay | Admitting: "Endocrinology

## 2015-07-04 ENCOUNTER — Other Ambulatory Visit: Payer: Self-pay

## 2015-07-04 MED ORDER — OXYCODONE-ACETAMINOPHEN 10-325 MG PO TABS: ORAL_TABLET | ORAL | Status: DC

## 2015-07-14 ENCOUNTER — Encounter: Payer: Self-pay | Admitting: Family Medicine

## 2015-07-14 ENCOUNTER — Ambulatory Visit (INDEPENDENT_AMBULATORY_CARE_PROVIDER_SITE_OTHER): Payer: Medicare HMO | Admitting: Family Medicine

## 2015-07-14 VITALS — BP 128/72 | HR 66 | Resp 16 | Ht 62.0 in | Wt 172.0 lb

## 2015-07-14 DIAGNOSIS — I10 Essential (primary) hypertension: Secondary | ICD-10-CM

## 2015-07-14 DIAGNOSIS — M25512 Pain in left shoulder: Secondary | ICD-10-CM

## 2015-07-14 DIAGNOSIS — E1121 Type 2 diabetes mellitus with diabetic nephropathy: Secondary | ICD-10-CM | POA: Diagnosis not present

## 2015-07-14 DIAGNOSIS — M25571 Pain in right ankle and joints of right foot: Secondary | ICD-10-CM | POA: Diagnosis not present

## 2015-07-14 NOTE — Patient Instructions (Signed)
Annual wellness as before, call if you need me sooner  You are referred to Dr Aline Brochure re right ankle and left shoulder pain  Blood pressure and blood pressure are good  Please work on good  health habits so that your health will improve. 1. Commitment to daily physical activity for 30 to 60  minutes, if you are able to do this.  2. Commitment to wise food choices. Aim for half of your  food intake to be vegetable and fruit, one quarter starchy foods, and one quarter protein. Try to eat on a regular schedule  3 meals per day, snacking between meals should be limited to vegetables or fruits or small portions of nuts. 64 ounces of water per day is generally recommended, unless you have specific health conditions, like heart failure or kidney failure where you will need to limit fluid intake.  3. Commitment to sufficient and a  good quality of physical and mental rest daily, generally between 6 to 8 hours per day.  WITH PERSISTANCE AND PERSEVERANCE, THE IMPOSSIBLE , BECOMES THE NORM! Thank you  for choosing [facility]. We consider it a privelige to serve you.  Delivering excellent health care in a caring and  compassionate way is our goal.  Partnering with you,  so that together we can achieve this goal is our strategy.

## 2015-07-14 NOTE — Assessment & Plan Note (Signed)
Controlled, no change in medication DASH diet and commitment to daily physical activity for a minimum of 30 minutes discussed and encouraged, as a part of hypertension management. The importance of attaining a healthy weight is also discussed.  BP/Weight 07/14/2015 06/15/2015 05/12/2015 03/03/2015 02/01/2015 01/31/2015 Q000111Q  Systolic BP 0000000 [id number] 0000000 0000000 Q000111Q  Diastolic BP 72 70 72 85 72 67 72  Wt. (Lbs) 172 172 168 165 167 165.6 166  BMI 31.45 31.45 30.72 30.17 30.54 30.28 30.35

## 2015-07-14 NOTE — Assessment & Plan Note (Signed)
4 month h/o uncontrolled left shoulder pain and reduced mobility, refer to ortho

## 2015-07-14 NOTE — Assessment & Plan Note (Addendum)
4 month h/o uncontrolled right ankle pain, and difficulty weight bearing, no known trauma, refer to ortho

## 2015-07-15 ENCOUNTER — Other Ambulatory Visit: Payer: Self-pay

## 2015-07-15 ENCOUNTER — Telehealth: Payer: Self-pay | Admitting: Family Medicine

## 2015-07-15 MED ORDER — ERGOCALCIFEROL 1.25 MG (50000 UT) PO CAPS: 50000.0000 [IU] | ORAL_CAPSULE | ORAL | Status: DC

## 2015-07-15 NOTE — Telephone Encounter (Signed)
[name] [name] is asking for a refill on the Vitamin that she takes by mouth 1 time a week, please advise?

## 2015-07-15 NOTE — Telephone Encounter (Signed)
Med refilled.

## 2015-07-17 NOTE — Assessment & Plan Note (Signed)
Controlled, no change in management, treated by endo Ms. Hanawalt is reminded of the importance of commitment to daily physical activity for 30 minutes or more, as able and the need to limit carbohydrate intake to 30 to 60 grams per meal to help with blood sugar control.   The need to take medication as prescribed, test blood sugar as directed, and to call between visits if there is a concern that blood sugar is uncontrolled is also discussed.   Ms. Spurlock is reminded of the importance of daily foot exam, annual eye examination, and good blood sugar, blood pressure and cholesterol control.  Diabetic Labs Latest Ref Rng 06/03/2015 02/24/2015 11/18/2014 08/14/2014 07/08/2014  HbA1c <5.7 % 7.5(H) 7.3(H) 9.5(A) - -  Microalbumin <2.0 mg/dL - - - - -  Micro/Creat Ratio 0.0 - 30.0 mg/g - - - - -  Chol 125 - 200 mg/dL - - - 158 -  HDL >=46 mg/dL - - - 70 -  Calc LDL <130 mg/dL - - - 59 -  Triglycerides <150 mg/dL - - - 143 -  Creatinine 0.60 - 0.88 mg/dL 1.43(H) 1.13(H) - 1.35(H) 1.54(H)   BP/Weight 07/14/2015 06/15/2015 05/12/2015 03/03/2015 02/01/2015 01/31/2015 Q000111Q  Systolic BP 0000000 [id number] 0000000 0000000 Q000111Q  Diastolic BP 72 70 72 85 72 67 72  Wt. (Lbs) 172 172 168 165 167 165.6 166  BMI 31.45 31.45 30.72 30.17 30.54 30.28 30.35   Foot/eye exam completion dates Latest Ref Rng 05/12/2015 10/20/2014  Eye Exam No Retinopathy - No Retinopathy  Foot Form Completion - Done -

## 2015-07-17 NOTE — Progress Notes (Signed)
[name] [name]     MRN: [id number]      DOB: August 23, 1929   HPI [name] [name] is her with main c/o increased and disabling left  shoulder and right leg and  ankle pain, limiting function and mobility and negatively impacting her quality of life. No recent inciting trauma Blood sugar is  Doing well, and mental health stable  ROS Denies recent fever or chills. Denies sinus pressure, nasal congestion, ear pain or sore throat. Denies chest congestion, productive cough or wheezing.  Denies depression, anxiety or insomnia. Denies skin break down or rash.   PE  BP 128/72 mmHg  Pulse 66  Resp 16  Ht 5\' 2"  (1.575 m)  Wt 172 lb (78.019 kg)  BMI 31.45 kg/m2  SpO2 96%  Patient alert and oriented and in no cardiopulmonary distress.  HEENT: No facial asymmetry, EOMI,   oropharynx pink and moist.  Neck decrease ROM no JVD, no mass.  Chest: Clear to auscultation bilaterally.  CVS: S1, S2 no murmurs, no S3.Regular rate.  ABD: Soft non tender.   Ext: No edema  MS: markedly decreased  ROM spine,  Left shoulder, and right ankle Skin: Intact, no ulcerations or rash noted.  Psych: Good eye contact, normal affect. Memory intact not anxious or depressed appearing.  CNS: CN 2-12 intact, power,  normal throughout.no focal deficits noted.   Assessment & Plan  Left shoulder pain 4 month h/o uncontrolled left shoulder pain and reduced mobility, refer to ortho  Right ankle pain 4 month h/o uncontrolled right ankle pain, and difficulty weight bearing, no known trauma, refer to ortho  Essential hypertension Controlled, no change in medication DASH diet and commitment to daily physical activity for a minimum of 30 minutes discussed and encouraged, as a part of hypertension management. The importance of attaining a healthy weight is also discussed.  BP/Weight 07/14/2015 06/15/2015 05/12/2015 03/03/2015 02/01/2015 01/31/2015 Q000111Q  Systolic BP 0000000 [id number] 0000000 0000000 Q000111Q  Diastolic BP 72 70 72 85 72  67 72  Wt. (Lbs) 172 172 168 165 167 165.6 166  BMI 31.45 31.45 30.72 30.17 30.54 30.28 30.35        Diabetes mellitus with nephropathy Controlled, no change in management, treated by endo [name] [name] is reminded of the importance of commitment to daily physical activity for 30 minutes or more, as able and the need to limit carbohydrate intake to 30 to 60 grams per meal to help with blood sugar control.   The need to take medication as prescribed, test blood sugar as directed, and to call between visits if there is a concern that blood sugar is uncontrolled is also discussed.   [name] [name] is reminded of the importance of daily foot exam, annual eye examination, and good blood sugar, blood pressure and cholesterol control.  Diabetic Labs Latest Ref Rng 06/03/2015 02/24/2015 11/18/2014 08/14/2014 07/08/2014  HbA1c <5.7 % 7.5(H) 7.3(H) 9.5(A) - -  Microalbumin <2.0 mg/dL - - - - -  Micro/Creat Ratio 0.0 - 30.0 mg/g - - - - -  Chol 125 - 200 mg/dL - - - 158 -  HDL >=46 mg/dL - - - 70 -  Calc LDL <130 mg/dL - - - 59 -  Triglycerides <150 mg/dL - - - 143 -  Creatinine 0.60 - 0.88 mg/dL 1.43(H) 1.13(H) - 1.35(H) 1.54(H)   BP/Weight 07/14/2015 06/15/2015 05/12/2015 03/03/2015 02/01/2015 01/31/2015 Q000111Q  Systolic BP 0000000 [id number] 0000000 0000000 Q000111Q  Diastolic BP 72 70 72  85 72 67 72  Wt. (Lbs) 172 172 168 165 167 165.6 166  BMI 31.45 31.45 30.72 30.17 30.54 30.28 30.35   Foot/eye exam completion dates Latest Ref Rng 05/12/2015 10/20/2014  Eye Exam No Retinopathy - No Retinopathy  Foot Form Completion - Done -

## 2015-07-26 DIAGNOSIS — B351 Tinea unguium: Secondary | ICD-10-CM | POA: Diagnosis not present

## 2015-07-26 DIAGNOSIS — L84 Corns and callosities: Secondary | ICD-10-CM | POA: Diagnosis not present

## 2015-07-26 DIAGNOSIS — E1142 Type 2 diabetes mellitus with diabetic polyneuropathy: Secondary | ICD-10-CM | POA: Diagnosis not present

## 2015-08-01 ENCOUNTER — Encounter: Payer: Self-pay | Admitting: Gastroenterology

## 2015-08-01 ENCOUNTER — Ambulatory Visit (INDEPENDENT_AMBULATORY_CARE_PROVIDER_SITE_OTHER): Payer: Medicare HMO | Admitting: Gastroenterology

## 2015-08-01 DIAGNOSIS — K5900 Constipation, unspecified: Secondary | ICD-10-CM | POA: Diagnosis not present

## 2015-08-01 NOTE — Patient Instructions (Signed)
I have given you samples of Linzess 72 mcg (a smaller dosage than what you had before). You can try these to see if you like it better.   I will see you in 1 year or sooner if needed!  Please call if you need anything.  I hope you start feeling better!

## 2015-08-01 NOTE — Assessment & Plan Note (Signed)
Much improved since last visit. Will provide Linzess 72 mcg samples just to have on hand as needed. Return in 1 year or sooner if needed. From a GI perspective, she is doing quite well.

## 2015-08-01 NOTE — Progress Notes (Signed)
Referring Provider: Fayrene Helper, MD Primary Care Physician:  Tula Nakayama, MD Primary GI: Dr. Gala Romney   Chief Complaint  Patient presents with  . Follow-up    constipation is better    HPI:   [name] [name] is a 82 y.o. female presenting today with a history of dysphagia and early satiety. EGD Sept 2015 with Schatzki's ring s/p dilation. Chronic constipation. Linzess 145 too strong at last visit in Jan 2017. Here for 6 month follow-up.   Chronic back pain. Sees Dr. Aline Brochure soon. Takes Linzes 145 mcg only "as needed". Even then, it is "too strong". Would like to trial 72 mcg, which were not available at time of last appt. BMs "take care of themselves" after coffee usually. Has occasional spells where she swallows wrong, mainly with fruit ,but "not enough to notice". No abdominal pain. From a GI perspective feels she is doing well.   Past Medical History:  Diagnosis Date  . Abdominal bloating   . Acute cystitis   . Anticoagulant long-term use    Coumadin  stopped...severe hematoma  from fall  . Aortic valve sclerosis    Ef 60 %...echo...july 2010   . Arthritis   . Atrial flutter ([place]) febuary 2008      atrial flutter... DC Cardioversion...successful... holding sinus as of january 2011 coumadin therapy...discontinued...severe hematoma secondary to fall  . Back pain    with radiculopathy  . Basal cell carcinoma 01/2015   nose  . Carotid artery disease ([place])    Doppler, [facility], February 21, 2012, less than 50% bilateral carotid stenoses  . Chronic granulomatous disease ([place])    right chest ... ct chest... novmber 2009  . Constipation   . Depression with anxiety   . Diabetes mellitus    type 2  . Diverticulosis of colon   . Edema    Painful, April, 2012  . Ejection fraction    EF 60-65%, echo, April 13, 2010, normal RV function, mild mitral regurgitation  . Erosive esophagitis   . Fatigue   . GERD (gastroesophageal reflux disease)   . Hematochezia     . Hematoma    ...buttocks secondary to fall november 2009 with coumadin  coumadin stopped  . Hypercholesterolemia   . Hyperlipidemia   . Hypertension   . IBS (irritable bowel syndrome)   . Mitral regurgitation 2010   mild ...echo...2010  . Nausea   . Normal nuclear stress test    september ,2007...no ischemia  . Obesity   . Osteoarthritis   . Sleep apnea   . Visual loss    unspecified    Past Surgical History:  Procedure Laterality Date  . ABDOMINAL HYSTERECTOMY  2001  . APPENDECTOMY  1948  . BASAL CELL CARCINOMA EXCISION  09/2009   right forarm and back of neck and left side   . bilateral bunion removel  [id number]  . CATARACT EXTRACTION  2001   right   . CATARACT EXTRACTION     left 2005  . COLONOSCOPY   07/17/2006   Dr. Rourk:anal canal hemorrhoids, diminutive rectal polyp status post cold biopsy.  Otherwise, normal rectum. Left sided and  transverse diverticula.  Diminutive polyp in the ascending colon, cold bx. hyperplastic polyps.   Marland Kitchen [place] and 2001  . ESOPHAGOGASTRODUODENOSCOPY   07/17/2006   Dr. Rourk:single 2 cm distal esophageal erosion consistent with erosive reflux esophagitis, otherwise normal  . ESOPHAGOGASTRODUODENOSCOPY N/A 09/10/2013   Dr. Rourk:Schatzki's ring -  status post [facility] dilation. Mild erosive reflux esophagitis. Hiatal hernia with fosamax capsule lodged as above  . MALONEY DILATION N/A 09/10/2013   Procedure: Venia Minks DILATION;  Surgeon: Daneil Dolin, MD;  [facility];  Service: Endoscopy;  Laterality: N/A;  . OTHER SURGICAL HISTORY    . REPLACEMENT TOTAL KNEE BILATERAL     2000,2001  . SAVORY DILATION N/A 09/10/2013   Procedure: SAVORY DILATION;  Surgeon: Daneil Dolin, MD;  [facility];  Service: Endoscopy;  Laterality: N/A;  . TONSILLECTOMY  1957    Current Outpatient Prescriptions  Medication Sig Dispense Refill  . aspirin 81 MG tablet Take 81 mg by mouth daily.      .  benazepril-hydrochlorthiazide (LOTENSIN HCT) 20-12.5 MG tablet Take 2 tablets by mouth daily. 180 tablet 1  . calcium carbonate (OSCAL) 1500 (600 CA) MG TABS tablet Take 2 tablets by mouth 1 day or 1 dose.    . citalopram (CELEXA) 40 MG tablet Take 1 tablet (40 mg total) by mouth daily. 30 tablet 5  . diltiazem (DILT-XR) 240 MG 24 hr capsule TAKE 1 CAPSULE BY MOUTH DAILY. 90 capsule 1  . docusate sodium (COLACE) 100 MG capsule Take 100 mg by mouth daily.    . fenofibrate (TRICOR) 145 MG tablet Take 1 tablet (145 mg total) by mouth at bedtime. 90 tablet 1  . fish oil-omega-3 fatty acids 1000 MG capsule Take 1 g by mouth daily. 2 caps bid     . fluticasone (FLONASE) 50 MCG/ACT nasal spray USE TWO SPRAY(S) IN EACH NOSTRIL ONCE DAILY 16 g 4  . furosemide (LASIX) 40 MG tablet Take 1 tablet (40 mg total) by mouth daily. (Patient taking differently: Take 40 mg by mouth daily. As needed) 30 tablet 4  . gabapentin (NEURONTIN) 100 MG capsule TAKE 2 CAPSULES BY MOUTH THREE TIMES DAILY (DOSE INCREASE) 180 capsule 5  . L-Methylfolate-Algae-B12-B6 (METANX) 3-90.314-2-35 MG CAPS Take 1 capsule by mouth daily. 30 capsule 4  . levothyroxine (SYNTHROID, LEVOTHROID) 50 MCG tablet TAKE ONE TABLET BY MOUTH ONCE DAILY 30 tablet 3  . Linaclotide (LINZESS) 145 MCG CAPS capsule Take 145 mcg by mouth daily as needed.    . Multiple Vitamin (MULTIVITAMIN) tablet Take 1 tablet by mouth daily.    Marland Kitchen NOVOLIN 70/30 RELION (70-30) 100 UNIT/ML injection INJECT SUBCUTANEOUSLY 20 UNITS TWICE DAILY WITH A MEAL (Patient taking differently: INJECT SUBCUTANEOUSLY 15  UNITS TWICE DAILY WITH A MEAL) 20 mL 2  . oxyCODONE-acetaminophen (PERCOCET) 10-325 MG tablet One tablet four times daily for severe uncontrolled pain 120 tablet 0  . pantoprazole (PROTONIX) 40 MG tablet Take 1 tablet (40 mg total) by mouth daily. 30 tablet 11  . rosuvastatin (CRESTOR) 40 MG tablet TAKE 1 TABLET BY MOUTH EVERY DAILY 30 tablet 5  . glucose blood (ACCU-CHEK  AVIVA PLUS) test strip Use as instructed 3 x daily. Dx E11.65 100 each 5   No current facility-administered medications for this visit.     Allergies as of 08/01/2015 - Review Complete 08/01/2015  Allergen Reaction Noted  . Lisinopril Nausea And Vomiting   . Reclast [zoledronic acid] Other (See Comments) 02/23/2014  . Alprazolam Other (See Comments)   . Losartan Other (See Comments) 04/08/2013  . Sertraline hcl Diarrhea 05/24/2008  . Sulfonamide derivatives Rash     Family History  Problem Relation Age of Onset  . Dementia Mother   . Heart disease Father   . Diabetes    . Arthritis    .  Colon cancer Neg Hx     Social History   Social History  . Marital status: Married    Spouse name: N/A  . Number of children: N/A  . Years of education: 33   Occupational History  . retired Retired   Social History Main Topics  . Smoking status: Never Smoker  . Smokeless tobacco: Never Used  . Alcohol use No  . Drug use: No  . Sexual activity: Not Currently   Other Topics Concern  . None   Social History Narrative  . None    Review of Systems: As mentioned in HPI.   Physical Exam: BP 130/62   Pulse (!) 58   Temp 97 F (36.1 C) (Oral)   Ht 5\' 2"  (1.575 m)   Wt 171 lb 12.8 oz (77.9 kg)   BMI 31.42 kg/m  General:   Alert and oriented. No distress noted. Pleasant and cooperative.  Head:  Normocephalic and atraumatic. Eyes:  Conjuctiva clear without scleral icterus. Mouth:  Oral mucosa pink and moist.  Abdomen:  +BS, soft, non-tender and non-distended. No rebound or guarding. No HSM or masses noted. Msk:  Kyphosis  Extremities:  Without edema. Neurologic:  Alert and  oriented x4 Psych:  Alert and cooperative. Normal mood and affect.

## 2015-08-01 NOTE — Progress Notes (Signed)
CC'ED TO PCP 

## 2015-08-03 ENCOUNTER — Ambulatory Visit (INDEPENDENT_AMBULATORY_CARE_PROVIDER_SITE_OTHER): Payer: Medicare HMO | Admitting: Orthopedic Surgery

## 2015-08-03 ENCOUNTER — Encounter: Payer: Self-pay | Admitting: Orthopedic Surgery

## 2015-08-03 VITALS — BP 101/50 | HR 55 | Ht 62.0 in | Wt 174.0 lb

## 2015-08-03 DIAGNOSIS — M5441 Lumbago with sciatica, right side: Secondary | ICD-10-CM | POA: Diagnosis not present

## 2015-08-03 DIAGNOSIS — M25572 Pain in left ankle and joints of left foot: Secondary | ICD-10-CM | POA: Diagnosis not present

## 2015-08-03 DIAGNOSIS — M25571 Pain in right ankle and joints of right foot: Secondary | ICD-10-CM | POA: Diagnosis not present

## 2015-08-03 NOTE — Patient Instructions (Signed)
Call  to arrange PT

## 2015-08-04 ENCOUNTER — Ambulatory Visit (INDEPENDENT_AMBULATORY_CARE_PROVIDER_SITE_OTHER): Payer: Medicare HMO | Admitting: Otolaryngology

## 2015-08-04 ENCOUNTER — Other Ambulatory Visit: Payer: Self-pay

## 2015-08-04 DIAGNOSIS — R490 Dysphonia: Secondary | ICD-10-CM | POA: Diagnosis not present

## 2015-08-04 DIAGNOSIS — Z85828 Personal history of other malignant neoplasm of skin: Secondary | ICD-10-CM | POA: Diagnosis not present

## 2015-08-04 DIAGNOSIS — R1312 Dysphagia, oropharyngeal phase: Secondary | ICD-10-CM

## 2015-08-04 DIAGNOSIS — Z08 Encounter for follow-up examination after completed treatment for malignant neoplasm: Secondary | ICD-10-CM | POA: Diagnosis not present

## 2015-08-04 DIAGNOSIS — L820 Inflamed seborrheic keratosis: Secondary | ICD-10-CM | POA: Diagnosis not present

## 2015-08-04 MED ORDER — OXYCODONE-ACETAMINOPHEN 10-325 MG PO TABS: ORAL_TABLET | ORAL | 0 refills | Status: DC

## 2015-08-08 DIAGNOSIS — C44319 Basal cell carcinoma of skin of other parts of face: Secondary | ICD-10-CM | POA: Diagnosis not present

## 2015-08-08 NOTE — Progress Notes (Signed)
Chief Complaint  Patient presents with  . New Patient (Initial Visit)    Bilateral ankle pain, referred by Dr. Moshe Cipro.   HPI 82 year old presents with low back pain pain on the right and left leg radiating into the left ankle. No trauma. Pain intensity 10. Timing morning and after activity. Quality stabbing. Other symptoms stiffness tingling. She had an ultrasound to rule out blood clot which was normal. She did have injections in therapy takes gabapentin 3 mg every 8 and oxycodone 10 mg. Nothing really makes it better nothing makes it worse. She had an epidural in March of this year.  Review of Systems  Constitutional: Negative for chills and fever.  Gastrointestinal: Positive for nausea and vomiting.  Genitourinary: Positive for frequency.  Musculoskeletal: Positive for back pain and joint pain.  Psychiatric/Behavioral: Positive for depression.    Past Medical History:  Diagnosis Date  . Abdominal bloating   . Acute cystitis   . Anticoagulant long-term use    Coumadin  stopped...severe hematoma  from fall  . Aortic valve sclerosis    Ef 60 %...echo...july 2010   . Arthritis   . Atrial flutter ([place]) febuary 2008      atrial flutter... DC Cardioversion...successful... holding sinus as of january 2011 coumadin therapy...discontinued...severe hematoma secondary to fall  . Back pain    with radiculopathy  . Basal cell carcinoma 01/2015   nose  . Carotid artery disease ([place])    Doppler, [facility], February 21, 2012, less than 50% bilateral carotid stenoses  . Chronic granulomatous disease ([place])    right chest ... ct chest... novmber 2009  . Constipation   . Depression with anxiety   . Diabetes mellitus    type 2  . Diverticulosis of colon   . Edema    Painful, April, 2012  . Ejection fraction    EF 60-65%, echo, April 13, 2010, normal RV function, mild mitral regurgitation  . Erosive esophagitis   . Fatigue   . GERD (gastroesophageal reflux disease)   . Hematochezia    . Hematoma    ...buttocks secondary to fall november 2009 with coumadin  coumadin stopped  . Hypercholesterolemia   . Hyperlipidemia   . Hypertension   . IBS (irritable bowel syndrome)   . Mitral regurgitation 2010   mild ...echo...2010  . Nausea   . Normal nuclear stress test    september ,2007...no ischemia  . Obesity   . Osteoarthritis   . Sleep apnea   . Visual loss    unspecified    Past Surgical History:  Procedure Laterality Date  . ABDOMINAL HYSTERECTOMY  2001  . APPENDECTOMY  1948  . BASAL CELL CARCINOMA EXCISION  09/2009   right forarm and back of neck and left side   . bilateral bunion removel  [id number]  . CATARACT EXTRACTION  2001   right   . CATARACT EXTRACTION     left 2005  . COLONOSCOPY   07/17/2006   Dr. Rourk:anal canal hemorrhoids, diminutive rectal polyp status post cold biopsy.  Otherwise, normal rectum. Left sided and  transverse diverticula.  Diminutive polyp in the ascending colon, cold bx. hyperplastic polyps.   Marland Kitchen [place] and 2001  . ESOPHAGOGASTRODUODENOSCOPY   07/17/2006   Dr. Rourk:single 2 cm distal esophageal erosion consistent with erosive reflux esophagitis, otherwise normal  . ESOPHAGOGASTRODUODENOSCOPY N/A 09/10/2013   Dr. Rourk:Schatzki's ring - status post Venia Minks dilation. Mild erosive reflux esophagitis. Hiatal hernia with fosamax capsule lodged as  above  . MALONEY DILATION N/A 09/10/2013   Procedure: Venia Minks DILATION;  Surgeon: Daneil Dolin, MD;  [facility];  Service: Endoscopy;  Laterality: N/A;  . OTHER SURGICAL HISTORY    . REPLACEMENT TOTAL KNEE BILATERAL     2000,2001  . SAVORY DILATION N/A 09/10/2013   Procedure: SAVORY DILATION;  Surgeon: Daneil Dolin, MD;  [facility];  Service: Endoscopy;  Laterality: N/A;  . TONSILLECTOMY  1957   Family History  Problem Relation Age of Onset  . Dementia Mother   . Heart disease Father   . Diabetes    . Arthritis    . Colon  cancer Neg Hx    Social History  Substance Use Topics  . Smoking status: Never Smoker  . Smokeless tobacco: Never Used  . Alcohol use No    Current Outpatient Prescriptions:  .  aspirin 81 MG tablet, Take 81 mg by mouth daily.  , Disp: , Rfl:  .  benazepril-hydrochlorthiazide (LOTENSIN HCT) 20-12.5 MG tablet, Take 2 tablets by mouth daily., Disp: 180 tablet, Rfl: 1 .  calcium carbonate (OSCAL) 1500 (600 CA) MG TABS tablet, Take 2 tablets by mouth 1 day or 1 dose., Disp: , Rfl:  .  citalopram (CELEXA) 40 MG tablet, Take 1 tablet (40 mg total) by mouth daily., Disp: 30 tablet, Rfl: 5 .  diltiazem (DILT-XR) 240 MG 24 hr capsule, TAKE 1 CAPSULE BY MOUTH DAILY., Disp: 90 capsule, Rfl: 1 .  docusate sodium (COLACE) 100 MG capsule, Take 100 mg by mouth daily., Disp: , Rfl:  .  fenofibrate (TRICOR) 145 MG tablet, Take 1 tablet (145 mg total) by mouth at bedtime., Disp: 90 tablet, Rfl: 1 .  fish oil-omega-3 fatty acids 1000 MG capsule, Take 1 g by mouth daily. 2 caps bid , Disp: , Rfl:  .  fluticasone (FLONASE) 50 MCG/ACT nasal spray, USE TWO SPRAY(S) IN EACH NOSTRIL ONCE DAILY, Disp: 16 g, Rfl: 4 .  furosemide (LASIX) 40 MG tablet, Take 1 tablet (40 mg total) by mouth daily. (Patient taking differently: Take 40 mg by mouth daily. As needed), Disp: 30 tablet, Rfl: 4 .  gabapentin (NEURONTIN) 100 MG capsule, TAKE 2 CAPSULES BY MOUTH THREE TIMES DAILY (DOSE INCREASE), Disp: 180 capsule, Rfl: 5 .  glucose blood (ACCU-CHEK AVIVA PLUS) test strip, Use as instructed 3 x daily. Dx E11.65, Disp: 100 each, Rfl: 5 .  L-Methylfolate-Algae-B12-B6 (METANX) 3-90.314-2-35 MG CAPS, Take 1 capsule by mouth daily., Disp: 30 capsule, Rfl: 4 .  levothyroxine (SYNTHROID, LEVOTHROID) 50 MCG tablet, TAKE ONE TABLET BY MOUTH ONCE DAILY, Disp: 30 tablet, Rfl: 3 .  Linaclotide (LINZESS) 145 MCG CAPS capsule, Take 145 mcg by mouth daily as needed., Disp: , Rfl:  .  Multiple Vitamin (MULTIVITAMIN) tablet, Take 1 tablet by mouth  daily., Disp: , Rfl:  .  NOVOLIN 70/30 RELION (70-30) 100 UNIT/ML injection, INJECT SUBCUTANEOUSLY 20 UNITS TWICE DAILY WITH A MEAL (Patient taking differently: INJECT SUBCUTANEOUSLY 15  UNITS TWICE DAILY WITH A MEAL), Disp: 20 mL, Rfl: 2 .  pantoprazole (PROTONIX) 40 MG tablet, Take 1 tablet (40 mg total) by mouth daily., Disp: 30 tablet, Rfl: 11 .  rosuvastatin (CRESTOR) 40 MG tablet, TAKE 1 TABLET BY MOUTH EVERY DAILY, Disp: 30 tablet, Rfl: 5 .  oxyCODONE-acetaminophen (PERCOCET) 10-325 MG tablet, One tablet four times daily for severe uncontrolled pain, Disp: 120 tablet, Rfl: 0  BP (!) 101/50   Pulse (!) 55   Ht 5\' 2"  (  1.575 m)   Wt 174 lb (78.9 kg)   BMI 31.83 kg/m   Physical Exam  Constitutional: She is oriented to person, place, and time. She appears well-developed and well-nourished. No distress.  Cardiovascular: Normal rate and intact distal pulses.   Neurological: She is alert and oriented to person, place, and time. She has normal reflexes. She exhibits normal muscle tone. Coordination normal.  Skin: Skin is warm and dry. No rash noted. She is not diaphoretic. No erythema. No pallor.  Psychiatric: She has a normal mood and affect. Her behavior is normal. Judgment and thought content normal.    Ortho Exam  Lumbar spine exam shows tenderness in the lower back with decreased range of motion. Right and left lower extremity full range of motion of the hips and knees with normal stability she has weakness in the right foot normal strength in the quads hams dorsiflexion and plantar flexion of the remaining portions of the extremities. Skin is normal. Pulses are normal. She has no sensory deficit.   ASSESSMENT: Imaging studies include x-ray and MRI   MRI was done 2013  IMPRESSION: MRI from 2013   1.  Again demonstrated is multilevel spondylosis associated with a convex right scoliosis.  There is a grade 1 anterolisthesis at L5- S1 to which a chronic left-sided L5 pars defect  contributes. 2.  Enlarging central extruded disc fragment at L5-S1 with resulting worsening biforaminal stenosis and probable bilateral L5 nerve root encroachment. 3.  The spinal stenosis at the additional levels does not appear significantly changed.   Original Report Authenticated By: Vivia Ewing, M.D.  X-rays IMPRESSION: No fracture or dislocation is seen.   Moderate to severe multilevel degenerative changes with lumbar dextroscoliosis.   Grade 1 spondylolisthesis at L5-S1.     Electronically Signed   By: Julian Hy M.D.   On: 05/30/2013 16:23     PLAN The patient has some lumbar spine disease out of the scope of our practice really. She has options of nonsurgical treatment which were presented to her as physical therapy medical management repeat epidural injections versus referral to appropriate neurosurgeon for spinal decompression.  She is chosen for nonoperative treatment but wants to think about it with her husband first  Arther Abbott, MD 08/08/2015 8:47 AM

## 2015-08-09 ENCOUNTER — Other Ambulatory Visit: Payer: Self-pay | Admitting: "Endocrinology

## 2015-08-10 ENCOUNTER — Telehealth: Payer: Self-pay | Admitting: Orthopedic Surgery

## 2015-08-10 DIAGNOSIS — R69 Illness, unspecified: Secondary | ICD-10-CM | POA: Diagnosis not present

## 2015-08-10 NOTE — Telephone Encounter (Signed)
Patient left message on voicemail. She wants the nurse to give her a call. She is asking about a imaging order  Please call and advise

## 2015-08-22 ENCOUNTER — Other Ambulatory Visit: Payer: Self-pay | Admitting: Family Medicine

## 2015-08-26 ENCOUNTER — Telehealth: Payer: Self-pay | Admitting: Family Medicine

## 2015-08-26 DIAGNOSIS — R293 Abnormal posture: Secondary | ICD-10-CM | POA: Diagnosis not present

## 2015-08-26 DIAGNOSIS — M545 Low back pain: Secondary | ICD-10-CM | POA: Diagnosis not present

## 2015-08-26 DIAGNOSIS — R262 Difficulty in walking, not elsewhere classified: Secondary | ICD-10-CM | POA: Diagnosis not present

## 2015-08-26 DIAGNOSIS — R2681 Unsteadiness on feet: Secondary | ICD-10-CM | POA: Diagnosis not present

## 2015-08-26 NOTE — Telephone Encounter (Signed)
Spoke with patient and she has been unable to get through phone prompts with [facility].   Called [facility] and resolved problem for patient.

## 2015-08-26 NOTE — Telephone Encounter (Signed)
[name] [name] is stating that [facility] is having a problem refilling her medication, she states that they are missing some information, please advise?

## 2015-08-31 DIAGNOSIS — R2681 Unsteadiness on feet: Secondary | ICD-10-CM | POA: Diagnosis not present

## 2015-08-31 DIAGNOSIS — M545 Low back pain: Secondary | ICD-10-CM | POA: Diagnosis not present

## 2015-08-31 DIAGNOSIS — R262 Difficulty in walking, not elsewhere classified: Secondary | ICD-10-CM | POA: Diagnosis not present

## 2015-08-31 DIAGNOSIS — R293 Abnormal posture: Secondary | ICD-10-CM | POA: Diagnosis not present

## 2015-09-07 ENCOUNTER — Other Ambulatory Visit: Payer: Self-pay

## 2015-09-07 DIAGNOSIS — R293 Abnormal posture: Secondary | ICD-10-CM | POA: Diagnosis not present

## 2015-09-07 DIAGNOSIS — R262 Difficulty in walking, not elsewhere classified: Secondary | ICD-10-CM | POA: Diagnosis not present

## 2015-09-07 DIAGNOSIS — R2681 Unsteadiness on feet: Secondary | ICD-10-CM | POA: Diagnosis not present

## 2015-09-07 DIAGNOSIS — M545 Low back pain: Secondary | ICD-10-CM | POA: Diagnosis not present

## 2015-09-07 MED ORDER — OXYCODONE-ACETAMINOPHEN 10-325 MG PO TABS: ORAL_TABLET | ORAL | 0 refills | Status: DC

## 2015-09-08 DIAGNOSIS — R2681 Unsteadiness on feet: Secondary | ICD-10-CM | POA: Diagnosis not present

## 2015-09-08 DIAGNOSIS — R262 Difficulty in walking, not elsewhere classified: Secondary | ICD-10-CM | POA: Diagnosis not present

## 2015-09-08 DIAGNOSIS — R293 Abnormal posture: Secondary | ICD-10-CM | POA: Diagnosis not present

## 2015-09-08 DIAGNOSIS — M545 Low back pain: Secondary | ICD-10-CM | POA: Diagnosis not present

## 2015-09-12 ENCOUNTER — Other Ambulatory Visit: Payer: Self-pay | Admitting: "Endocrinology

## 2015-09-12 DIAGNOSIS — E785 Hyperlipidemia, unspecified: Secondary | ICD-10-CM | POA: Diagnosis not present

## 2015-09-12 DIAGNOSIS — E1121 Type 2 diabetes mellitus with diabetic nephropathy: Secondary | ICD-10-CM | POA: Diagnosis not present

## 2015-09-12 LAB — LIPID PANEL
CHOL/HDL RATIO: 1.8 ratio (ref <=5.0)
CHOLESTEROL: 151 mg/dL (ref 125–200)
HDL: 82 mg/dL (ref >=46)
LDL Cholesterol: 52 mg/dL (ref <130)
TRIGLYCERIDES: 85 mg/dL (ref <150)
VLDL: 17 mg/dL (ref <30)

## 2015-09-12 LAB — COMPLETE METABOLIC PANEL WITH GFR
ALBUMIN: 3.7 g/dL (ref 3.6–5.1)
ALK PHOS: 38 U/L (ref 33–130)
ALT: 14 U/L (ref 6–29)
AST: 19 U/L (ref 10–35)
BUN: 33 mg/dL — AB (ref 7–25)
CALCIUM: 9.6 mg/dL (ref 8.6–10.4)
CO2: 29 mmol/L (ref 20–31)
CREATININE: 1.48 mg/dL — AB (ref 0.60–0.88)
Chloride: 104 mmol/L (ref 98–110)
GFR, EST AFRICAN AMERICAN: 37 mL/min — AB (ref >=60)
GFR, Est Non African American: 32 mL/min — ABNORMAL LOW (ref >=60)
Glucose, Bld: 185 mg/dL — ABNORMAL HIGH (ref 65–99)
POTASSIUM: 5.5 mmol/L — AB (ref 3.5–5.3)
Sodium: 141 mmol/L (ref 135–146)
Total Bilirubin: 0.4 mg/dL (ref 0.2–1.2)
Total Protein: 6.7 g/dL (ref 6.1–8.1)

## 2015-09-12 LAB — HEMOGLOBIN A1C
Hgb A1c MFr Bld: 7.2 % — ABNORMAL HIGH (ref <5.7)
MEAN PLASMA GLUCOSE: 160 mg/dL

## 2015-09-19 ENCOUNTER — Ambulatory Visit: Payer: Medicare HMO | Admitting: "Endocrinology

## 2015-09-23 ENCOUNTER — Other Ambulatory Visit: Payer: Self-pay | Admitting: Family Medicine

## 2015-09-27 ENCOUNTER — Ambulatory Visit (INDEPENDENT_AMBULATORY_CARE_PROVIDER_SITE_OTHER): Payer: Medicare HMO | Admitting: "Endocrinology

## 2015-09-27 ENCOUNTER — Encounter: Payer: Self-pay | Admitting: "Endocrinology

## 2015-09-27 VITALS — BP 144/66 | HR 71 | Ht 62.0 in | Wt 172.0 lb

## 2015-09-27 DIAGNOSIS — E1121 Type 2 diabetes mellitus with diabetic nephropathy: Secondary | ICD-10-CM | POA: Diagnosis not present

## 2015-09-27 DIAGNOSIS — I10 Essential (primary) hypertension: Secondary | ICD-10-CM

## 2015-09-27 DIAGNOSIS — E039 Hypothyroidism, unspecified: Secondary | ICD-10-CM | POA: Diagnosis not present

## 2015-09-27 DIAGNOSIS — E785 Hyperlipidemia, unspecified: Secondary | ICD-10-CM | POA: Diagnosis not present

## 2015-09-27 NOTE — Patient Instructions (Signed)

## 2015-09-27 NOTE — Progress Notes (Signed)
Subjective:    Patient ID: [name] [name], female    DOB: [birth year], PCP Tula Nakayama, MD   Past Medical History:  Diagnosis Date  . Abdominal bloating   . Acute cystitis   . Anticoagulant long-term use    Coumadin  stopped...severe hematoma  from fall  . Aortic valve sclerosis    Ef 60 %...echo...july 2010   . Arthritis   . Atrial flutter ([place]) febuary 2008      atrial flutter... DC Cardioversion...successful... holding sinus as of january 2011 coumadin therapy...discontinued...severe hematoma secondary to fall  . Back pain    with radiculopathy  . Basal cell carcinoma 01/2015   nose  . Carotid artery disease ([place])    Doppler, [facility], February 21, 2012, less than 50% bilateral carotid stenoses  . Chronic granulomatous disease ([place])    right chest ... ct chest... novmber 2009  . Constipation   . Depression with anxiety   . Diabetes mellitus    type 2  . Diverticulosis of colon   . Edema    Painful, April, 2012  . Ejection fraction    EF 60-65%, echo, April 13, 2010, normal RV function, mild mitral regurgitation  . Erosive esophagitis   . Fatigue   . GERD (gastroesophageal reflux disease)   . Hematochezia   . Hematoma    ...buttocks secondary to fall november 2009 with coumadin  coumadin stopped  . Hypercholesterolemia   . Hyperlipidemia   . Hypertension   . IBS (irritable bowel syndrome)   . Mitral regurgitation 2010   mild ...echo...2010  . Nausea   . Normal nuclear stress test    september ,2007...no ischemia  . Obesity   . Osteoarthritis   . Sleep apnea   . Visual loss    unspecified   Past Surgical History:  Procedure Laterality Date  . ABDOMINAL HYSTERECTOMY  2001  . APPENDECTOMY  1948  . BASAL CELL CARCINOMA EXCISION  09/2009   right forarm and back of neck and left side   . bilateral bunion removel  [id number]  . CATARACT EXTRACTION  2001   right   . CATARACT EXTRACTION     left 2005  . COLONOSCOPY   07/17/2006   Dr. Rourk:anal  canal hemorrhoids, diminutive rectal polyp status post cold biopsy.  Otherwise, normal rectum. Left sided and  transverse diverticula.  Diminutive polyp in the ascending colon, cold bx. hyperplastic polyps.   Marland Kitchen [place] and 2001  . ESOPHAGOGASTRODUODENOSCOPY   07/17/2006   Dr. Rourk:single 2 cm distal esophageal erosion consistent with erosive reflux esophagitis, otherwise normal  . ESOPHAGOGASTRODUODENOSCOPY N/A 09/10/2013   Dr. Rourk:Schatzki's ring - status post Venia Minks dilation. Mild erosive reflux esophagitis. Hiatal hernia with fosamax capsule lodged as above  . MALONEY DILATION N/A 09/10/2013   Procedure: Venia Minks DILATION;  Surgeon: Daneil Dolin, MD;  Location: [facility];  Service: Endoscopy;  Laterality: N/A;  . OTHER SURGICAL HISTORY    . REPLACEMENT TOTAL KNEE BILATERAL     2000,2001  . SAVORY DILATION N/A 09/10/2013   Procedure: SAVORY DILATION;  Surgeon: Daneil Dolin, MD;  Location: [facility];  Service: Endoscopy;  Laterality: N/A;  . TONSILLECTOMY  1957   Social History   Social History  . Marital status: Married    Spouse name: N/A  . Number of children: N/A  . Years of education: 64   Occupational History  . retired Retired   Science writer History  Main Topics  . Smoking status: Never Smoker  . Smokeless tobacco: Never Used  . Alcohol use No  . Drug use: No  . Sexual activity: Not Currently   Other Topics Concern  . None   Social History Narrative  . None   Outpatient Encounter Prescriptions as of 09/27/2015  Medication Sig  . aspirin 81 MG tablet Take 81 mg by mouth daily.    . benazepril-hydrochlorthiazide (LOTENSIN HCT) 20-12.5 MG tablet TAKE 2 TABLETS BY MOUTH DAILY (GENERIC FOR LOTENSIN HCT)  . calcium carbonate (OSCAL) 1500 (600 CA) MG TABS tablet Take 2 tablets by mouth 1 day or 1 dose.  . citalopram (CELEXA) 40 MG tablet Take 1 tablet (40 mg total) by mouth daily.  Marland Kitchen DILT-XR 240 MG 24 hr capsule TAKE 1 CAPSULE BY MOUTH  DAILY (GENERIC FOR DILT XR)  . docusate sodium (COLACE) 100 MG capsule Take 100 mg by mouth daily.  . fenofibrate (TRICOR) 145 MG tablet TAKE 1 TABLET BY MOUTH AT BEDTIME (GENERIC FOR TRICOR)  . fish oil-omega-3 fatty acids 1000 MG capsule Take 1 g by mouth daily. 2 caps bid   . fluticasone (FLONASE) 50 MCG/ACT nasal spray USE TWO SPRAY(S) IN EACH NOSTRIL ONCE DAILY  . furosemide (LASIX) 40 MG tablet Take 1 tablet (40 mg total) by mouth daily. (Patient taking differently: Take 40 mg by mouth daily. As needed)  . gabapentin (NEURONTIN) 100 MG capsule TAKE 2 CAPSULES BY MOUTH THREE TIMES DAILY (DOSE INCREASE)  . glucose blood (ACCU-CHEK AVIVA PLUS) test strip Use as instructed 3 x daily. Dx E11.65  . insulin NPH-regular Human (NOVOLIN 70/30 RELION) (70-30) 100 UNIT/ML injection Inject 20 Units into the skin 2 (two) times daily with a meal.  . L-Methylfolate-Algae-B12-B6 (METANX) 3-90.314-2-35 MG CAPS Take 1 capsule by mouth daily.  Marland Kitchen levothyroxine (SYNTHROID, LEVOTHROID) 50 MCG tablet TAKE ONE TABLET BY MOUTH ONCE DAILY  . Linaclotide (LINZESS) 145 MCG CAPS capsule Take 145 mcg by mouth daily as needed.  . Multiple Vitamin (MULTIVITAMIN) tablet Take 1 tablet by mouth daily.  Marland Kitchen oxyCODONE-acetaminophen (PERCOCET) 10-325 MG tablet One tablet four times daily for severe uncontrolled pain  . pantoprazole (PROTONIX) 40 MG tablet Take 1 tablet (40 mg total) by mouth daily.  . ranitidine (ZANTAC) 150 MG tablet TAKE ONE TABLET BY MOUTH AT BEDTIME  . rosuvastatin (CRESTOR) 40 MG tablet TAKE 1 TABLET BY MOUTH EVERY DAILY   No facility-administered encounter medications on file as of 09/27/2015.    ALLERGIES: Allergies  Allergen Reactions  . Lisinopril Nausea And Vomiting  . Reclast [Zoledronic Acid] Other (See Comments)    Jaw pain, and possible deterioration in kidney function  . Alprazolam Other (See Comments)    hyper  . Losartan Other (See Comments)    Raw throat  . Sertraline Hcl Diarrhea  .  Sulfonamide Derivatives Rash   VACCINATION STATUS: Immunization History  Administered Date(s) Administered  . H1N1 11/04/2007  . Influenza Split 10/12/2010, 09/28/2011  . Influenza Whole 10/25/2005, 11/01/2008, 10/04/2009  . Influenza,inj,Quad PF,36+ Mos 11/10/2012, 10/29/2013, 09/28/2014  . Pneumococcal Conjugate-13 05/04/2014  . Pneumococcal Polysaccharide-23 05/27/2003  . Td 10/04/2009  . Zoster 03/25/2006    Diabetes  She presents for her follow-up diabetic visit. She has type 2 diabetes mellitus. Onset time: She was diagnosed at approximate age of 13 years. Her disease course has been improving. There are no hypoglycemic associated symptoms. Pertinent negatives for hypoglycemia include no confusion, headaches, pallor or seizures. Pertinent negatives for diabetes include no  chest pain, no polydipsia, no polyphagia and no polyuria. There are no hypoglycemic complications. Symptoms are improving. Diabetic complications include nephropathy. Risk factors for coronary artery disease include diabetes mellitus, dyslipidemia, hypertension and sedentary lifestyle. When asked about current treatments, none were reported. She is compliant with treatment some of the time. Her weight is stable. She is following a generally unhealthy diet. She has not had a previous visit with a dietitian. She never participates in exercise. Her breakfast blood glucose range is generally 140-180 mg/dl. Her dinner blood glucose range is generally 140-180 mg/dl. Her overall blood glucose range is 140-180 mg/dl. An ACE inhibitor/angiotensin II receptor blocker is being taken. Eye exam is current.  Thyroid Problem  Presents for initial visit. Onset time: she is being diagnosed with primary hypothyroidism today. Patient reports no cold intolerance, diarrhea, heat intolerance or palpitations. Past treatments include nothing. The following procedures have not been performed: radioiodine uptake scan, thyroid FNA, thyroid ultrasound  and thyroidectomy. Her past medical history is significant for hyperlipidemia.  Hyperlipidemia  This is a chronic problem. The current episode started more than 1 year ago. Pertinent negatives include no chest pain, myalgias or shortness of breath. Current antihyperlipidemic treatment includes statins.  Hypertension  This is a chronic problem. The current episode started more than 1 year ago. The problem is controlled. Pertinent negatives include no chest pain, headaches, palpitations or shortness of breath. Past treatments include ACE inhibitors. Hypertensive end-organ damage includes a thyroid problem.     Review of Systems  Constitutional: Negative for unexpected weight change.  HENT: Negative for trouble swallowing and voice change.   Eyes: Negative for visual disturbance.  Respiratory: Negative for cough, shortness of breath and wheezing.   Cardiovascular: Negative for chest pain, palpitations and leg swelling.  Gastrointestinal: Negative for diarrhea, nausea and vomiting.  Endocrine: Negative for cold intolerance, heat intolerance, polydipsia, polyphagia and polyuria.  Musculoskeletal: Positive for arthralgias and back pain. Negative for myalgias.  Skin: Negative for color change, pallor, rash and wound.  Neurological: Negative for seizures and headaches.  Psychiatric/Behavioral: Negative for confusion and suicidal ideas.    Objective:    BP (!) 144/66   Pulse 71   Ht 5\' 2"  (1.575 m)   Wt 172 lb (78 kg)   BMI 31.46 kg/m   Wt Readings from Last 3 Encounters:  09/27/15 172 lb (78 kg)  08/03/15 174 lb (78.9 kg)  08/01/15 171 lb 12.8 oz (77.9 kg)    Physical Exam  Constitutional: She is oriented to person, place, and time. She appears well-developed.  Walks with her walker.  HENT:  Head: Normocephalic and atraumatic.  Eyes: EOM are normal.  Neck: Normal range of motion. Neck supple. No tracheal deviation present. No thyromegaly present.  Cardiovascular: Normal rate and  regular rhythm.   Pulmonary/Chest: Effort normal and breath sounds normal.  Abdominal: Soft. Bowel sounds are normal. There is no tenderness. There is no guarding.  Musculoskeletal: She exhibits no edema.  Walks with her walker.  Neurological: She is alert and oriented to person, place, and time. She has normal reflexes. No cranial nerve deficit. Coordination normal.  Skin: Skin is warm and dry. No rash noted. No erythema. No pallor.  Psychiatric: She has a normal mood and affect. Judgment normal.    Results for orders placed or performed in visit on 09/12/15  COMPLETE METABOLIC PANEL WITH GFR  Result Value Ref Range   Sodium 141 135 - 146 mmol/L   Potassium 5.5 (H) 3.5 - 5.3  mmol/L   Chloride 104 98 - 110 mmol/L   CO2 29 20 - 31 mmol/L   Glucose, Bld 185 (H) 65 - 99 mg/dL   BUN 33 (H) 7 - 25 mg/dL   Creat 1.48 (H) 0.60 - 0.88 mg/dL   Total Bilirubin 0.4 0.2 - 1.2 mg/dL   Alkaline Phosphatase 38 33 - 130 U/L   AST 19 10 - 35 U/L   ALT 14 6 - 29 U/L   Total Protein 6.7 6.1 - 8.1 g/dL   Albumin 3.7 3.6 - 5.1 g/dL   Calcium 9.6 8.6 - 10.4 mg/dL   GFR, Est African American 37 (L) >=60 mL/min   GFR, Est Non African American 32 (L) >=60 mL/min  Lipid panel  Result Value Ref Range   Cholesterol 151 125 - 200 mg/dL   Triglycerides 85 <150 mg/dL   HDL 82 >=46 mg/dL   Total CHOL/HDL Ratio 1.8 <=5.0 Ratio   VLDL 17 <30 mg/dL   LDL Cholesterol 52 <130 mg/dL  Hemoglobin A1c  Result Value Ref Range   Hgb A1c MFr Bld 7.2 (H) <5.7 %   Mean Plasma Glucose 160 mg/dL   Complete Blood Count (Most recent): Lab Results  Component Value Date   WBC 6.0 07/08/2014   HGB 11.3 (L) 07/08/2014   HCT 34.2 (L) 07/08/2014   MCV 93.2 07/08/2014   PLT 251 07/08/2014   Chemistry (most recent): Lab Results  Component Value Date   NA 141 09/12/2015   K 5.5 (H) 09/12/2015   CL 104 09/12/2015   CO2 29 09/12/2015   BUN 33 (H) 09/12/2015   CREATININE 1.48 (H) 09/12/2015   Diabetic Labs (most  recent): Lab Results  Component Value Date   HGBA1C 7.2 (H) 09/12/2015   HGBA1C 7.5 (H) 06/03/2015   HGBA1C 7.3 (H) 02/24/2015   Lipid Panel     Component Value Date/Time   CHOL 151 09/12/2015 0726   TRIG 85 09/12/2015 0726   HDL 82 09/12/2015 0726   CHOLHDL 1.8 09/12/2015 0726   VLDL 17 09/12/2015 0726   LDLCALC 52 09/12/2015 0726     Assessment & Plan:   1. Diabetes mellitus with nephropathy ([place])  - Her diabetes is complicated by CKD, and she  remains at a high risk for more acute and chronic complications of diabetes which include CAD, CVA, CKD, retinopathy, and neuropathy. These are all discussed in detail with the patient.  Patient came with controlled glucose profile, and  recent A1c is  Improving at 7.2%, generally improving from  9.5 %.  Her blood glucose readings will be scanned into her medical records.  Recent labs reviewed.   - I have re-counseled the patient on diet management   by adopting a carbohydrate restricted / protein rich  Diet.  - Suggestion is made for patient to avoid simple carbohydrates   from their diet including Cakes , Desserts, Ice Cream,  Soda (  diet and regular) , Sweet Tea , Candies,  Chips, Cookies, Artificial Sweeteners,   and "Sugar-free" Products .  This will help patient to have stable blood glucose profile and potentially avoid unintended  Weight gain.  - Patient is advised to stick to a routine mealtimes to eat 3 meals  a day and avoid unnecessary snacks ( to snack only to correct hypoglycemia).  - I have approached patient with the following individualized plan to manage diabetes and patient agrees.  - Given the long duration of her diabetes, she will continue to  need insulin therapy, she did reasonably well on premixed Novolin 70/30. - I will continue  Novolin 70/30 15 units AC BREAKFAST and supper, associated with strict monitoring of glucose  AC and HS. - Patient is warned not to take insulin without proper monitoring per  orders. -Adjustment parameters are given for hypo and hyperglycemia in writing. -Patient is encouraged to call clinic for blood glucose levels less than 70 or above 300 mg /dl.  -Patient is not a candidate for MTF, SGLT2 i  due to CKD.  - Patient specific target  for A1c; LDL, HDL, Triglycerides, and  Waist Circumference were discussed in detail.  2) BP/HTN: controlled. Continue current medications including ACEI/ARB. 3) Lipids/HPL:  continue statins. 4)  Weight/Diet: CDE consult in progress, exercise, and carbohydrates information provided.  5) hypothyroidism: Her labs are consistent with appropriate replacement. I will continue levothyroxine 50 g by mouth every morning.  - We discussed about correct intake of levothyroxine, at fasting, with water, separated by at least 30 minutes from breakfast, and separated by more than 4 hours from calcium, iron, multivitamins, acid reflux medications (PPIs). -Patient is made aware of the fact that thyroid hormone replacement is needed for life, dose to be adjusted by periodic monitoring of thyroid function tests.  6) Chronic Care/Health Maintenance:  -Patient is on ACEI/ARB and Statin medications and encouraged to continue to follow up with Ophthalmology, Podiatrist at least yearly or according to recommendations, and advised to  stay away from smoking. I have recommended yearly flu vaccine and pneumonia vaccination at least every 5 years; moderate intensity exercise for up to 150 minutes weekly; and  sleep for at least 7 hours a day.  - 25 minutes of time was spent on the care of this patient , 50% of which was applied for counseling on diabetes complications and their preventions.  - I advised patient to maintain close follow up with Tula Nakayama, MD for primary care needs.  Patient is asked to bring meter and  blood glucose logs during their next visit.   Follow up plan: -Return in about 3 months (around 12/27/2015) for follow up with  pre-visit labs, meter, and logs.  Glade Lloyd, MD Phone: [phone]  Fax: [phone]   09/27/2015, 3:46 PM

## 2015-09-29 ENCOUNTER — Telehealth: Payer: Self-pay

## 2015-09-29 NOTE — Telephone Encounter (Signed)
[name] [name],   This patient would like her husband to be triaged for a colonoscopy. He has never had one before. I am not sure the process to do that. Can you call and speak to him? Thanks so much!

## 2015-09-29 NOTE — Telephone Encounter (Signed)
LMOM for a return call.  

## 2015-09-29 NOTE — Telephone Encounter (Signed)
Pt's husband called and he is having some constipation and will have his PCP send a referral to [place]. He has never had a colonoscopy.  See his chart. ( Ray Christofferson)

## 2015-09-29 NOTE — Telephone Encounter (Signed)
Pt called asking to speak to [facility]. I advised her that she was seeing patients at the moment and asked if there was anything I can help her with, she said no, she wanted Vicente Males to call her at [phone]

## 2015-10-04 DIAGNOSIS — L84 Corns and callosities: Secondary | ICD-10-CM | POA: Diagnosis not present

## 2015-10-04 DIAGNOSIS — B351 Tinea unguium: Secondary | ICD-10-CM | POA: Diagnosis not present

## 2015-10-04 DIAGNOSIS — E1142 Type 2 diabetes mellitus with diabetic polyneuropathy: Secondary | ICD-10-CM | POA: Diagnosis not present

## 2015-10-06 ENCOUNTER — Telehealth: Payer: Self-pay | Admitting: Family Medicine

## 2015-10-06 NOTE — Telephone Encounter (Signed)
[name] is asking to speak to Dr. Moshe Cipro regarding continuing her visits with Dr. Aline Brochure going

## 2015-10-07 NOTE — Telephone Encounter (Signed)
Please review last visit with Dr. Aline Brochure.  Unsure of patient's concern.

## 2015-10-10 ENCOUNTER — Ambulatory Visit: Payer: Medicare HMO | Admitting: Orthopedic Surgery

## 2015-10-10 NOTE — Telephone Encounter (Signed)
Last visit stated return if symptoms worsen, so it appears her f/u is up to her , she was referred to pT also. Will discuss with her further at her visit here, she may bring date fwd if needed

## 2015-10-11 ENCOUNTER — Ambulatory Visit (INDEPENDENT_AMBULATORY_CARE_PROVIDER_SITE_OTHER): Payer: Medicare HMO

## 2015-10-11 ENCOUNTER — Ambulatory Visit (INDEPENDENT_AMBULATORY_CARE_PROVIDER_SITE_OTHER): Payer: Medicare HMO | Admitting: Orthopedic Surgery

## 2015-10-11 ENCOUNTER — Encounter: Payer: Self-pay | Admitting: Orthopedic Surgery

## 2015-10-11 VITALS — BP 149/67 | HR 63 | Wt 170.0 lb

## 2015-10-11 DIAGNOSIS — G8929 Other chronic pain: Secondary | ICD-10-CM | POA: Diagnosis not present

## 2015-10-11 DIAGNOSIS — M25571 Pain in right ankle and joints of right foot: Secondary | ICD-10-CM

## 2015-10-11 NOTE — Patient Instructions (Signed)

## 2015-10-11 NOTE — Progress Notes (Signed)
Patient ID: [name] [name], female   DOB: 08-19-1929, 82 y.o.   MRN: [id number]  Chief Complaint  Patient presents with  . Follow-up    RIGHT ANKLE PAIN    HPI [name] [name] is a 82 y.o. female.  Presents for evaluation of subfibular ankle pain.  The patient complains of severe sub-fibular ankle pain which is sharp and worse with weightbearing and unrelieved by 10 mg Percocet. Her condition is complicated by history of right leg radicular pain radiating from her hip into her foot. She has no history of trauma.  Review of Systems Review of Systems  Constitutional: Positive for activity change.  Musculoskeletal: Positive for back pain and gait problem.  Neurological: Positive for weakness and numbness.     Past Medical History:  Diagnosis Date  . Abdominal bloating   . Acute cystitis   . Anticoagulant long-term use    Coumadin  stopped...severe hematoma  from fall  . Aortic valve sclerosis    Ef 60 %...echo...july 2010   . Arthritis   . Atrial flutter ([place]) febuary 2008      atrial flutter... DC Cardioversion...successful... holding sinus as of january 2011 coumadin therapy...discontinued...severe hematoma secondary to fall  . Back pain    with radiculopathy  . Basal cell carcinoma 01/2015   nose  . Carotid artery disease ([place])    Doppler, [facility], February 21, 2012, less than 50% bilateral carotid stenoses  . Chronic granulomatous disease ([place])    right chest ... ct chest... novmber 2009  . Constipation   . Depression with anxiety   . Diabetes mellitus    type 2  . Diverticulosis of colon   . Edema    Painful, April, 2012  . Ejection fraction    EF 60-65%, echo, April 13, 2010, normal RV function, mild mitral regurgitation  . Erosive esophagitis   . Fatigue   . GERD (gastroesophageal reflux disease)   . Hematochezia   . Hematoma    ...buttocks secondary to fall november 2009 with coumadin  coumadin stopped  . Hypercholesterolemia   . Hyperlipidemia   .  Hypertension   . IBS (irritable bowel syndrome)   . Mitral regurgitation 2010   mild ...echo...2010  . Nausea   . Normal nuclear stress test    september ,2007...no ischemia  . Obesity   . Osteoarthritis   . Sleep apnea   . Visual loss    unspecified    Past Surgical History:  Procedure Laterality Date  . ABDOMINAL HYSTERECTOMY  2001  . APPENDECTOMY  1948  . BASAL CELL CARCINOMA EXCISION  09/2009   right forarm and back of neck and left side   . bilateral bunion removel  [id number]  . CATARACT EXTRACTION  2001   right   . CATARACT EXTRACTION     left 2005  . COLONOSCOPY   07/17/2006   Dr. Rourk:anal canal hemorrhoids, diminutive rectal polyp status post cold biopsy.  Otherwise, normal rectum. Left sided and  transverse diverticula.  Diminutive polyp in the ascending colon, cold bx. hyperplastic polyps.   Marland Kitchen [place] and 2001  . ESOPHAGOGASTRODUODENOSCOPY   07/17/2006   Dr. Rourk:single 2 cm distal esophageal erosion consistent with erosive reflux esophagitis, otherwise normal  . ESOPHAGOGASTRODUODENOSCOPY N/A 09/10/2013   Dr. Rourk:Schatzki's ring - status post Venia Minks dilation. Mild erosive reflux esophagitis. Hiatal hernia with fosamax capsule lodged as above  . MALONEY DILATION N/A 09/10/2013   Procedure: MALONEY DILATION;  Surgeon:  Daneil Dolin, MD;  Location: [facility];  Service: Endoscopy;  Laterality: N/A;  . OTHER SURGICAL HISTORY    . REPLACEMENT TOTAL KNEE BILATERAL     2000,2001  . SAVORY DILATION N/A 09/10/2013   Procedure: SAVORY DILATION;  Surgeon: Daneil Dolin, MD;  Location: [facility];  Service: Endoscopy;  Laterality: N/A;  . TONSILLECTOMY  1957    Social History Social History  Substance Use Topics  . Smoking status: Never Smoker  . Smokeless tobacco: Never Used  . Alcohol use No    Allergies  Allergen Reactions  . Lisinopril Nausea And Vomiting  . Reclast [Zoledronic Acid] Other (See Comments)    Jaw pain,  and possible deterioration in kidney function  . Alprazolam Other (See Comments)    hyper  . Losartan Other (See Comments)    Raw throat  . Sertraline Hcl Diarrhea  . Sulfonamide Derivatives Rash    Current Meds  Medication Sig  . aspirin 81 MG tablet Take 81 mg by mouth daily.    . benazepril-hydrochlorthiazide (LOTENSIN HCT) 20-12.5 MG tablet TAKE 2 TABLETS BY MOUTH DAILY (GENERIC FOR LOTENSIN HCT)  . calcium carbonate (OSCAL) 1500 (600 CA) MG TABS tablet Take 2 tablets by mouth 1 day or 1 dose.  . citalopram (CELEXA) 40 MG tablet Take 1 tablet (40 mg total) by mouth daily.  . diclofenac sodium (VOLTAREN) 1 % GEL Apply topically 4 (four) times daily.  Marland Kitchen DILT-XR 240 MG 24 hr capsule TAKE 1 CAPSULE BY MOUTH DAILY (GENERIC FOR DILT XR)  . docusate sodium (COLACE) 100 MG capsule Take 100 mg by mouth daily.  . fenofibrate (TRICOR) 145 MG tablet TAKE 1 TABLET BY MOUTH AT BEDTIME (GENERIC FOR TRICOR)  . fish oil-omega-3 fatty acids 1000 MG capsule Take 1 g by mouth daily. 2 caps bid   . fluticasone (FLONASE) 50 MCG/ACT nasal spray USE TWO SPRAY(S) IN EACH NOSTRIL ONCE DAILY  . furosemide (LASIX) 40 MG tablet Take 1 tablet (40 mg total) by mouth daily. (Patient taking differently: Take 40 mg by mouth daily. As needed)  . gabapentin (NEURONTIN) 100 MG capsule TAKE 2 CAPSULES BY MOUTH THREE TIMES DAILY (DOSE INCREASE)  . glucose blood (ACCU-CHEK AVIVA PLUS) test strip Use as instructed 3 x daily. Dx E11.65  . insulin NPH-regular Human (NOVOLIN 70/30 RELION) (70-30) 100 UNIT/ML injection Inject 20 Units into the skin 2 (two) times daily with a meal.  . L-Methylfolate-Algae-B12-B6 (METANX) 3-90.314-2-35 MG CAPS Take 1 capsule by mouth daily.  Marland Kitchen levothyroxine (SYNTHROID, LEVOTHROID) 50 MCG tablet TAKE ONE TABLET BY MOUTH ONCE DAILY  . Linaclotide (LINZESS) 145 MCG CAPS capsule Take 145 mcg by mouth daily as needed.  . Multiple Vitamin (MULTIVITAMIN) tablet Take 1 tablet by mouth daily.  Marland Kitchen  oxyCODONE-acetaminophen (PERCOCET) 10-325 MG tablet One tablet four times daily for severe uncontrolled pain  . pantoprazole (PROTONIX) 40 MG tablet Take 1 tablet (40 mg total) by mouth daily.  . ranitidine (ZANTAC) 150 MG tablet TAKE ONE TABLET BY MOUTH AT BEDTIME  . rosuvastatin (CRESTOR) 40 MG tablet TAKE 1 TABLET BY MOUTH EVERY DAILY      Physical Exam Physical Exam BP (!) 149/67   Pulse 63   Wt 170 lb (77.1 kg)   BMI 31.09 kg/m   Gen. appearance. The patient is well-developed and well-nourished, grooming and hygiene are normal. There are no gross congenital abnormalities  The patient is alert and oriented to person place and time  Mood and  affect are normal  Ambulation She struggles with a walker and she walks with her lumbopelvic junction severely flexed  Examination reveals the following: On inspection we find normal alignment to the right ankle with tenderness just beneath the fibula  With the range of motion of  right ankle we find no deficits in range of motion  Stability tests were normal    Strength tests revealed grade 5 motor strength  Skin we find no rash ulceration or erythema  Sensation is normal to pressure, pain, palpation  Impression vascular system shows no peripheral edema  Data Reviewed Plain films show no arthritis although she may have some subfibular impingement  Assessment    Pain right ankle. No history of ankle arthritis or findings of ankle arthritis  Recommend subfibular injection  The point of maximal tenderness was identified the patient consented verbally to injection. Timeout confirmed injection site  Skin prepped with ethyl chloride and alcohol and injection performed with Depo-Medrol and lidocaine 1% 40 mg of Depo-Medrol was used  This went without complication was well tolerated.    Plan    Recheck in 4 weeks. Diagnosis unclear       Arther Abbott 10/11/2015, 3:20 PM

## 2015-10-14 ENCOUNTER — Other Ambulatory Visit: Payer: Self-pay

## 2015-10-14 DIAGNOSIS — R69 Illness, unspecified: Secondary | ICD-10-CM | POA: Diagnosis not present

## 2015-10-14 MED ORDER — OXYCODONE-ACETAMINOPHEN 10-325 MG PO TABS: ORAL_TABLET | ORAL | 0 refills | Status: DC

## 2015-10-24 DIAGNOSIS — C44311 Basal cell carcinoma of skin of nose: Secondary | ICD-10-CM | POA: Diagnosis not present

## 2015-10-29 DIAGNOSIS — I10 Essential (primary) hypertension: Secondary | ICD-10-CM | POA: Diagnosis not present

## 2015-10-29 DIAGNOSIS — E785 Hyperlipidemia, unspecified: Secondary | ICD-10-CM | POA: Diagnosis not present

## 2015-10-29 DIAGNOSIS — E559 Vitamin D deficiency, unspecified: Secondary | ICD-10-CM | POA: Diagnosis not present

## 2015-10-29 LAB — CBC
HCT: 36.2 % (ref 35.0–45.0)
HEMOGLOBIN: 11.7 g/dL (ref 11.7–15.5)
MCH: 30.6 pg (ref 27.0–33.0)
MCHC: 32.3 g/dL (ref 32.0–36.0)
MCV: 94.8 fL (ref 80.0–100.0)
MPV: 10.6 fL (ref 7.5–12.5)
Platelets: 186 10*3/uL (ref 140–400)
RBC: 3.82 MIL/uL (ref 3.80–5.10)
RDW: 13 % (ref 11.0–15.0)
WBC: 8.3 10*3/uL (ref 3.8–10.8)

## 2015-10-29 LAB — LIPID PANEL
CHOL/HDL RATIO: 2.1 ratio (ref <=5.0)
Cholesterol: 157 mg/dL (ref 125–200)
HDL: 75 mg/dL (ref >=46)
LDL Cholesterol: 67 mg/dL (ref <130)
Triglycerides: 75 mg/dL (ref <150)
VLDL: 15 mg/dL (ref <30)

## 2015-10-31 ENCOUNTER — Ambulatory Visit (INDEPENDENT_AMBULATORY_CARE_PROVIDER_SITE_OTHER): Payer: Medicare HMO

## 2015-10-31 VITALS — BP 136/60 | HR 60 | Resp 18 | Ht 62.0 in | Wt 175.0 lb

## 2015-10-31 DIAGNOSIS — Z0000 Encounter for general adult medical examination without abnormal findings: Secondary | ICD-10-CM | POA: Diagnosis not present

## 2015-10-31 DIAGNOSIS — Z23 Encounter for immunization: Secondary | ICD-10-CM | POA: Diagnosis not present

## 2015-10-31 LAB — VITAMIN D 25 HYDROXY (VIT D DEFICIENCY, FRACTURES): VIT D 25 HYDROXY: 37 ng/mL (ref 30–100)

## 2015-10-31 NOTE — Assessment & Plan Note (Signed)
After obtaining informed consent, the vaccine is  administered by LPN.  

## 2015-10-31 NOTE — Progress Notes (Signed)
Subjective:    [name] [name] is a 82 y.o. female who presents for Medicare Annual/Subsequent preventive examination.  Preventive Screening-Counseling & Management  Tobacco History  Smoking Status  . Never Smoker  Smokeless Tobacco  . Never Used      Current Problems (verified) Patient Active Problem List   Diagnosis Date Noted  . Constipation 08/01/2015  . Right ankle pain 07/14/2015  . Annual physical exam 05/12/2015  . Primary hypothyroidism 11/29/2014  . Change in stool habits 07/08/2014  . Spinal stenosis in cervical region 02/27/2014  . Osteopenia 10/17/2013  . CKD (chronic kidney disease) stage 3, GFR 30-59 ml/min 10/17/2013  . Bilateral leg weakness 08/17/2013  . Poor balance 08/17/2013  . GERD with stricture 06/29/2013  . Seasonal allergies 06/29/2013  . Carotid artery disease ([place])   . Multiple falls 02/15/2012  . Hand fracture 10/26/2011  . Torn rotator cuff, left 05/02/2011  . Urinary incontinence 08/04/2010  . Hyperlipidemia   . Aortic valve sclerosis   . Atrial flutter ([place]) 01/28/2009  . MITRAL REGURGITATION, 0 (MILD) 09/28/2008  . Overweight (BMI 25.0-29.9) 09/12/2008  . Diverticulosis of large intestine 05/11/2008  . Basal cell carcinoma 04/30/2008  . EROSIVE ESOPHAGITIS 04/30/2008  . IRRITABLE BOWEL SYNDROME 04/30/2008  . Back pain of lumbar region with sciatica 11/04/2007  . Diabetes mellitus with nephropathy ([place]) 03/11/2007  . Anxiety and depression 03/11/2007  . Essential hypertension 03/11/2007  . OSTEOARTHRITIS 03/11/2007    Medications Prior to Visit Current Outpatient Prescriptions on File Prior to Visit  Medication Sig Dispense Refill  . aspirin 81 MG tablet Take 81 mg by mouth daily.      . benazepril-hydrochlorthiazide (LOTENSIN HCT) 20-12.5 MG tablet TAKE 2 TABLETS BY MOUTH DAILY (GENERIC FOR LOTENSIN HCT) 180 tablet 1  . calcium carbonate (OSCAL) 1500 (600 CA) MG TABS tablet Take 2 tablets by mouth 1 day or 1 dose.    . citalopram  (CELEXA) 40 MG tablet Take 1 tablet (40 mg total) by mouth daily. 30 tablet 5  . diclofenac sodium (VOLTAREN) 1 % GEL Apply topically 4 (four) times daily.    Marland Kitchen DILT-XR 240 MG 24 hr capsule TAKE 1 CAPSULE BY MOUTH DAILY (GENERIC FOR DILT XR) 90 capsule 1  . docusate sodium (COLACE) 100 MG capsule Take 100 mg by mouth daily.    . fenofibrate (TRICOR) 145 MG tablet TAKE 1 TABLET BY MOUTH AT BEDTIME (GENERIC FOR TRICOR) 90 tablet 1  . fish oil-omega-3 fatty acids 1000 MG capsule Take 1 g by mouth daily. 2 caps bid     . fluticasone (FLONASE) 50 MCG/ACT nasal spray USE TWO SPRAY(S) IN EACH NOSTRIL ONCE DAILY 16 g 4  . furosemide (LASIX) 40 MG tablet Take 1 tablet (40 mg total) by mouth daily. (Patient taking differently: Take 40 mg by mouth daily. As needed) 30 tablet 4  . gabapentin (NEURONTIN) 100 MG capsule TAKE 2 CAPSULES BY MOUTH THREE TIMES DAILY (DOSE INCREASE) 180 capsule 5  . glucose blood (ACCU-CHEK AVIVA PLUS) test strip Use as instructed 3 x daily. Dx E11.65 100 each 5  . insulin NPH-regular Human (NOVOLIN 70/30 RELION) (70-30) 100 UNIT/ML injection Inject 20 Units into the skin 2 (two) times daily with a meal. 20 mL 2  . L-Methylfolate-Algae-B12-B6 (METANX) 3-90.314-2-35 MG CAPS Take 1 capsule by mouth daily. 30 capsule 4  . levothyroxine (SYNTHROID, LEVOTHROID) 50 MCG tablet TAKE ONE TABLET BY MOUTH ONCE DAILY 30 tablet 3  . Linaclotide (LINZESS) 145 MCG CAPS capsule  Take 145 mcg by mouth daily as needed.    . Multiple Vitamin (MULTIVITAMIN) tablet Take 1 tablet by mouth daily.    Marland Kitchen oxyCODONE-acetaminophen (PERCOCET) 10-325 MG tablet One tablet four times daily for severe uncontrolled pain 120 tablet 0  . pantoprazole (PROTONIX) 40 MG tablet Take 1 tablet (40 mg total) by mouth daily. 30 tablet 11  . ranitidine (ZANTAC) 150 MG tablet TAKE ONE TABLET BY MOUTH AT BEDTIME 30 tablet 3  . rosuvastatin (CRESTOR) 40 MG tablet TAKE 1 TABLET BY MOUTH EVERY DAILY 30 tablet 5   No current  facility-administered medications on file prior to visit.     Current Medications (verified) Current Outpatient Prescriptions  Medication Sig Dispense Refill  . aspirin 81 MG tablet Take 81 mg by mouth daily.      . benazepril-hydrochlorthiazide (LOTENSIN HCT) 20-12.5 MG tablet TAKE 2 TABLETS BY MOUTH DAILY (GENERIC FOR LOTENSIN HCT) 180 tablet 1  . calcium carbonate (OSCAL) 1500 (600 CA) MG TABS tablet Take 2 tablets by mouth 1 day or 1 dose.    . citalopram (CELEXA) 40 MG tablet Take 1 tablet (40 mg total) by mouth daily. 30 tablet 5  . diclofenac sodium (VOLTAREN) 1 % GEL Apply topically 4 (four) times daily.    Marland Kitchen DILT-XR 240 MG 24 hr capsule TAKE 1 CAPSULE BY MOUTH DAILY (GENERIC FOR DILT XR) 90 capsule 1  . docusate sodium (COLACE) 100 MG capsule Take 100 mg by mouth daily.    . fenofibrate (TRICOR) 145 MG tablet TAKE 1 TABLET BY MOUTH AT BEDTIME (GENERIC FOR TRICOR) 90 tablet 1  . fish oil-omega-3 fatty acids 1000 MG capsule Take 1 g by mouth daily. 2 caps bid     . fluticasone (FLONASE) 50 MCG/ACT nasal spray USE TWO SPRAY(S) IN EACH NOSTRIL ONCE DAILY 16 g 4  . furosemide (LASIX) 40 MG tablet Take 1 tablet (40 mg total) by mouth daily. (Patient taking differently: Take 40 mg by mouth daily. As needed) 30 tablet 4  . gabapentin (NEURONTIN) 100 MG capsule TAKE 2 CAPSULES BY MOUTH THREE TIMES DAILY (DOSE INCREASE) 180 capsule 5  . glucose blood (ACCU-CHEK AVIVA PLUS) test strip Use as instructed 3 x daily. Dx E11.65 100 each 5  . insulin NPH-regular Human (NOVOLIN 70/30 RELION) (70-30) 100 UNIT/ML injection Inject 20 Units into the skin 2 (two) times daily with a meal. 20 mL 2  . L-Methylfolate-Algae-B12-B6 (METANX) 3-90.314-2-35 MG CAPS Take 1 capsule by mouth daily. 30 capsule 4  . levothyroxine (SYNTHROID, LEVOTHROID) 50 MCG tablet TAKE ONE TABLET BY MOUTH ONCE DAILY 30 tablet 3  . Linaclotide (LINZESS) 145 MCG CAPS capsule Take 145 mcg by mouth daily as needed.    . Multiple Vitamin  (MULTIVITAMIN) tablet Take 1 tablet by mouth daily.    Marland Kitchen oxyCODONE-acetaminophen (PERCOCET) 10-325 MG tablet One tablet four times daily for severe uncontrolled pain 120 tablet 0  . pantoprazole (PROTONIX) 40 MG tablet Take 1 tablet (40 mg total) by mouth daily. 30 tablet 11  . ranitidine (ZANTAC) 150 MG tablet TAKE ONE TABLET BY MOUTH AT BEDTIME 30 tablet 3  . rosuvastatin (CRESTOR) 40 MG tablet TAKE 1 TABLET BY MOUTH EVERY DAILY 30 tablet 5   No current facility-administered medications for this visit.      Allergies (verified) Lisinopril; Reclast [zoledronic acid]; Alprazolam; Losartan; Sertraline hcl; and Sulfonamide derivatives   PAST HISTORY  Family History Family History  Problem Relation Age of Onset  . Dementia Mother   .  Heart disease Father   . Diabetes    . Arthritis    . Colon cancer Neg Hx     Social History Social History  Substance Use Topics  . Smoking status: Never Smoker  . Smokeless tobacco: Never Used  . Alcohol use No     Are there smokers in your home (other than you)? No  Risk Factors Current exercise habits: Exercise is limited by orthopedic condition(s): requiring the use of a walker.  Dietary issues discussed: Portion control and upcoming changes with food labels   Cardiac risk factors: advanced age (older than 30 for men, 47 for women), diabetes mellitus, dyslipidemia, hypertension, obesity (BMI >= 30 kg/m2) and sedentary lifestyle.  Depression Screen (Note: if answer to either of the following is "Yes", a more complete depression screening is indicated)   Over the past two weeks, have you felt down, depressed or hopeless? No  Over the past two weeks, have you felt little interest or pleasure in doing things? No  Have you lost interest or pleasure in daily life? No  Do you often feel hopeless? No  Do you cry easily over simple problems? No  Activities of Daily Living In your present state of health, do you have any difficulty performing the  following activities?:  Driving? Yes Managing money?  No Feeding yourself? No Getting from bed to chair? Yes due to problems with mobility  Climbing a flight of stairs? Yes due to the use of walker  Preparing food and eating?: No Bathing or showering? Yes due to mobility  Getting dressed: No Getting to the toilet? No Using the toilet:No Moving around from place to place: Yes see above  In the past year have you fallen or had a near fall?:Yes 1 within the last week    Are you sexually active?  No  Do you have more than one partner?  na  Hearing Difficulties: No Do you often ask people to speak up or repeat themselves? No Do you experience ringing or noises in your ears? No Do you have difficulty understanding soft or whispered voices? No   Do you feel that you have a problem with memory? Yes  Do you often misplace items? Yes  Do you feel safe at home?  Yes  Cognitive Testing  Alert? Yes  Normal Appearance?Yes  Oriented to person? Yes  Place? Yes   Time? Yes  Recall of three objects?  Yes  Can perform simple calculations? Yes  Displays appropriate judgment?Yes  Can read the correct time from a watch face?Yes   Advanced Directives have been discussed with the patient? Yes  List the Names of Other Physician/Practitioners you currently use: 1.  Dr. Aline Brochure (orthopedics) 2.  Dr. Irving Shows (podiatry) 3.  Dr. Nevada Crane (dermatology)  4.  Dr. Dorris Fetch (endocrinology) 5.  Dr. Iona Hansen (opthamology)   Indicate any recent Medical Services you may have received from other than [facility] providers in the past year (date may be approximate).  Immunization History  Administered Date(s) Administered  . H1N1 11/04/2007  . Influenza Split 10/12/2010, 09/28/2011  . Influenza Whole 10/25/2005, 11/01/2008, 10/04/2009  . Influenza,inj,Quad PF,36+ Mos 11/10/2012, 10/29/2013, 09/28/2014  . Pneumococcal Conjugate-13 05/04/2014  . Pneumococcal Polysaccharide-23 05/27/2003  . Td 10/04/2009  . Zoster  03/25/2006    Screening Tests Health Maintenance  Topic Date Due  . INFLUENZA VACCINE  08/02/2015  . OPHTHALMOLOGY EXAM  10/20/2015  . HEMOGLOBIN A1C  03/11/2016  . FOOT EXAM  05/13/2016  . TETANUS/TDAP  10/05/2019  .  DEXA SCAN  Completed  . ZOSTAVAX  Completed  . PNA vac Low Risk Adult  Completed    All answers were reviewed with the patient and necessary referrals were made:  Vanetta Mulders, LPN   [id number]   History reviewed: allergies, current medications, past family history, past medical history, past social history, past surgical history and problem list  Review of Systems A comprehensive review of systems was negative.    Objective:     Vision by Snellen chart: right eye:20/40, left eye:20/40  Body mass index is 32.02 kg/m. BP 136/60   Pulse 60   Resp 18   Ht 5\' 2"  (1.575 m)   Wt 175 lb 0.6 oz (79.4 kg)   SpO2 96%   BMI 32.02 kg/m   No exam performed today, annual wellness without physical exam.     Assessment:     Medicare annual wellness visit, subsequent Annual exam as documented. Counseling done  re healthy lifestyle involving commitment to 150 minutes exercise per week, heart healthy diet, and attaining healthy weight.The importance of adequate sleep also discussed. Regular seat belt use and home safety, is also discussed. Changes in health habits are decided on by the patient with goals and time frames  set for achieving them. Immunization and cancer screening needs are specifically addressed at this visit.   Need for prophylactic vaccination and inoculation against influenza After obtaining informed consent, the vaccine is  administered by LPN.      Plan:     During the course of the visit the patient was educated and counseled about appropriate screening and preventive services including:    Influenza vaccine  Diet review for nutrition referral? Yes ____  Not Indicated __x__   Patient Instructions (the written plan)  was given to the patient.  Medicare Attestation I have personally reviewed: The patient's medical and social history Their use of alcohol, tobacco or illicit drugs Their current medications and supplements The patient's functional ability including ADLs,fall risks, home safety risks, cognitive, and hearing and visual impairment Diet and physical activities Evidence for depression or mood disorders  The patient's weight, height, BMI, and visual acuity have been recorded in the chart.  I have made referrals, counseling, and provided education to the patient based on review of the above and I have provided the patient with a written personalized care plan for preventive services.     [name] [place], [place]   [id number]

## 2015-10-31 NOTE — Patient Instructions (Signed)
Thank you for choosing [facility] for your health care needs  The Annual Wellness Visit is designed to allow [place] the chance to assist you in preserving and improving you health.   Dr.   Moshe Cipro will see you back in 3 months for your next visit  If any labs are needed they will be mailed to you with the date to have them drawn   If you have any concerns please don't hesitate to call.  The new # is [phone]

## 2015-10-31 NOTE — Assessment & Plan Note (Signed)

## 2015-11-08 ENCOUNTER — Ambulatory Visit (INDEPENDENT_AMBULATORY_CARE_PROVIDER_SITE_OTHER): Payer: Medicare HMO | Admitting: Orthopedic Surgery

## 2015-11-08 ENCOUNTER — Encounter: Payer: Self-pay | Admitting: Orthopedic Surgery

## 2015-11-08 DIAGNOSIS — M19071 Primary osteoarthritis, right ankle and foot: Secondary | ICD-10-CM

## 2015-11-08 NOTE — Patient Instructions (Signed)
WILL ORDER CT SCAN AND CALL YOU WITH APPOINTMENT

## 2015-11-08 NOTE — Progress Notes (Signed)
Patient ID: [name] [name], female   DOB: 06-04-1929, 82 y.o.   MRN: [id number]  Chief Complaint  Patient presents with  . Follow-up    right ankle pain    HPI [name] [name] is a 82 y.o. female.   HPI  HPI [name] [name] is a 82 y.o. female.  Presents for re-evaluation of subfibular ankle pain.   The patient Complained of severe sub-fibular ankle pain which is sharp and worse with weightbearing and unrelieved by 10 mg Percocet. Her condition is complicated by history of right leg radicular pain radiating from her hip into her foot. She has no history of trauma.  She does get temporary relief from a topical cream but is not on left relief as she is having persistent weightbearing pain; she has received an injection with minimal relief   Review of Systems Review of Systems  Constitutional: Positive for activity change.  Musculoskeletal: Positive for back pain and gait problem.  Neurological: Positive for weakness and numbness.    Review of Systems Review of Systems   Physical Exam There were no vitals taken for this visit.   Physical Exam  She uses a walker for ambulation she has tenderness in the subtalar region and anterior ankle joint with 5 of dorsiflexion and 10 of plantarflexion normal subtalar motion with some pain with eversion the ankle is stable she has no atrophy her skin is without lesion she has a good distal pulse temperature of the foot is warm and she has normal sensation to soft touch. Her mood is normal she is oriented 3 her appearance is normal

## 2015-11-10 ENCOUNTER — Other Ambulatory Visit: Payer: Self-pay | Admitting: "Endocrinology

## 2015-11-14 ENCOUNTER — Other Ambulatory Visit: Payer: Self-pay

## 2015-11-14 MED ORDER — METANX 3-90.314-2-35 MG PO CAPS
1.0000 | ORAL_CAPSULE | Freq: Every day | ORAL | 1 refills | Status: AC
Start: 2015-11-14 — End: ?

## 2015-11-17 ENCOUNTER — Telehealth: Payer: Self-pay | Admitting: *Deleted

## 2015-11-17 NOTE — Telephone Encounter (Signed)
All notes were faxed and reviewed. Insurance denied request for CT.

## 2015-11-18 ENCOUNTER — Other Ambulatory Visit: Payer: Self-pay

## 2015-11-18 MED ORDER — OXYCODONE-ACETAMINOPHEN 10-325 MG PO TABS
ORAL_TABLET | ORAL | 0 refills | Status: DC
Start: 2015-11-18 — End: 2015-12-30

## 2015-12-07 ENCOUNTER — Encounter: Payer: Self-pay | Admitting: *Deleted

## 2015-12-07 NOTE — Telephone Encounter (Signed)
This encounter was created in error - please disregard.

## 2015-12-16 ENCOUNTER — Other Ambulatory Visit: Payer: Self-pay | Admitting: Family Medicine

## 2015-12-18 ENCOUNTER — Other Ambulatory Visit: Payer: Self-pay | Admitting: "Endocrinology

## 2015-12-19 DIAGNOSIS — R69 Illness, unspecified: Secondary | ICD-10-CM | POA: Diagnosis not present

## 2015-12-20 ENCOUNTER — Other Ambulatory Visit: Payer: Self-pay

## 2015-12-20 DIAGNOSIS — Z794 Long term (current) use of insulin: Secondary | ICD-10-CM | POA: Diagnosis not present

## 2015-12-29 ENCOUNTER — Ambulatory Visit: Payer: Medicare HMO | Admitting: "Endocrinology

## 2015-12-29 NOTE — Telephone Encounter (Signed)
Per staff message Carole Civil, MD  Christia Reading, LPN        Make sure she knows  No further recomendation   Called patient, no answer

## 2015-12-30 ENCOUNTER — Other Ambulatory Visit: Payer: Self-pay

## 2015-12-30 MED ORDER — OXYCODONE-ACETAMINOPHEN 10-325 MG PO TABS
ORAL_TABLET | ORAL | 0 refills | Status: DC
Start: 2015-12-30 — End: 2016-01-25

## 2015-12-31 ENCOUNTER — Other Ambulatory Visit: Payer: Self-pay | Admitting: "Endocrinology

## 2015-12-31 DIAGNOSIS — E1121 Type 2 diabetes mellitus with diabetic nephropathy: Secondary | ICD-10-CM | POA: Diagnosis not present

## 2015-12-31 LAB — COMPREHENSIVE METABOLIC PANEL
ALBUMIN: 4.2 g/dL (ref 3.6–5.1)
ALK PHOS: 37 U/L (ref 33–130)
ALT: 17 U/L (ref 6–29)
AST: 21 U/L (ref 10–35)
BUN: 33 mg/dL — ABNORMAL HIGH (ref 7–25)
CALCIUM: 9.5 mg/dL (ref 8.6–10.4)
CO2: 27 mmol/L (ref 20–31)
Chloride: 106 mmol/L (ref 98–110)
Creat: 1.15 mg/dL — ABNORMAL HIGH (ref 0.60–0.88)
Glucose, Bld: 101 mg/dL — ABNORMAL HIGH (ref 65–99)
POTASSIUM: 5 mmol/L (ref 3.5–5.3)
Sodium: 142 mmol/L (ref 135–146)
TOTAL PROTEIN: 6.7 g/dL (ref 6.1–8.1)
Total Bilirubin: 0.4 mg/dL (ref 0.2–1.2)

## 2016-01-02 LAB — HEMOGLOBIN A1C
HEMOGLOBIN A1C: 6.9 % — AB (ref <5.7)
Mean Plasma Glucose: 151 mg/dL

## 2016-01-09 ENCOUNTER — Telehealth: Payer: Self-pay | Admitting: Orthopedic Surgery

## 2016-01-09 NOTE — Telephone Encounter (Signed)
Patient called asking about the CT appointment we were supposed to set up. I told her what I saw in her chart as a telephone note. She said that her insurance told her that they did not get a diagnosis code with the request. She is asking if you could please try again. Her ankle is really bothering her.  Please call and advise  [phone]

## 2016-01-11 ENCOUNTER — Other Ambulatory Visit: Payer: Self-pay | Admitting: *Deleted

## 2016-01-12 NOTE — Telephone Encounter (Signed)
I have entered another request for CT, will contact patient once response is received

## 2016-01-16 ENCOUNTER — Telehealth: Payer: Self-pay

## 2016-01-17 NOTE — Telephone Encounter (Signed)
Needs to contact ortho, and make j him aware of everything she has said so that further eval can be done, may need to see him again in the office also

## 2016-01-23 ENCOUNTER — Other Ambulatory Visit: Payer: Self-pay | Admitting: Family Medicine

## 2016-01-24 ENCOUNTER — Ambulatory Visit (INDEPENDENT_AMBULATORY_CARE_PROVIDER_SITE_OTHER): Payer: Medicare HMO | Admitting: "Endocrinology

## 2016-01-24 ENCOUNTER — Encounter: Payer: Self-pay | Admitting: "Endocrinology

## 2016-01-24 VITALS — BP 140/74 | HR 68 | Ht 62.0 in | Wt 176.0 lb

## 2016-01-24 DIAGNOSIS — E039 Hypothyroidism, unspecified: Secondary | ICD-10-CM

## 2016-01-24 DIAGNOSIS — E782 Mixed hyperlipidemia: Secondary | ICD-10-CM

## 2016-01-24 DIAGNOSIS — I10 Essential (primary) hypertension: Secondary | ICD-10-CM | POA: Diagnosis not present

## 2016-01-24 DIAGNOSIS — E1121 Type 2 diabetes mellitus with diabetic nephropathy: Secondary | ICD-10-CM

## 2016-01-24 NOTE — Telephone Encounter (Signed)
Patient states that she has been in contact with Dr. Ruthe Mannan office many times.  She was told recently that imaging would be reordered in 6 months.

## 2016-01-24 NOTE — Progress Notes (Signed)
Subjective:    Patient ID: [name] [name], female    DOB: [birth year], PCP Tula Nakayama, MD   Past Medical History:  Diagnosis Date  . Abdominal bloating   . Acute cystitis   . Anticoagulant long-term use    Coumadin  stopped...severe hematoma  from fall  . Aortic valve sclerosis    Ef 60 %...echo...july 2010   . Arthritis   . Atrial flutter ([place]) febuary 2008      atrial flutter... DC Cardioversion...successful... holding sinus as of january 2011 coumadin therapy...discontinued...severe hematoma secondary to fall  . Back pain    with radiculopathy  . Basal cell carcinoma 01/2015   nose  . Carotid artery disease ([place])    Doppler, [facility], February 21, 2012, less than 50% bilateral carotid stenoses  . Chronic granulomatous disease ([place])    right chest ... ct chest... novmber 2009  . Constipation   . Depression with anxiety   . Diabetes mellitus    type 2  . Diverticulosis of colon   . Edema    Painful, April, 2012  . Ejection fraction    EF 60-65%, echo, April 13, 2010, normal RV function, mild mitral regurgitation  . Erosive esophagitis   . Fatigue   . GERD (gastroesophageal reflux disease)   . Hematochezia   . Hematoma    ...buttocks secondary to fall november 2009 with coumadin  coumadin stopped  . Hypercholesterolemia   . Hyperlipidemia   . Hypertension   . IBS (irritable bowel syndrome)   . Mitral regurgitation 2010   mild ...echo...2010  . Nausea   . Normal nuclear stress test    september ,2007...no ischemia  . Obesity   . Osteoarthritis   . Sleep apnea   . Visual loss    unspecified   Past Surgical History:  Procedure Laterality Date  . ABDOMINAL HYSTERECTOMY  2001  . APPENDECTOMY  1948  . BASAL CELL CARCINOMA EXCISION  09/2009   right forarm and back of neck and left side   . bilateral bunion removel  [id number]  . CATARACT EXTRACTION  2001   right   . CATARACT EXTRACTION     left 2005  . COLONOSCOPY   07/17/2006   Dr. Rourk:anal  canal hemorrhoids, diminutive rectal polyp status post cold biopsy.  Otherwise, normal rectum. Left sided and  transverse diverticula.  Diminutive polyp in the ascending colon, cold bx. hyperplastic polyps.   Marland Kitchen [place] and 2001  . ESOPHAGOGASTRODUODENOSCOPY   07/17/2006   Dr. Rourk:single 2 cm distal esophageal erosion consistent with erosive reflux esophagitis, otherwise normal  . ESOPHAGOGASTRODUODENOSCOPY N/A 09/10/2013   Dr. Rourk:Schatzki's ring - status post Venia Minks dilation. Mild erosive reflux esophagitis. Hiatal hernia with fosamax capsule lodged as above  . MALONEY DILATION N/A 09/10/2013   Procedure: Venia Minks DILATION;  Surgeon: Daneil Dolin, MD;  Location: [facility];  Service: Endoscopy;  Laterality: N/A;  . OTHER SURGICAL HISTORY    . REPLACEMENT TOTAL KNEE BILATERAL     2000,2001  . SAVORY DILATION N/A 09/10/2013   Procedure: SAVORY DILATION;  Surgeon: Daneil Dolin, MD;  Location: [facility];  Service: Endoscopy;  Laterality: N/A;  . TONSILLECTOMY  1957   Social History   Social History  . Marital status: Married    Spouse name: N/A  . Number of children: N/A  . Years of education: 64   Occupational History  . retired Retired   Science writer History  Main Topics  . Smoking status: Never Smoker  . Smokeless tobacco: Never Used  . Alcohol use No  . Drug use: No  . Sexual activity: Not Currently   Other Topics Concern  . None   Social History Narrative  . None   Outpatient Encounter Prescriptions as of 01/24/2016  Medication Sig  . ACCU-CHEK AVIVA PLUS test strip USE ONE TEST STRIP TO CHECK BLOOD GLUCOSE THREE TIMES DAILY  . aspirin 81 MG tablet Take 81 mg by mouth daily.    . benazepril-hydrochlorthiazide (LOTENSIN HCT) 20-12.5 MG tablet TAKE 2 TABLETS BY MOUTH DAILY (GENERIC FOR LOTENSIN HCT)  . calcium carbonate (OSCAL) 1500 (600 CA) MG TABS tablet Take 2 tablets by mouth 1 day or 1 dose.  . citalopram (CELEXA) 40 MG tablet TAKE  ONE TABLET BY MOUTH ONCE DAILY  . diclofenac sodium (VOLTAREN) 1 % GEL Apply topically 4 (four) times daily.  Marland Kitchen DILT-XR 240 MG 24 hr capsule TAKE 1 CAPSULE BY MOUTH DAILY  . docusate sodium (COLACE) 100 MG capsule Take 100 mg by mouth daily.  . fenofibrate (TRICOR) 145 MG tablet TAKE 1 TABLET BY MOUTH AT BEDTIME  . fish oil-omega-3 fatty acids 1000 MG capsule Take 1 g by mouth daily. 2 caps bid   . fluticasone (FLONASE) 50 MCG/ACT nasal spray USE TWO SPRAY(S) IN EACH NOSTRIL ONCE DAILY  . furosemide (LASIX) 40 MG tablet Take 1 tablet (40 mg total) by mouth daily. (Patient taking differently: Take 40 mg by mouth daily. As needed)  . gabapentin (NEURONTIN) 100 MG capsule TAKE 2 CAPSULES BY MOUTH THREE TIMES DAILY (DOSE INCREASE)  . insulin NPH-regular Human (NOVOLIN 70/30 RELION) (70-30) 100 UNIT/ML injection Inject 20 Units into the skin 2 (two) times daily with a meal.  . L-Methylfolate-Algae-B12-B6 (METANX) 3-90.314-2-35 MG CAPS Take 1 capsule by mouth daily.  Marland Kitchen levothyroxine (SYNTHROID, LEVOTHROID) 50 MCG tablet TAKE ONE TABLET BY MOUTH ONCE DAILY  . Linaclotide (LINZESS) 145 MCG CAPS capsule Take 145 mcg by mouth daily as needed.  . Multiple Vitamin (MULTIVITAMIN) tablet Take 1 tablet by mouth daily.  Marland Kitchen oxyCODONE-acetaminophen (PERCOCET) 10-325 MG tablet One tablet four times daily for severe uncontrolled pain  . pantoprazole (PROTONIX) 40 MG tablet Take 1 tablet (40 mg total) by mouth daily.  . ranitidine (ZANTAC) 150 MG tablet TAKE ONE TABLET BY MOUTH AT BEDTIME  . rosuvastatin (CRESTOR) 40 MG tablet TAKE ONE TABLET BY MOUTH ONCE DAILY   No facility-administered encounter medications on file as of 01/24/2016.    ALLERGIES: Allergies  Allergen Reactions  . Lisinopril Nausea And Vomiting  . Reclast [Zoledronic Acid] Other (See Comments)    Jaw pain, and possible deterioration in kidney function  . Alprazolam Other (See Comments)    hyper  . Losartan Other (See Comments)    Raw throat   . Sertraline Hcl Diarrhea  . Sulfonamide Derivatives Rash   VACCINATION STATUS: Immunization History  Administered Date(s) Administered  . H1N1 11/04/2007  . Influenza Split 10/12/2010, 09/28/2011  . Influenza Whole 10/25/2005, 11/01/2008, 10/04/2009  . Influenza,inj,Quad PF,36+ Mos 11/10/2012, 10/29/2013, 09/28/2014, 10/31/2015  . Pneumococcal Conjugate-13 05/04/2014  . Pneumococcal Polysaccharide-23 05/27/2003  . Td 10/04/2009  . Zoster 03/25/2006    Diabetes  She presents for her follow-up diabetic visit. She has type 2 diabetes mellitus. Onset time: She was diagnosed at approximate age of 7 years. Her disease course has been improving. There are no hypoglycemic associated symptoms. Pertinent negatives for hypoglycemia include no confusion, headaches, pallor or  seizures. Pertinent negatives for diabetes include no chest pain, no polydipsia, no polyphagia and no polyuria. There are no hypoglycemic complications. Symptoms are improving. Diabetic complications include nephropathy. Risk factors for coronary artery disease include diabetes mellitus, dyslipidemia, hypertension and sedentary lifestyle. When asked about current treatments, none were reported. She is compliant with treatment some of the time. Her weight is stable. She is following a generally unhealthy diet. She has not had a previous visit with a dietitian. She never participates in exercise. Her breakfast blood glucose range is generally 140-180 mg/dl. Her dinner blood glucose range is generally 140-180 mg/dl. Her overall blood glucose range is 140-180 mg/dl. An ACE inhibitor/angiotensin II receptor blocker is being taken. Eye exam is current.  Thyroid Problem  Presents for initial visit. Onset time: she is being diagnosed with primary hypothyroidism today. Patient reports no cold intolerance, diarrhea, heat intolerance or palpitations. Past treatments include nothing. The following procedures have not been performed: radioiodine  uptake scan, thyroid FNA, thyroid ultrasound and thyroidectomy. Her past medical history is significant for hyperlipidemia.  Hyperlipidemia  This is a chronic problem. The current episode started more than 1 year ago. Pertinent negatives include no chest pain, myalgias or shortness of breath. Current antihyperlipidemic treatment includes statins.  Hypertension  This is a chronic problem. The current episode started more than 1 year ago. The problem is controlled. Pertinent negatives include no chest pain, headaches, palpitations or shortness of breath. Past treatments include ACE inhibitors. Identifiable causes of hypertension include a thyroid problem.     Review of Systems  Constitutional: Negative for unexpected weight change.  HENT: Negative for trouble swallowing and voice change.   Eyes: Negative for visual disturbance.  Respiratory: Negative for cough, shortness of breath and wheezing.   Cardiovascular: Negative for chest pain, palpitations and leg swelling.  Gastrointestinal: Negative for diarrhea, nausea and vomiting.  Endocrine: Negative for cold intolerance, heat intolerance, polydipsia, polyphagia and polyuria.  Musculoskeletal: Positive for arthralgias and back pain. Negative for myalgias.  Skin: Negative for color change, pallor, rash and wound.  Neurological: Negative for seizures and headaches.  Psychiatric/Behavioral: Negative for confusion and suicidal ideas.    Objective:    BP 140/74   Pulse 68   Ht 5\' 2"  (1.575 m)   Wt 176 lb (79.8 kg)   BMI 32.19 kg/m   Wt Readings from Last 3 Encounters:  01/24/16 176 lb (79.8 kg)  10/31/15 175 lb 0.6 oz (79.4 kg)  10/11/15 170 lb (77.1 kg)    Physical Exam  Constitutional: She is oriented to person, place, and time. She appears well-developed.  Walks with her walker.  HENT:  Head: Normocephalic and atraumatic.  Eyes: EOM are normal.  Neck: Normal range of motion. Neck supple. No tracheal deviation present. No  thyromegaly present.  Cardiovascular: Normal rate and regular rhythm.   Pulmonary/Chest: Effort normal and breath sounds normal.  Abdominal: Soft. Bowel sounds are normal. There is no tenderness. There is no guarding.  Musculoskeletal: She exhibits no edema.  Walks with her walker.  Neurological: She is alert and oriented to person, place, and time. She has normal reflexes. No cranial nerve deficit. Coordination normal.  Skin: Skin is warm and dry. No rash noted. No erythema. No pallor.  Psychiatric: She has a normal mood and affect. Judgment normal.    Results for orders placed or performed in visit on 12/31/15  Comprehensive metabolic panel  Result Value Ref Range   Sodium 142 135 - 146 mmol/L   Potassium  5.0 3.5 - 5.3 mmol/L   Chloride 106 98 - 110 mmol/L   CO2 27 20 - 31 mmol/L   Glucose, Bld 101 (H) 65 - 99 mg/dL   BUN 33 (H) 7 - 25 mg/dL   Creat 1.15 (H) 0.60 - 0.88 mg/dL   Total Bilirubin 0.4 0.2 - 1.2 mg/dL   Alkaline Phosphatase 37 33 - 130 U/L   AST 21 10 - 35 U/L   ALT 17 6 - 29 U/L   Total Protein 6.7 6.1 - 8.1 g/dL   Albumin 4.2 3.6 - 5.1 g/dL   Calcium 9.5 8.6 - 10.4 mg/dL  Hemoglobin A1c  Result Value Ref Range   Hgb A1c MFr Bld 6.9 (H) <5.7 %   Mean Plasma Glucose 151 mg/dL   Complete Blood Count (Most recent): Lab Results  Component Value Date   WBC 8.3 10/29/2015   HGB 11.7 10/29/2015   HCT 36.2 10/29/2015   MCV 94.8 10/29/2015   PLT 186 10/29/2015   Chemistry (most recent): Lab Results  Component Value Date   NA 142 12/31/2015   K 5.0 12/31/2015   CL 106 12/31/2015   CO2 27 12/31/2015   BUN 33 (H) 12/31/2015   CREATININE 1.15 (H) 12/31/2015   Diabetic Labs (most recent): Lab Results  Component Value Date   HGBA1C 6.9 (H) 12/31/2015   HGBA1C 7.2 (H) 09/12/2015   HGBA1C 7.5 (H) 06/03/2015   Lipid Panel     Component Value Date/Time   CHOL 157 10/29/2015 0842   TRIG 75 10/29/2015 0842   HDL 75 10/29/2015 0842   CHOLHDL 2.1 10/29/2015  0842   VLDL 15 10/29/2015 0842   LDLCALC 67 10/29/2015 0842     Assessment & Plan:   1. Diabetes mellitus with nephropathy ([place])  - Her diabetes is complicated by CKD, and she  remains at a high risk for more acute and chronic complications of diabetes which include CAD, CVA, CKD, retinopathy, and neuropathy. These are all discussed in detail with the patient.  Patient came with controlled glucose profile, and  recent A1c is  Improving at 6.9%, generally improving from  9.5 %.  Her blood glucose readings will be scanned into her medical records.  Recent labs reviewed.   - I have re-counseled the patient on diet management   by adopting a carbohydrate restricted / protein rich  Diet.  - Suggestion is made for patient to avoid simple carbohydrates   from their diet including Cakes , Desserts, Ice Cream,  Soda (  diet and regular) , Sweet Tea , Candies,  Chips, Cookies, Artificial Sweeteners,   and "Sugar-free" Products .  This will help patient to have stable blood glucose profile and potentially avoid unintended  Weight gain.  - Patient is advised to stick to a routine mealtimes to eat 3 meals  a day and avoid unnecessary snacks ( to snack only to correct hypoglycemia).  - I have approached patient with the following individualized plan to manage diabetes and patient agrees.  - Given the long duration of her diabetes, she will continue to need insulin therapy, she did reasonably well on premixed Novolin 70/30. - I will continue  Novolin 70/30 15 units AC BREAKFAST and supper, associated with strict monitoring of glucose  AC and HS. - Patient is warned not to take insulin without proper monitoring per orders. -Adjustment parameters are given for hypo and hyperglycemia in writing. -Patient is encouraged to call clinic for blood glucose levels less than  70 or above 300 mg /dl.  -Patient is not a candidate for MTF, SGLT2 i  due to CKD.  - Patient specific target  for A1c; LDL, HDL,  Triglycerides, and  Waist Circumference were discussed in detail.  2) BP/HTN: controlled. Continue current medications including ACEI/ARB. 3) Lipids/HPL:  continue statins. 4)  Weight/Diet: CDE consult in progress, exercise, and carbohydrates information provided.  5) hypothyroidism: Her labs are consistent with appropriate replacement. I will continue levothyroxine 50 g by mouth every morning.  - We discussed about correct intake of levothyroxine, at fasting, with water, separated by at least 30 minutes from breakfast, and separated by more than 4 hours from calcium, iron, multivitamins, acid reflux medications (PPIs). -Patient is made aware of the fact that thyroid hormone replacement is needed for life, dose to be adjusted by periodic monitoring of thyroid function tests.  6) Chronic Care/Health Maintenance:  -Patient is on ACEI/ARB and Statin medications and encouraged to continue to follow up with Ophthalmology, Podiatrist at least yearly or according to recommendations, and advised to  stay away from smoking. I have recommended yearly flu vaccine and pneumonia vaccination at least every 5 years; moderate intensity exercise for up to 150 minutes weekly; and  sleep for at least 7 hours a day.  - 25 minutes of time was spent on the care of this patient , 50% of which was applied for counseling on diabetes complications and their preventions.  - I advised patient to maintain close follow up with Tula Nakayama, MD for primary care needs.  Patient is asked to bring meter and  blood glucose logs during their next visit.   Follow up plan: -Return in about 3 months (around 04/23/2016) for follow up with pre-visit labs, meter, and logs.  Glade Lloyd, MD Phone: [phone]  Fax: [phone]   01/24/2016, 3:39 PM

## 2016-01-24 NOTE — Telephone Encounter (Signed)
ADVISED PATIENT THAT REQUEST WAS NOT ACCEPTED DUE TO IT BEING A DUPLICATE WITHIN 6 MONTHS, ORIGINAL WAS DENIED

## 2016-01-25 ENCOUNTER — Ambulatory Visit (INDEPENDENT_AMBULATORY_CARE_PROVIDER_SITE_OTHER): Payer: Medicare HMO | Admitting: Family Medicine

## 2016-01-25 ENCOUNTER — Encounter: Payer: Self-pay | Admitting: Family Medicine

## 2016-01-25 VITALS — BP 138/72 | HR 56 | Temp 97.9°F | Resp 18 | Ht 62.0 in | Wt 168.0 lb

## 2016-01-25 DIAGNOSIS — F329 Major depressive disorder, single episode, unspecified: Secondary | ICD-10-CM

## 2016-01-25 DIAGNOSIS — E1121 Type 2 diabetes mellitus with diabetic nephropathy: Secondary | ICD-10-CM | POA: Diagnosis not present

## 2016-01-25 DIAGNOSIS — G8929 Other chronic pain: Secondary | ICD-10-CM

## 2016-01-25 DIAGNOSIS — F418 Other specified anxiety disorders: Secondary | ICD-10-CM

## 2016-01-25 DIAGNOSIS — F32A Depression, unspecified: Secondary | ICD-10-CM

## 2016-01-25 DIAGNOSIS — F419 Anxiety disorder, unspecified: Secondary | ICD-10-CM

## 2016-01-25 DIAGNOSIS — E782 Mixed hyperlipidemia: Secondary | ICD-10-CM

## 2016-01-25 DIAGNOSIS — I10 Essential (primary) hypertension: Secondary | ICD-10-CM

## 2016-01-25 MED ORDER — OXYCODONE-ACETAMINOPHEN 10-325 MG PO TABS: ORAL_TABLET | ORAL | 0 refills | Status: DC

## 2016-01-25 NOTE — Patient Instructions (Addendum)
F/u in 3 month, call if you need me sooner  Reduced dose of oxycodone to one twice daily, plan to reduce further at next visit. PLEASE commit to taking gabapentin as prescribed. Script for topical prep to apply to areaa where you hurt on right thigh is being sent to [facility] on improved blood sugar  Fasting lipid, cmp and TSH in 3 months

## 2016-01-25 NOTE — Progress Notes (Signed)
[name] [name]     MRN: [id number]      DOB: 1929-04-19   HPI [name] [name] is here for review of pain management with specific  attention to effectiveness and safety.dose reduction and medication adjustment done, has not been taking the gabapentin as prescribed and she needs to The pain contract is reviewed and signed. New policy of handing 3 months of medication to the patient at the visit with a required 3 month follow up is also started ROS See HPI  Denies recent fever or chills. Denies sinus pressure, nasal congestion, ear pain or sore throat. Denies chest congestion, productive cough or wheezing. Denies chest pains, palpitations and leg swelling Denies abdominal pain, nausea, vomiting,diarrhea or constipation.   Denies dysuria, frequency, hesitancy C/o chronic  joint pain,  and limitation in mobility. Denies headaches, seizures,  Denies uncontrolled depression, anxiety or insomnia. Denies skin break down or rash.   PE  BP (!) 148/58 (BP Location: Right Arm, Patient Position: Sitting, Cuff Size: Normal)   Pulse (!) 56   Temp 97.9 F (36.6 C) (Oral)   Resp 18   Ht 5\' 2"  (1.575 m)   Wt 168 lb (76.2 kg)   LMP  (Approximate)   SpO2 94%   BMI 30.73 kg/m   Patient alert and oriented and in no cardiopulmonary distress.  HEENT: No facial asymmetry, EOMI,   oropharynx pink and moist.  Neck supple no JVD, no mass.  Chest: Clear to auscultation bilaterally.  CVS: S1, S2 systolic  murmur, no S3.Regular rate.  ABD: Soft non tender.   Ext: No edema  MS: Adequate ROM spine, shoulders, hips and knees.  Skin: Intact, no ulcerations or rash noted.  Psych: Good eye contact, normal affect. Memory intact not anxious or depressed appearing.  CNS: CN 2-12 intact, power,  normal throughout.no focal deficits noted.   Assessment & Plan  Encounter for chronic pain management Pain policy reviewed and updated. Chronic pain management reviewed with patient, and medication adjustment  made to improve efficacy and safety. Pain contract reviewed and signed  Essential hypertension Controlled, no change in medication DASH diet and commitment to daily physical activity for a minimum of 30 minutes discussed and encouraged, as a part of hypertension management. The importance of attaining a healthy weight is also discussed.  BP/Weight 01/25/2016 01/24/2016 10/31/2015 10/11/2015 09/27/2015 08/03/2015 [id number]  Systolic BP 0000000 [id number] [id number]  Diastolic BP 72 74 60 67 66 50 62  Wt. (Lbs) 168 176 175.04 170 172 174 171.8  BMI 30.73 32.19 32.02 31.09 31.46 31.83 31.42       Hyperlipidemia Hyperlipidemia:Low fat diet discussed and encouraged.   Lipid Panel  Lab Results  Component Value Date   CHOL 157 10/29/2015   HDL 75 10/29/2015   LDLCALC 67 10/29/2015   TRIG 75 10/29/2015   CHOLHDL 2.1 10/29/2015    Updated lab needed at/ before next visit.    Anxiety and depression Controlled, no change in medication   Diabetes mellitus with nephropathy Managed by endo, and well controlled [name] [name] is reminded of the importance of commitment to daily physical activity for 30 minutes or more, as able and the need to limit carbohydrate intake to 30 to 60 grams per meal to help with blood sugar control.   The need to take medication as prescribed, test blood sugar as directed, and to call between visits if there is a concern that blood sugar is uncontrolled is also discussed.  [name] [name] is reminded of the importance of daily foot exam, annual eye examination, and good blood sugar, blood pressure and cholesterol control.  Diabetic Labs Latest Ref Rng & Units 12/31/2015 10/29/2015 09/12/2015 06/03/2015 02/24/2015  HbA1c <5.7 % 6.9(H) - 7.2(H) 7.5(H) 7.3(H)  Microalbumin <2.0 mg/dL - - - - -  Micro/Creat Ratio 0.0 - 30.0 mg/g - - - - -  Chol 125 - 200 mg/dL - 157 151 - -  HDL >=46 mg/dL - 75 82 - -  Calc LDL <130 mg/dL - 67 52 - -  Triglycerides <150 mg/dL - 75 85 - -    Creatinine 0.60 - 0.88 mg/dL 1.15(H) - 1.48(H) 1.43(H) 1.13(H)   BP/Weight 01/25/2016 01/24/2016 10/31/2015 10/11/2015 09/27/2015 08/03/2015 [id number]  Systolic BP 0000000 [id number] 123456 123456 [id number]  Diastolic BP 72 74 60 67 66 50 62  Wt. (Lbs) 168 176 175.04 170 172 174 171.8  BMI 30.73 32.19 32.02 31.09 31.46 31.83 31.42   Foot/eye exam completion dates Latest Ref Rng & Units 05/12/2015 10/20/2014  Eye Exam No Retinopathy - No Retinopathy  Foot exam Order - - -  Foot Form Completion - Done -

## 2016-01-25 NOTE — Telephone Encounter (Signed)
Patient will discuss further at ov today 1/24

## 2016-01-26 ENCOUNTER — Other Ambulatory Visit: Payer: Self-pay | Admitting: Family Medicine

## 2016-02-06 ENCOUNTER — Encounter: Payer: Self-pay | Admitting: Family Medicine

## 2016-02-06 NOTE — Assessment & Plan Note (Signed)
Controlled, no change in medication DASH diet and commitment to daily physical activity for a minimum of 30 minutes discussed and encouraged, as a part of hypertension management. The importance of attaining a healthy weight is also discussed.  BP/Weight 01/25/2016 01/24/2016 10/31/2015 10/11/2015 09/27/2015 08/03/2015 [id number]  Systolic BP 0000000 [id number] 123456 123456 [id number]  Diastolic BP 72 74 60 67 66 50 62  Wt. (Lbs) 168 176 175.04 170 172 174 171.8  BMI 30.73 32.19 32.02 31.09 31.46 31.83 31.42

## 2016-02-06 NOTE — Assessment & Plan Note (Signed)
Managed by endo, and well controlled [name] [name] is reminded of the importance of commitment to daily physical activity for 30 minutes or more, as able and the need to limit carbohydrate intake to 30 to 60 grams per meal to help with blood sugar control.   The need to take medication as prescribed, test blood sugar as directed, and to call between visits if there is a concern that blood sugar is uncontrolled is also discussed.   [name] [name] is reminded of the importance of daily foot exam, annual eye examination, and good blood sugar, blood pressure and cholesterol control.  Diabetic Labs Latest Ref Rng & Units 12/31/2015 10/29/2015 09/12/2015 06/03/2015 02/24/2015  HbA1c <5.7 % 6.9(H) - 7.2(H) 7.5(H) 7.3(H)  Microalbumin <2.0 mg/dL - - - - -  Micro/Creat Ratio 0.0 - 30.0 mg/g - - - - -  Chol 125 - 200 mg/dL - 157 151 - -  HDL >=46 mg/dL - 75 82 - -  Calc LDL <130 mg/dL - 67 52 - -  Triglycerides <150 mg/dL - 75 85 - -  Creatinine 0.60 - 0.88 mg/dL 1.15(H) - 1.48(H) 1.43(H) 1.13(H)   BP/Weight 01/25/2016 01/24/2016 10/31/2015 10/11/2015 09/27/2015 08/03/2015 [id number]  Systolic BP 0000000 [id number] 123456 123456 [id number]  Diastolic BP 72 74 60 67 66 50 62  Wt. (Lbs) 168 176 175.04 170 172 174 171.8  BMI 30.73 32.19 32.02 31.09 31.46 31.83 31.42   Foot/eye exam completion dates Latest Ref Rng & Units 05/12/2015 10/20/2014  Eye Exam No Retinopathy - No Retinopathy  Foot exam Order - - -  Foot Form Completion - Done -

## 2016-02-06 NOTE — Assessment & Plan Note (Signed)
Hyperlipidemia:Low fat diet discussed and encouraged.   Lipid Panel  Lab Results  Component Value Date   CHOL 157 10/29/2015   HDL 75 10/29/2015   LDLCALC 67 10/29/2015   TRIG 75 10/29/2015   CHOLHDL 2.1 10/29/2015    Updated lab needed at/ before next visit.

## 2016-02-06 NOTE — Assessment & Plan Note (Signed)
Pain policy reviewed and updated. Chronic pain management reviewed with patient, and medication adjustment made to improve efficacy and safety. Pain contract reviewed and signed

## 2016-02-06 NOTE — Assessment & Plan Note (Signed)
Controlled, no change in medication  

## 2016-02-07 ENCOUNTER — Other Ambulatory Visit: Payer: Self-pay | Admitting: "Endocrinology

## 2016-02-09 DIAGNOSIS — E1142 Type 2 diabetes mellitus with diabetic polyneuropathy: Secondary | ICD-10-CM | POA: Diagnosis not present

## 2016-02-09 DIAGNOSIS — L84 Corns and callosities: Secondary | ICD-10-CM | POA: Diagnosis not present

## 2016-02-09 DIAGNOSIS — B351 Tinea unguium: Secondary | ICD-10-CM | POA: Diagnosis not present

## 2016-02-11 ENCOUNTER — Other Ambulatory Visit: Payer: Self-pay | Admitting: Family Medicine

## 2016-02-13 ENCOUNTER — Other Ambulatory Visit: Payer: Self-pay | Admitting: Family Medicine

## 2016-02-16 DIAGNOSIS — E782 Mixed hyperlipidemia: Secondary | ICD-10-CM | POA: Diagnosis not present

## 2016-02-16 DIAGNOSIS — Z794 Long term (current) use of insulin: Secondary | ICD-10-CM | POA: Diagnosis not present

## 2016-02-16 DIAGNOSIS — I10 Essential (primary) hypertension: Secondary | ICD-10-CM | POA: Diagnosis not present

## 2016-02-16 DIAGNOSIS — Z0000 Encounter for general adult medical examination without abnormal findings: Secondary | ICD-10-CM | POA: Diagnosis not present

## 2016-02-16 DIAGNOSIS — Z7982 Long term (current) use of aspirin: Secondary | ICD-10-CM | POA: Diagnosis not present

## 2016-02-16 DIAGNOSIS — E669 Obesity, unspecified: Secondary | ICD-10-CM | POA: Diagnosis not present

## 2016-02-16 DIAGNOSIS — M62838 Other muscle spasm: Secondary | ICD-10-CM | POA: Diagnosis not present

## 2016-02-16 DIAGNOSIS — K219 Gastro-esophageal reflux disease without esophagitis: Secondary | ICD-10-CM | POA: Diagnosis not present

## 2016-02-16 DIAGNOSIS — M4800 Spinal stenosis, site unspecified: Secondary | ICD-10-CM | POA: Diagnosis not present

## 2016-02-16 DIAGNOSIS — Z79891 Long term (current) use of opiate analgesic: Secondary | ICD-10-CM | POA: Diagnosis not present

## 2016-02-16 DIAGNOSIS — R600 Localized edema: Secondary | ICD-10-CM | POA: Diagnosis not present

## 2016-02-16 DIAGNOSIS — J309 Allergic rhinitis, unspecified: Secondary | ICD-10-CM | POA: Diagnosis not present

## 2016-02-16 DIAGNOSIS — N3942 Incontinence without sensory awareness: Secondary | ICD-10-CM | POA: Diagnosis not present

## 2016-02-16 DIAGNOSIS — E039 Hypothyroidism, unspecified: Secondary | ICD-10-CM | POA: Diagnosis not present

## 2016-02-16 DIAGNOSIS — E119 Type 2 diabetes mellitus without complications: Secondary | ICD-10-CM | POA: Diagnosis not present

## 2016-02-16 DIAGNOSIS — I499 Cardiac arrhythmia, unspecified: Secondary | ICD-10-CM | POA: Diagnosis not present

## 2016-02-16 DIAGNOSIS — R69 Illness, unspecified: Secondary | ICD-10-CM | POA: Diagnosis not present

## 2016-02-21 ENCOUNTER — Other Ambulatory Visit: Payer: Self-pay | Admitting: Family Medicine

## 2016-02-29 ENCOUNTER — Telehealth: Payer: Self-pay | Admitting: Family Medicine

## 2016-02-29 NOTE — Telephone Encounter (Signed)
Ms. Defoor Is asking to speak to the Nurse she has questions, please advise?

## 2016-03-02 NOTE — Telephone Encounter (Signed)
Patient would like to discuss pain control with pcp.   Appointment scheduled.

## 2016-03-08 ENCOUNTER — Ambulatory Visit (INDEPENDENT_AMBULATORY_CARE_PROVIDER_SITE_OTHER): Payer: Medicare HMO | Admitting: Family Medicine

## 2016-03-08 ENCOUNTER — Encounter: Payer: Self-pay | Admitting: Family Medicine

## 2016-03-08 VITALS — BP 150/72 | HR 62 | Resp 15 | Ht 62.0 in | Wt 176.0 lb

## 2016-03-08 DIAGNOSIS — E1121 Type 2 diabetes mellitus with diabetic nephropathy: Secondary | ICD-10-CM | POA: Diagnosis not present

## 2016-03-08 DIAGNOSIS — F419 Anxiety disorder, unspecified: Secondary | ICD-10-CM

## 2016-03-08 DIAGNOSIS — M542 Cervicalgia: Secondary | ICD-10-CM | POA: Diagnosis not present

## 2016-03-08 DIAGNOSIS — M5440 Lumbago with sciatica, unspecified side: Secondary | ICD-10-CM

## 2016-03-08 DIAGNOSIS — I10 Essential (primary) hypertension: Secondary | ICD-10-CM | POA: Diagnosis not present

## 2016-03-08 DIAGNOSIS — F418 Other specified anxiety disorders: Secondary | ICD-10-CM

## 2016-03-08 DIAGNOSIS — M4802 Spinal stenosis, cervical region: Secondary | ICD-10-CM | POA: Diagnosis not present

## 2016-03-08 DIAGNOSIS — R69 Illness, unspecified: Secondary | ICD-10-CM | POA: Diagnosis not present

## 2016-03-08 DIAGNOSIS — F329 Major depressive disorder, single episode, unspecified: Secondary | ICD-10-CM

## 2016-03-08 DIAGNOSIS — F32A Depression, unspecified: Secondary | ICD-10-CM

## 2016-03-08 MED ORDER — PREDNISONE 5 MG PO TABS: 5.0000 mg | ORAL_TABLET | Freq: Two times a day (BID) | ORAL | 0 refills | Status: AC

## 2016-03-08 NOTE — Patient Instructions (Addendum)
F/u as before, call if you need me sooner  Prednisone sent in for 5 days  You are referred to pain clinic Increase gabapentin to 3 capsules 3 times daily as tolerated   Be careful not to fall!

## 2016-03-09 ENCOUNTER — Encounter: Payer: Self-pay | Admitting: Family Medicine

## 2016-03-09 NOTE — Assessment & Plan Note (Signed)
Controlled, no change in medication  

## 2016-03-09 NOTE — Assessment & Plan Note (Signed)
Managed by endo and well controlled [name] [name] is reminded of the importance of commitment to daily physical activity for 30 minutes or more, as able and the need to limit carbohydrate intake to 30 to 60 grams per meal to help with blood sugar control.   The need to take medication as prescribed, test blood sugar as directed, and to call between visits if there is a concern that blood sugar is uncontrolled is also discussed.   [name] [name] is reminded of the importance of daily foot exam, annual eye examination, and good blood sugar, blood pressure and cholesterol control.  Diabetic Labs Latest Ref Rng & Units 12/31/2015 10/29/2015 09/12/2015 06/03/2015 02/24/2015  HbA1c <5.7 % 6.9(H) - 7.2(H) 7.5(H) 7.3(H)  Microalbumin <2.0 mg/dL - - - - -  Micro/Creat Ratio 0.0 - 30.0 mg/g - - - - -  Chol 125 - 200 mg/dL - 157 151 - -  HDL >=46 mg/dL - 75 82 - -  Calc LDL <130 mg/dL - 67 52 - -  Triglycerides <150 mg/dL - 75 85 - -  Creatinine 0.60 - 0.88 mg/dL 1.15(H) - 1.48(H) 1.43(H) 1.13(H)   BP/Weight 03/08/2016 01/25/2016 01/24/2016 10/31/2015 10/11/2015 05/19/3435 03/06/7895  Systolic BP 847 841 282 081 388 719 597  Diastolic BP 72 72 74 60 67 66 50  Wt. (Lbs) 176 168 176 175.04 170 172 174  BMI 32.19 30.73 32.19 32.02 31.09 31.46 31.83   Foot/eye exam completion dates Latest Ref Rng & Units 05/12/2015 10/20/2014  Eye Exam No Retinopathy - No Retinopathy  Foot exam Order - - -  Foot Form Completion - Done -

## 2016-03-09 NOTE — Assessment & Plan Note (Signed)
Increased and uncontrolled neck and upper extremity pain with tingling in fingers, needs pin management to treat her chronic pain due to severe disease in entire spine. Will prescribe 5 day course of prednisone, and up titrate the gabapentin as tolerated until she is seen by pain management. Currently taking 200 mg gabapentin 3 times daily, she is to increase as tolerated to 300 mg 3 times daily Narcotic script remains unchanged

## 2016-03-09 NOTE — Progress Notes (Signed)
[name] [name]     MRN: [id number]      DOB: Oct 16, 1929   HPI [name] [name] is here with primary c/o severe and increased low back pain radiating down her legs, and increased neck pain radiating down both hands with tingling in the fingers. 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 She is having uncontrolled and worsening pain and symptoms of neuropathy, and  Will benefit from  And needs  the expertise of pain management for care, she is agreeable to this and a referral is being sent on an urgent basis. She has been managed through me for chronic pain needs, has a contract in place which she adheres to, however, once she establishes with pain management , this will no longer be in place, she understands and agrees  ROS: See HPI  Denies recent fever or chills. Denies sinus pressure, nasal congestion, ear pain or sore throat. Denies chest congestion, productive cough or wheezing. Denies chest pains, palpitations and leg swelling Denies abdominal pain, nausea, vomiting,diarrhea or constipation.   Denies dysuria, frequency, hesitancy or incontinence. . Denies uncointrolled depression, anxiety or insomnia. Denies skin break down or rash.   PE  BP (!) 150/72   Pulse 62   Resp 15   Ht 5\' 2"  (1.575 m)   Wt 176 lb (79.8 kg)   SpO2 96%   BMI 32.19 kg/m   Patient alert and oriented and in no cardiopulmonary distress.Pt in pain  HEENT: No facial asymmetry, EOMI,   oropharynx pink and moist.  Neck decreased ROM no JVD, no mass.  Chest: Clear to auscultation bilaterally.  CVS: S1, S2 soft sytolic  murmurs, no S3.Regular rate.  ABD: Soft non tender.   Ext: No edema  MS: Decreased  ROM spine, shoulders, hips and knees.  Skin: Intact, no  ulcerations or rash noted.  Psych: Good eye contact, normal affect. Memory intact not anxious or depressed appearing.  CNS: CN 2-12 intact  Assessment & Plan  Spinal stenosis in cervical region Increased and uncontrolled neck and upper extremity pain with tingling in fingers, needs pin management to treat her chronic pain due to severe disease in entire spine. Will prescribe 5 day course of prednisone, and up titrate the gabapentin as tolerated until she is seen by pain management. Currently taking 200 mg gabapentin 3 times daily, she is to increase as tolerated to 300 mg 3 times daily Narcotic script remains unchanged  Back pain of lumbar region with sciatica Increased and uncontrolled pain with established severe spine and disc disease with nerve compression, refer to pain clinic for ongoing management  Anxiety and depression Controlled, no change in medication   Diabetes mellitus with nephropathy Managed by endo and well controlled [name] [name] is reminded of the importance of commitment to daily physical activity for 30 minutes or more, as able and the need to limit carbohydrate intake to 30 to 60 grams per meal to help with blood sugar control.   The need to take medication as prescribed, test blood sugar as directed, and to call between visits if there is a concern that blood sugar is uncontrolled is also discussed.   [name] [name] is reminded of the importance of daily foot exam, annual eye examination, and good blood sugar, blood pressure  and cholesterol control.  Diabetic Labs Latest Ref Rng & Units 12/31/2015 10/29/2015 09/12/2015 06/03/2015 02/24/2015  HbA1c <5.7 % 6.9(H) - 7.2(H) 7.5(H) 7.3(H)  Microalbumin <2.0 mg/dL - - - - -  Micro/Creat Ratio 0.0 - 30.0 mg/g - - - - -  Chol 125 - 200 mg/dL - 157 151 - -  HDL >=46 mg/dL - 75 82 - -  Calc LDL <130 mg/dL - 67 52 - -  Triglycerides <150 mg/dL - 75 85 - -  Creatinine 0.60 - 0.88 mg/dL 1.15(H) - 1.48(H) 1.43(H) 1.13(H)   BP/Weight  03/08/2016 01/25/2016 01/24/2016 10/31/2015 10/11/2015 3/83/3383 02/09/1914  Systolic BP 606 004 599 774 142 395 320  Diastolic BP 72 72 74 60 67 66 50  Wt. (Lbs) 176 168 176 175.04 170 172 174  BMI 32.19 30.73 32.19 32.02 31.09 31.46 31.83   Foot/eye exam completion dates Latest Ref Rng & Units 05/12/2015 10/20/2014  Eye Exam No Retinopathy - No Retinopathy  Foot exam Order - - -  Foot Form Completion - Done -        Essential hypertension Controlled, no change in medication

## 2016-03-09 NOTE — Assessment & Plan Note (Signed)
Increased and uncontrolled pain with established severe spine and disc disease with nerve compression, refer to pain clinic for ongoing management

## 2016-03-13 ENCOUNTER — Ambulatory Visit: Payer: Medicare HMO | Admitting: Family Medicine

## 2016-03-13 ENCOUNTER — Other Ambulatory Visit: Payer: Self-pay

## 2016-03-13 DIAGNOSIS — R69 Illness, unspecified: Secondary | ICD-10-CM | POA: Diagnosis not present

## 2016-03-13 MED ORDER — GLUCOSE BLOOD VI STRP
ORAL_STRIP | 5 refills | Status: AC
Start: 2016-03-13 — End: ?

## 2016-03-14 ENCOUNTER — Other Ambulatory Visit: Payer: Self-pay | Admitting: "Endocrinology

## 2016-03-26 ENCOUNTER — Telehealth: Payer: Self-pay

## 2016-03-26 NOTE — Telephone Encounter (Signed)
States she has been waiting but has not heard anything regarding pain referral and wants to know what is going on?

## 2016-04-05 ENCOUNTER — Telehealth: Payer: Self-pay

## 2016-04-05 NOTE — Telephone Encounter (Signed)
States when she wakes up in the am her hands are tingling and tender, especially the pads of her fingers feel raw. Wants something sent in to help

## 2016-04-06 ENCOUNTER — Telehealth: Payer: Self-pay

## 2016-04-06 NOTE — Telephone Encounter (Signed)
Wants to let you know something about her referral to the [facility]

## 2016-04-06 NOTE — Telephone Encounter (Signed)
pls contact pt, advise her that since she reported  Yesterday that she still has npo appt  Scheduled with pain clinic as we had arranged, ( states she lost 2 family members) we will need to see her next week Thursday for her ongoing pain management, pls give her an appt time The pain I believe is nerve pain, unfortunately no new / additional options from me for this.

## 2016-04-06 NOTE — Telephone Encounter (Signed)
Pain Clinic is waiting for [name] [name] to fill out paperwork

## 2016-04-09 NOTE — Telephone Encounter (Signed)
The pain clinic is waiting for her to fill out paperwork

## 2016-04-13 NOTE — Telephone Encounter (Signed)
Has appt 4/26

## 2016-04-19 DIAGNOSIS — B351 Tinea unguium: Secondary | ICD-10-CM | POA: Diagnosis not present

## 2016-04-19 DIAGNOSIS — M79671 Pain in right foot: Secondary | ICD-10-CM | POA: Diagnosis not present

## 2016-04-19 DIAGNOSIS — E1142 Type 2 diabetes mellitus with diabetic polyneuropathy: Secondary | ICD-10-CM | POA: Diagnosis not present

## 2016-04-19 DIAGNOSIS — L84 Corns and callosities: Secondary | ICD-10-CM | POA: Diagnosis not present

## 2016-04-20 DIAGNOSIS — G894 Chronic pain syndrome: Secondary | ICD-10-CM | POA: Diagnosis not present

## 2016-04-20 DIAGNOSIS — Z79891 Long term (current) use of opiate analgesic: Secondary | ICD-10-CM | POA: Diagnosis not present

## 2016-04-20 DIAGNOSIS — M5416 Radiculopathy, lumbar region: Secondary | ICD-10-CM | POA: Diagnosis not present

## 2016-04-20 DIAGNOSIS — M47814 Spondylosis without myelopathy or radiculopathy, thoracic region: Secondary | ICD-10-CM | POA: Diagnosis not present

## 2016-04-20 DIAGNOSIS — M47812 Spondylosis without myelopathy or radiculopathy, cervical region: Secondary | ICD-10-CM | POA: Diagnosis not present

## 2016-04-20 DIAGNOSIS — M549 Dorsalgia, unspecified: Secondary | ICD-10-CM | POA: Diagnosis not present

## 2016-04-20 DIAGNOSIS — M542 Cervicalgia: Secondary | ICD-10-CM | POA: Diagnosis not present

## 2016-04-20 DIAGNOSIS — M159 Polyosteoarthritis, unspecified: Secondary | ICD-10-CM | POA: Diagnosis not present

## 2016-04-20 DIAGNOSIS — M5030 Other cervical disc degeneration, unspecified cervical region: Secondary | ICD-10-CM | POA: Diagnosis not present

## 2016-04-20 DIAGNOSIS — N882 Stricture and stenosis of cervix uteri: Secondary | ICD-10-CM | POA: Diagnosis not present

## 2016-04-20 DIAGNOSIS — G8929 Other chronic pain: Secondary | ICD-10-CM | POA: Diagnosis not present

## 2016-04-21 ENCOUNTER — Other Ambulatory Visit: Payer: Self-pay | Admitting: "Endocrinology

## 2016-04-21 DIAGNOSIS — E782 Mixed hyperlipidemia: Secondary | ICD-10-CM | POA: Diagnosis not present

## 2016-04-21 DIAGNOSIS — I10 Essential (primary) hypertension: Secondary | ICD-10-CM | POA: Diagnosis not present

## 2016-04-21 DIAGNOSIS — E1121 Type 2 diabetes mellitus with diabetic nephropathy: Secondary | ICD-10-CM | POA: Diagnosis not present

## 2016-04-21 DIAGNOSIS — E039 Hypothyroidism, unspecified: Secondary | ICD-10-CM | POA: Diagnosis not present

## 2016-04-22 DIAGNOSIS — R69 Illness, unspecified: Secondary | ICD-10-CM | POA: Diagnosis not present

## 2016-04-22 LAB — COMPREHENSIVE METABOLIC PANEL
ALBUMIN: 4.2 g/dL (ref 3.6–5.1)
ALT: 15 U/L (ref 6–29)
AST: 21 U/L (ref 10–35)
Alkaline Phosphatase: 35 U/L (ref 33–130)
BUN: 55 mg/dL — ABNORMAL HIGH (ref 7–25)
CALCIUM: 9.4 mg/dL (ref 8.6–10.4)
CO2: 27 mmol/L (ref 20–31)
CREATININE: 2.34 mg/dL — AB (ref 0.60–0.88)
Chloride: 103 mmol/L (ref 98–110)
GLUCOSE: 137 mg/dL — AB (ref 65–99)
Potassium: 5.2 mmol/L (ref 3.5–5.3)
SODIUM: 141 mmol/L (ref 135–146)
Total Bilirubin: 0.4 mg/dL (ref 0.2–1.2)
Total Protein: 6.9 g/dL (ref 6.1–8.1)

## 2016-04-22 LAB — T4, FREE: FREE T4: 1.1 ng/dL (ref 0.8–1.8)

## 2016-04-22 LAB — LIPID PANEL
Cholesterol: 151 mg/dL (ref <200)
HDL: 73 mg/dL (ref >50)
LDL Cholesterol: 58 mg/dL (ref <100)
Total CHOL/HDL Ratio: 2.1 Ratio (ref <5.0)
Triglycerides: 100 mg/dL (ref <150)
VLDL: 20 mg/dL (ref <30)

## 2016-04-22 LAB — TSH: TSH: 2.94 mIU/L

## 2016-04-23 ENCOUNTER — Telehealth: Payer: Self-pay | Admitting: Family Medicine

## 2016-04-23 LAB — HEMOGLOBIN A1C
HEMOGLOBIN A1C: 7 % — AB (ref <5.7)
MEAN PLASMA GLUCOSE: 154 mg/dL

## 2016-04-23 NOTE — Telephone Encounter (Signed)
pls send to Dr Osker Mason office in [place], pain specialist for his office note on this pt, I spoke with him at lunchtime, this is he pt he wanted to speak with me about and He has an office note that I am requesting about a visit she recently had with him, thanks. The tele number there [phone]  ?? pls ask!

## 2016-04-24 ENCOUNTER — Telehealth: Payer: Self-pay | Admitting: "Endocrinology

## 2016-04-24 NOTE — Telephone Encounter (Signed)
[name] [name] is asking for a refill on glucose blood (ONETOUCH VERIO) test strip and NOVOLIN 70/30 RELION (70-30) 100 UNIT/ML injection, please advise

## 2016-04-26 ENCOUNTER — Encounter: Payer: Self-pay | Admitting: Family Medicine

## 2016-04-26 ENCOUNTER — Ambulatory Visit (INDEPENDENT_AMBULATORY_CARE_PROVIDER_SITE_OTHER): Payer: Medicare HMO | Admitting: "Endocrinology

## 2016-04-26 ENCOUNTER — Ambulatory Visit (INDEPENDENT_AMBULATORY_CARE_PROVIDER_SITE_OTHER): Payer: Medicare HMO | Admitting: Family Medicine

## 2016-04-26 ENCOUNTER — Encounter: Payer: Self-pay | Admitting: "Endocrinology

## 2016-04-26 VITALS — BP 144/62 | HR 65 | Resp 16 | Ht 62.0 in | Wt 184.0 lb

## 2016-04-26 VITALS — BP 133/67 | HR 61 | Ht 62.0 in | Wt 184.0 lb

## 2016-04-26 DIAGNOSIS — F32A Depression, unspecified: Secondary | ICD-10-CM

## 2016-04-26 DIAGNOSIS — E039 Hypothyroidism, unspecified: Secondary | ICD-10-CM

## 2016-04-26 DIAGNOSIS — I10 Essential (primary) hypertension: Secondary | ICD-10-CM

## 2016-04-26 DIAGNOSIS — E782 Mixed hyperlipidemia: Secondary | ICD-10-CM

## 2016-04-26 DIAGNOSIS — F329 Major depressive disorder, single episode, unspecified: Secondary | ICD-10-CM | POA: Diagnosis not present

## 2016-04-26 DIAGNOSIS — R52 Pain, unspecified: Secondary | ICD-10-CM | POA: Diagnosis not present

## 2016-04-26 DIAGNOSIS — R69 Illness, unspecified: Secondary | ICD-10-CM | POA: Diagnosis not present

## 2016-04-26 DIAGNOSIS — E1121 Type 2 diabetes mellitus with diabetic nephropathy: Secondary | ICD-10-CM

## 2016-04-26 DIAGNOSIS — F419 Anxiety disorder, unspecified: Secondary | ICD-10-CM

## 2016-04-26 MED ORDER — INSULIN NPH ISOPHANE & REGULAR (70-30) 100 UNIT/ML ~~LOC~~ SUSP: SUBCUTANEOUS | 2 refills | Status: AC

## 2016-04-26 MED ORDER — OXYCODONE-ACETAMINOPHEN 10-325 MG PO TABS: ORAL_TABLET | ORAL | 0 refills | Status: DC

## 2016-04-26 MED ORDER — GABAPENTIN 100 MG PO CAPS: ORAL_CAPSULE | ORAL | 3 refills | Status: AC

## 2016-04-26 MED ORDER — LEVOTHYROXINE SODIUM 75 MCG PO TABS: 75.0000 ug | ORAL_TABLET | Freq: Every day | ORAL | 3 refills | Status: DC

## 2016-04-26 NOTE — Patient Instructions (Signed)
f/u July 18 , call if you need me sooner  I am sorry about your recent losses  I will ask the social Worker Amy to reach out to you as far as help with transportation to visits Ensure you drink 64 ounces water every day  Non fast chem 7 and eGFR next week Wednesday morning  You are referred to kidney specialist Dr Birdie Sons to evaluate your kidneys as you have chronic kidney disease, controlling your blood pressure and blood sugar will protect these. Only pain medication to be used is what is prescribed  REDUCE gabapentin to three 100 mg tablets twice daily, then two 100 mg tablets twice daily, this will reduce your fall risk

## 2016-04-26 NOTE — Patient Instructions (Signed)

## 2016-04-26 NOTE — Progress Notes (Signed)
Subjective:    Patient ID: [name] [name], female    DOB: [birth year], PCP Tula Nakayama, MD   Past Medical History:  Diagnosis Date  . Abdominal bloating   . Acute cystitis   . Anticoagulant long-term use    Coumadin  stopped...severe hematoma  from fall  . Aortic valve sclerosis    Ef 60 %...echo...july 2010   . Arthritis   . Atrial flutter ([place]) febuary 2008      atrial flutter... DC Cardioversion...successful... holding sinus as of january 2011 coumadin therapy...discontinued...severe hematoma secondary to fall  . Back pain    with radiculopathy  . Basal cell carcinoma 01/2015   nose  . Carotid artery disease ([place])    Doppler, [facility], February 21, 2012, less than 50% bilateral carotid stenoses  . Chronic granulomatous disease ([place])    right chest ... ct chest... novmber 2009  . Constipation   . Depression with anxiety   . Diabetes mellitus    type 2  . Diverticulosis of colon   . Edema    Painful, April, 2012  . Ejection fraction    EF 60-65%, echo, April 13, 2010, normal RV function, mild mitral regurgitation  . Erosive esophagitis   . Fatigue   . GERD (gastroesophageal reflux disease)   . Hematochezia   . Hematoma    ...buttocks secondary to fall november 2009 with coumadin  coumadin stopped  . Hypercholesterolemia   . Hyperlipidemia   . Hypertension   . IBS (irritable bowel syndrome)   . Mitral regurgitation 2010   mild ...echo...2010  . Nausea   . Normal nuclear stress test    september ,2007...no ischemia  . Obesity   . Osteoarthritis   . Sleep apnea   . Visual loss    unspecified   Past Surgical History:  Procedure Laterality Date  . ABDOMINAL HYSTERECTOMY  2001  . APPENDECTOMY  1948  . BASAL CELL CARCINOMA EXCISION  09/2009   right forarm and back of neck and left side   . bilateral bunion removel  [id number]  . CATARACT EXTRACTION  2001   right   . CATARACT EXTRACTION     left 2005  . COLONOSCOPY   07/17/2006   Dr. Rourk:anal  canal hemorrhoids, diminutive rectal polyp status post cold biopsy.  Otherwise, normal rectum. Left sided and  transverse diverticula.  Diminutive polyp in the ascending colon, cold bx. hyperplastic polyps.   Marland Kitchen [place] and 2001  . ESOPHAGOGASTRODUODENOSCOPY   07/17/2006   Dr. Rourk:single 2 cm distal esophageal erosion consistent with erosive reflux esophagitis, otherwise normal  . ESOPHAGOGASTRODUODENOSCOPY N/A 09/10/2013   Dr. Rourk:Schatzki's ring - status post Venia Minks dilation. Mild erosive reflux esophagitis. Hiatal hernia with fosamax capsule lodged as above  . MALONEY DILATION N/A 09/10/2013   Procedure: Venia Minks DILATION;  Surgeon: Daneil Dolin, MD;  Location: [facility];  Service: Endoscopy;  Laterality: N/A;  . OTHER SURGICAL HISTORY    . REPLACEMENT TOTAL KNEE BILATERAL     2000,2001  . SAVORY DILATION N/A 09/10/2013   Procedure: SAVORY DILATION;  Surgeon: Daneil Dolin, MD;  Location: [facility];  Service: Endoscopy;  Laterality: N/A;  . TONSILLECTOMY  1957   Social History   Social History  . Marital status: Married    Spouse name: N/A  . Number of children: N/A  . Years of education: 64   Occupational History  . retired Retired   Science writer History  Main Topics  . Smoking status: Never Smoker  . Smokeless tobacco: Never Used  . Alcohol use No  . Drug use: No  . Sexual activity: Not Currently   Other Topics Concern  . None   Social History Narrative  . None   Outpatient Encounter Prescriptions as of 04/26/2016  Medication Sig  . aspirin 81 MG tablet Take 81 mg by mouth daily.    . benazepril-hydrochlorthiazide (LOTENSIN HCT) 20-12.5 MG tablet TAKE 2 TABLETS BY MOUTH DAILY  . calcium carbonate (OSCAL) 1500 (600 CA) MG TABS tablet Take 2 tablets by mouth 1 day or 1 dose.  . citalopram (CELEXA) 40 MG tablet TAKE ONE TABLET BY MOUTH ONCE DAILY  . diclofenac sodium (VOLTAREN) 1 % GEL Apply topically 4 (four) times daily.  Marland Kitchen DILT-XR  240 MG 24 hr capsule TAKE 1 CAPSULE BY MOUTH DAILY  . docusate sodium (COLACE) 100 MG capsule Take 100 mg by mouth daily.  . fenofibrate (TRICOR) 145 MG tablet TAKE 1 TABLET BY MOUTH AT BEDTIME  . fish oil-omega-3 fatty acids 1000 MG capsule Take 1 g by mouth daily. 2 caps bid   . fluticasone (FLONASE) 50 MCG/ACT nasal spray USE TWO SPRAY(S) IN EACH NOSTRIL ONCE DAILY  . furosemide (LASIX) 40 MG tablet Take 1 tablet (40 mg total) by mouth daily. (Patient taking differently: Take 40 mg by mouth daily. As needed)  . gabapentin (NEURONTIN) 100 MG capsule TAKE TWO CAPSULES BY MOUTH THREE TIMES DAILY  . glucose blood (ONETOUCH VERIO) test strip Use as instructed tid. E11.65  . insulin NPH-regular Human (NOVOLIN 70/30 RELION) (70-30) 100 UNIT/ML injection INJECT 15 UNITS SUBCUTANEOUSLY TWICE DAILY WITH  A  MEAL  . L-Methylfolate-Algae-B12-B6 (METANX) 3-90.314-2-35 MG CAPS Take 1 capsule by mouth daily.  Marland Kitchen levothyroxine (SYNTHROID, LEVOTHROID) 75 MCG tablet Take 1 tablet (75 mcg total) by mouth daily before breakfast.  . Linaclotide (LINZESS) 145 MCG CAPS capsule Take 145 mcg by mouth daily as needed.  . Multiple Vitamin (MULTIVITAMIN) tablet Take 1 tablet by mouth daily.  Marland Kitchen oxyCODONE-acetaminophen (PERCOCET) 10-325 MG tablet One tablet twice daily  . pantoprazole (PROTONIX) 40 MG tablet Take 1 tablet (40 mg total) by mouth daily.  . ranitidine (ZANTAC) 150 MG tablet TAKE ONE TABLET BY MOUTH AT BEDTIME  . rosuvastatin (CRESTOR) 40 MG tablet TAKE ONE TABLET BY MOUTH ONCE DAILY  . [DISCONTINUED] levothyroxine (SYNTHROID, LEVOTHROID) 50 MCG tablet TAKE ONE TABLET BY MOUTH ONCE DAILY  . [DISCONTINUED] NOVOLIN 70/30 RELION (70-30) 100 UNIT/ML injection INJECT 20 UNITS SUBCUTANEOUSLY TWICE DAILY WITH  A  MEAL   No facility-administered encounter medications on file as of 04/26/2016.    ALLERGIES: Allergies  Allergen Reactions  . Lisinopril Nausea And Vomiting  . Reclast [Zoledronic Acid] Other (See  Comments)    Jaw pain, and possible deterioration in kidney function  . Alprazolam Other (See Comments)    hyper  . Losartan Other (See Comments)    Raw throat  . Sertraline Hcl Diarrhea  . Sulfonamide Derivatives Rash   VACCINATION STATUS: Immunization History  Administered Date(s) Administered  . H1N1 11/04/2007  . Influenza Split 10/12/2010, 09/28/2011  . Influenza Whole 10/25/2005, 11/01/2008, 10/04/2009  . Influenza,inj,Quad PF,36+ Mos 11/10/2012, 10/29/2013, 09/28/2014, 10/31/2015  . Pneumococcal Conjugate-13 05/04/2014  . Pneumococcal Polysaccharide-23 05/27/2003  . Td 10/04/2009  . Zoster 03/25/2006    Diabetes  She presents for her follow-up diabetic visit. She has type 2 diabetes mellitus. Onset time: She was diagnosed at approximate age of 84  years. Her disease course has been improving. There are no hypoglycemic associated symptoms. Pertinent negatives for hypoglycemia include no confusion, headaches, pallor or seizures. Pertinent negatives for diabetes include no chest pain, no polydipsia, no polyphagia and no polyuria. There are no hypoglycemic complications. Symptoms are improving. Diabetic complications include nephropathy. Risk factors for coronary artery disease include diabetes mellitus, dyslipidemia, hypertension and sedentary lifestyle. When asked about current treatments, none were reported. She is compliant with treatment some of the time. Her weight is stable. She is following a generally unhealthy diet. She has not had a previous visit with a dietitian. She never participates in exercise. Her breakfast blood glucose range is generally 140-180 mg/dl. Her dinner blood glucose range is generally 140-180 mg/dl. Her overall blood glucose range is 140-180 mg/dl. An ACE inhibitor/angiotensin II receptor blocker is being taken. Eye exam is current.  Thyroid Problem  Presents for initial visit. Onset time: she is being diagnosed with primary hypothyroidism today. Patient  reports no cold intolerance, diarrhea, heat intolerance or palpitations. Past treatments include nothing. The following procedures have not been performed: radioiodine uptake scan, thyroid FNA, thyroid ultrasound and thyroidectomy. Her past medical history is significant for hyperlipidemia.  Hyperlipidemia  This is a chronic problem. The current episode started more than 1 year ago. Pertinent negatives include no chest pain, myalgias or shortness of breath. Current antihyperlipidemic treatment includes statins.  Hypertension  This is a chronic problem. The current episode started more than 1 year ago. The problem is controlled. Pertinent negatives include no chest pain, headaches, palpitations or shortness of breath. Past treatments include ACE inhibitors. Identifiable causes of hypertension include a thyroid problem.     Review of Systems  Constitutional: Negative for unexpected weight change.  HENT: Negative for trouble swallowing and voice change.   Eyes: Negative for visual disturbance.  Respiratory: Negative for cough, shortness of breath and wheezing.   Cardiovascular: Negative for chest pain, palpitations and leg swelling.  Gastrointestinal: Negative for diarrhea, nausea and vomiting.  Endocrine: Negative for cold intolerance, heat intolerance, polydipsia, polyphagia and polyuria.  Musculoskeletal: Positive for arthralgias and back pain. Negative for myalgias.  Skin: Negative for color change, pallor, rash and wound.  Neurological: Negative for seizures and headaches.  Psychiatric/Behavioral: Negative for confusion and suicidal ideas.    Objective:    BP 133/67   Pulse 61   Ht 5\' 2"  (1.575 m)   Wt 184 lb (83.5 kg)   BMI 33.65 kg/m   Wt Readings from Last 3 Encounters:  04/26/16 184 lb (83.5 kg)  03/08/16 176 lb (79.8 kg)  01/25/16 168 lb (76.2 kg)    Physical Exam  Constitutional: She is oriented to person, place, and time. She appears well-developed.  Walks with her  walker.  HENT:  Head: Normocephalic and atraumatic.  Eyes: EOM are normal.  Neck: Normal range of motion. Neck supple. No tracheal deviation present. No thyromegaly present.  Cardiovascular: Normal rate and regular rhythm.   Pulmonary/Chest: Effort normal and breath sounds normal.  Abdominal: Soft. Bowel sounds are normal. There is no tenderness. There is no guarding.  Musculoskeletal: She exhibits no edema.  Walks with her walker.  Neurological: She is alert and oriented to person, place, and time. She has normal reflexes. No cranial nerve deficit. Coordination normal.  Skin: Skin is warm and dry. No rash noted. No erythema. No pallor.  Psychiatric: She has a normal mood and affect. Judgment normal.    Results for orders placed or performed in visit on  04/21/16  Comprehensive metabolic panel  Result Value Ref Range   Sodium 141 135 - 146 mmol/L   Potassium 5.2 3.5 - 5.3 mmol/L   Chloride 103 98 - 110 mmol/L   CO2 27 20 - 31 mmol/L   Glucose, Bld 137 (H) 65 - 99 mg/dL   BUN 55 (H) 7 - 25 mg/dL   Creat 2.34 (H) 0.60 - 0.88 mg/dL   Total Bilirubin 0.4 0.2 - 1.2 mg/dL   Alkaline Phosphatase 35 33 - 130 U/L   AST 21 10 - 35 U/L   ALT 15 6 - 29 U/L   Total Protein 6.9 6.1 - 8.1 g/dL   Albumin 4.2 3.6 - 5.1 g/dL   Calcium 9.4 8.6 - 10.4 mg/dL  TSH  Result Value Ref Range   TSH 2.94 mIU/L  T4, free  Result Value Ref Range   Free T4 1.1 0.8 - 1.8 ng/dL  Hemoglobin A1c  Result Value Ref Range   Hgb A1c MFr Bld 7.0 (H) <5.7 %   Mean Plasma Glucose 154 mg/dL   Complete Blood Count (Most recent): Lab Results  Component Value Date   WBC 8.3 10/29/2015   HGB 11.7 10/29/2015   HCT 36.2 10/29/2015   MCV 94.8 10/29/2015   PLT 186 10/29/2015   Chemistry (most recent): Lab Results  Component Value Date   NA 141 04/21/2016   K 5.2 04/21/2016   CL 103 04/21/2016   CO2 27 04/21/2016   BUN 55 (H) 04/21/2016   CREATININE 2.34 (H) 04/21/2016   Diabetic Labs (most recent): Lab  Results  Component Value Date   HGBA1C 7.0 (H) 04/21/2016   HGBA1C 6.9 (H) 12/31/2015   HGBA1C 7.2 (H) 09/12/2015   Lipid Panel     Component Value Date/Time   CHOL 151 04/21/2016 0813   TRIG 100 04/21/2016 0813   HDL 73 04/21/2016 0813   CHOLHDL 2.1 04/21/2016 0813   VLDL 20 04/21/2016 0813   LDLCALC 58 04/21/2016 0813     Assessment & Plan:   1. Diabetes mellitus with nephropathy ([place])  - Her diabetes is complicated by CKD, and she  remains at a high risk for more acute and chronic complications of diabetes which include CAD, CVA, CKD, retinopathy, and neuropathy. These are all discussed in detail with the patient.  Patient came with controlled glucose profile, and  recent A1c is  Stable at 7%, generally improving from  9.5 %. However she took 20 units of Novolin 70/30  twice a day instead of 15 units recommended.  Her blood glucose readings will be scanned into her medical records.  Recent labs reviewed.   - I have re-counseled the patient on diet management   by adopting a carbohydrate restricted / protein rich  Diet.  - Suggestion is made for patient to avoid simple carbohydrates   from their diet including Cakes , Desserts, Ice Cream,  Soda (  diet and regular) , Sweet Tea , Candies,  Chips, Cookies, Artificial Sweeteners,   and "Sugar-free" Products .  This will help patient to have stable blood glucose profile and potentially avoid unintended  Weight gain.  - Patient is advised to stick to a routine mealtimes to eat 3 meals  a day and avoid unnecessary snacks ( to snack only to correct hypoglycemia).  - I have approached patient with the following individualized plan to manage diabetes and patient agrees.  - Given the long duration of her diabetes, she will continue to need insulin  therapy, she did reasonably well on premixed Novolin 70/30. - I I advised her to lower her dose of   Novolin 70/30 to  15 units AC BREAKFAST and supper, associated with strict monitoring of  glucose  AC and HS. - Patient is warned not to take insulin without proper monitoring per orders. -Adjustment parameters are given for hypo and hyperglycemia in writing. -Patient is encouraged to call clinic for blood glucose levels less than 70 or above 300 mg /dl.  -Patient is not a candidate for MTF, SGLT2 i  due to CKD.  - Patient specific target  for A1c; LDL, HDL, Triglycerides, and  Waist Circumference were discussed in detail.  2) BP/HTN: controlled. Continue current medications including ACEI/ARB. 3) Lipids/HPL:  continue statins. 4)  Weight/Diet: CDE consult in progress, exercise, and carbohydrates information provided.  5) hypothyroidism:   She would benefit from slight increase in her levothyroxine.  I will increase  levothyroxine to 75 g by mouth every morning.  - We discussed about correct intake of levothyroxine, at fasting, with water, separated by at least 30 minutes from breakfast, and separated by more than 4 hours from calcium, iron, multivitamins, acid reflux medications (PPIs). -Patient is made aware of the fact that thyroid hormone replacement is needed for life, dose to be adjusted by periodic monitoring of thyroid function tests.  6) Chronic Care/Health Maintenance:  -Patient is on ACEI/ARB and Statin medications and encouraged to continue to follow up with Ophthalmology, Podiatrist at least yearly or according to recommendations, and advised to  stay away from smoking. I have recommended yearly flu vaccine and pneumonia vaccination at least every 5 years; moderate intensity exercise for up to 150 minutes weekly; and  sleep for at least 7 hours a day.  - 25 minutes of time was spent on the care of this patient , 50% of which was applied for counseling on diabetes complications and their preventions.  - I advised patient to maintain close follow up with Tula Nakayama, MD for primary care needs.  Patient is asked to bring meter and  blood glucose logs during their  next visit.   Follow up plan: -Return in about 3 months (around 07/26/2016) for follow up with pre-visit labs, meter, and logs.  Glade Lloyd, MD Phone: [phone]  Fax: [phone]   04/26/2016, 11:54 AM

## 2016-04-29 DIAGNOSIS — R52 Pain, unspecified: Secondary | ICD-10-CM | POA: Insufficient documentation

## 2016-04-29 NOTE — Assessment & Plan Note (Signed)
Adequate control, no med change DASH diet and commitment to daily physical activity for a minimum of 30 minutes discussed and encouraged, as a part of hypertension management. The importance of attaining a healthy weight is also discussed.  BP/Weight 04/26/2016 04/26/2016 03/08/2016 01/25/2016 01/24/2016 10/31/2015 94/85/4627  Systolic BP 035 009 381 829 937 169 678  Diastolic BP 62 67 72 72 74 60 67  Wt. (Lbs) 184 184 176 168 176 175.04 170  BMI 33.65 33.65 32.19 30.73 32.19 32.02 31.09

## 2016-04-29 NOTE — Assessment & Plan Note (Signed)
Recent pain consult reviewed also the registry. Pt to remain on current narcotic pain management. He will reduce gabapentin as not very beneficial and is increasing her imbalance

## 2016-04-29 NOTE — Progress Notes (Signed)
[name] [name]     MRN: [id number]      DOB: [birth year]   HPI [name] is here for follow up and re-evaluation of chronic medical conditions, medication management and review of any available recent lab and radiology data.  Preventive health is updated, specifically  Cancer screening and Immunization.   Recently saw a pain specialist who recommended that she go into a nursing home due to high fall risk, he actually called and spoke with me about her also, and also recommended she see a local Doc for pain management. She is tearful, unhappy with that visit based on the recommendation. One recommendation which I am following through on is reduction of gabapentin dose to reduce instability and fall risk and she is in agreement. She is upset because she states she never heard before that she had  Kidney disease and this scares her. Unfortunately, she had been t referred over 2 years ago to a local nephrologist and did not go, I explained that the dx was not new and had been attempted to be addressed in the past and that I was sorry that she actually heard it for the first time thi ay. She also had the unfortunate experience of losing a sister and a sister in law in the recent past, and all of these situations have compounded to make her extremely unhappy and confused, which I understandable. She struggles with caring for herself and her spouse is difficult, I will ask the social worker to reach out with a call to see if there is any help which may be afforded her in terms of transport to appointments   ROS Denies recent fever or chills. Denies sinus pressure, nasal congestion, ear pain or sore throat. Denies chest congestion, productive cough or wheezing. Denies chest pains, palpitations and leg swelling Denies abdominal pain, nausea, vomiting,diarrhea or constipation.   Denies dysuria, frequency, hesitancy or incontinence.  Denies headaches, seizures,  Denies depression, anxiety or  insomnia. Denies skin break down or rash.   PE  BP (!) 144/62   Pulse 65   Resp 16   Ht 5\' 2"  (1.575 m)   Wt 184 lb (83.5 kg)   SpO2 94%   BMI 33.65 kg/m   Patient alert and oriented and in no cardiopulmonary distress.  HEENT: No facial asymmetry, EOMI,   oropharynx pink and moist.  Neck supple no JVD, no mass.  Chest: Clear to auscultation bilaterally.  CVS: S1, S2 no murmurs, no S3.Regular rate.  ABD: Soft non tender.   Ext: No edema  MS: Decereased ROM spine, shoulders, hips and knees.  Skin: Intact, no ulcerations or rash noted.  Psych: Good eye contact, normal affect. Memory intact Tearful, anxious and depressed appearing.  CNS: CN 2-12 intact,  power,  normal throughout.no focal deficits noted.   Assessment & Plan  Anxiety and depression Currently increased and uncontrolled, not suicidal or homicidal. Recent losses, and increased health challenges, will ask social worker to help , no change in medication management  Essential hypertension Adequate control, no med change DASH diet and commitment to daily physical activity for a minimum of 30 minutes discussed and encouraged, as a part of hypertension management. The importance of attaining a healthy weight is also discussed.  BP/Weight 04/26/2016 04/26/2016 03/08/2016 01/25/2016 01/24/2016 10/31/2015 17/61/6073  Systolic BP 710 626 948 546 270 350 093  Diastolic BP 62 67 72 72 74 60 67  Wt. (Lbs) 184 184 176 168 176 175.04 170  BMI  33.65 33.65 32.19 30.73 32.19 32.02 31.09       Diabetes mellitus with nephropathy Managed by endo and controlled [name] [name] is reminded of the importance of commitment to daily physical activity for 30 minutes or more, as able and the need to limit carbohydrate intake to 30 to 60 grams per meal to help with blood sugar control.   The need to take medication as prescribed, test blood sugar as directed, and to call between visits if there is a concern that blood sugar is uncontrolled  is also discussed.   Ms. Hiley is reminded of the importance of daily foot exam, annual eye examination, and good blood sugar, blood pressure and cholesterol control.  Diabetic Labs Latest Ref Rng & Units 04/21/2016 12/31/2015 10/29/2015 09/12/2015 06/03/2015  HbA1c <5.7 % 7.0(H) 6.9(H) - 7.2(H) 7.5(H)  Microalbumin <2.0 mg/dL - - - - -  Micro/Creat Ratio 0.0 - 30.0 mg/g - - - - -  Chol <200 mg/dL 151 - 157 151 -  HDL >50 mg/dL 73 - 75 82 -  Calc LDL <100 mg/dL 58 - 67 52 -  Triglycerides <150 mg/dL 100 - 75 85 -  Creatinine 0.60 - 0.88 mg/dL 2.34(H) 1.15(H) - 1.48(H) 1.43(H)   BP/Weight 04/26/2016 04/26/2016 03/08/2016 01/25/2016 01/24/2016 10/31/2015 87/68/1157  Systolic BP 262 035 597 416 384 536 468  Diastolic BP 62 67 72 72 74 60 67  Wt. (Lbs) 184 184 176 168 176 175.04 170  BMI 33.65 33.65 32.19 30.73 32.19 32.02 31.09   Foot/eye exam completion dates Latest Ref Rng & Units 05/12/2015 10/20/2014  Eye Exam No Retinopathy - No Retinopathy  Foot exam Order - - -  Foot Form Completion - Done -        Encounter for pain management Recent pain consult reviewed also the registry. Pt to remain on current narcotic pain management. He will reduce gabapentin as not very beneficial and is increasing her imbalance

## 2016-04-29 NOTE — Assessment & Plan Note (Signed)
Currently increased and uncontrolled, not suicidal or homicidal. Recent losses, and increased health challenges, will ask social worker to help , no change in medication management

## 2016-04-29 NOTE — Assessment & Plan Note (Signed)
Managed by endo and controlled [name] [name] is reminded of the importance of commitment to daily physical activity for 30 minutes or more, as able and the need to limit carbohydrate intake to 30 to 60 grams per meal to help with blood sugar control.   The need to take medication as prescribed, test blood sugar as directed, and to call between visits if there is a concern that blood sugar is uncontrolled is also discussed.   [name] [name] is reminded of the importance of daily foot exam, annual eye examination, and good blood sugar, blood pressure and cholesterol control.  Diabetic Labs Latest Ref Rng & Units 04/21/2016 12/31/2015 10/29/2015 09/12/2015 06/03/2015  HbA1c <5.7 % 7.0(H) 6.9(H) - 7.2(H) 7.5(H)  Microalbumin <2.0 mg/dL - - - - -  Micro/Creat Ratio 0.0 - 30.0 mg/g - - - - -  Chol <200 mg/dL 151 - 157 151 -  HDL >50 mg/dL 73 - 75 82 -  Calc LDL <100 mg/dL 58 - 67 52 -  Triglycerides <150 mg/dL 100 - 75 85 -  Creatinine 0.60 - 0.88 mg/dL 2.34(H) 1.15(H) - 1.48(H) 1.43(H)   BP/Weight 04/26/2016 04/26/2016 03/08/2016 01/25/2016 01/24/2016 10/31/2015 59/56/3875  Systolic BP 643 329 518 841 660 630 160  Diastolic BP 62 67 72 72 74 60 67  Wt. (Lbs) 184 184 176 168 176 175.04 170  BMI 33.65 33.65 32.19 30.73 32.19 32.02 31.09   Foot/eye exam completion dates Latest Ref Rng & Units 05/12/2015 10/20/2014  Eye Exam No Retinopathy - No Retinopathy  Foot exam Order - - -  Foot Form Completion - Done -

## 2016-05-02 ENCOUNTER — Other Ambulatory Visit: Payer: Self-pay | Admitting: Family Medicine

## 2016-05-02 DIAGNOSIS — I10 Essential (primary) hypertension: Secondary | ICD-10-CM | POA: Diagnosis not present

## 2016-05-02 DIAGNOSIS — E162 Hypoglycemia, unspecified: Secondary | ICD-10-CM | POA: Diagnosis not present

## 2016-05-02 DIAGNOSIS — R69 Illness, unspecified: Secondary | ICD-10-CM | POA: Diagnosis not present

## 2016-05-02 DIAGNOSIS — E161 Other hypoglycemia: Secondary | ICD-10-CM | POA: Diagnosis not present

## 2016-05-02 LAB — BASIC METABOLIC PANEL WITH GFR
BUN: 33 mg/dL — ABNORMAL HIGH (ref 7–25)
CALCIUM: 9 mg/dL (ref 8.6–10.4)
CHLORIDE: 100 mmol/L (ref 98–110)
CO2: 29 mmol/L (ref 20–31)
Creat: 1.63 mg/dL — ABNORMAL HIGH (ref 0.60–0.88)
GFR, EST NON AFRICAN AMERICAN: 29 mL/min — AB (ref >=60)
GFR, Est African American: 33 mL/min — ABNORMAL LOW (ref >=60)
Glucose, Bld: 138 mg/dL — ABNORMAL HIGH (ref 65–99)
POTASSIUM: 4.6 mmol/L (ref 3.5–5.3)
SODIUM: 139 mmol/L (ref 135–146)

## 2016-05-08 ENCOUNTER — Telehealth: Payer: Self-pay

## 2016-05-08 NOTE — Telephone Encounter (Signed)
Called pt to make sure pt had transportation and support services. Patient said she was fine at this time. Gave her contact number and advised her she could call the office or my direct line if she had transportation questions for her appointments. -anr

## 2016-05-21 ENCOUNTER — Encounter: Payer: Self-pay | Admitting: Family Medicine

## 2016-05-21 ENCOUNTER — Ambulatory Visit (INDEPENDENT_AMBULATORY_CARE_PROVIDER_SITE_OTHER): Payer: Medicare HMO | Admitting: Family Medicine

## 2016-05-21 ENCOUNTER — Telehealth: Payer: Self-pay | Admitting: Family Medicine

## 2016-05-21 VITALS — BP 140/70 | HR 74 | Resp 16 | Ht 62.0 in | Wt 177.0 lb

## 2016-05-21 DIAGNOSIS — I10 Essential (primary) hypertension: Secondary | ICD-10-CM

## 2016-05-21 DIAGNOSIS — R42 Dizziness and giddiness: Secondary | ICD-10-CM

## 2016-05-21 DIAGNOSIS — I739 Peripheral vascular disease, unspecified: Secondary | ICD-10-CM | POA: Diagnosis not present

## 2016-05-21 DIAGNOSIS — E1121 Type 2 diabetes mellitus with diabetic nephropathy: Secondary | ICD-10-CM | POA: Diagnosis not present

## 2016-05-21 DIAGNOSIS — L97921 Non-pressure chronic ulcer of unspecified part of left lower leg limited to breakdown of skin: Secondary | ICD-10-CM | POA: Diagnosis not present

## 2016-05-21 MED ORDER — CEPHALEXIN 250 MG PO CAPS: 250.0000 mg | ORAL_CAPSULE | Freq: Three times a day (TID) | ORAL | 0 refills | Status: DC

## 2016-05-21 MED ORDER — MECLIZINE HCL 12.5 MG PO TABS: 12.5000 mg | ORAL_TABLET | Freq: Three times a day (TID) | ORAL | 0 refills | Status: DC | PRN

## 2016-05-21 NOTE — Telephone Encounter (Signed)
Do you see a work in opportunity for early this pm??? I would rather tomorrow before lunch if you think she is willing/ able to wait

## 2016-05-21 NOTE — Assessment & Plan Note (Signed)
One day h/o vertigo, short course of antivert and fall precautions, recommend alert bracwelet also, due to high f fall risk

## 2016-05-21 NOTE — Assessment & Plan Note (Addendum)
Unilateral leg swelling with vessel disease and weeping, refer for wound eval, soonest appt available as pt is diabetic and is therefore at increase risk of wound complication, also 5 day course of keflex prescribed

## 2016-05-21 NOTE — Telephone Encounter (Signed)
Coming today  

## 2016-05-21 NOTE — Progress Notes (Signed)
   [name] [name]     MRN: [id number]      DOB: 1929/11/09   HPI [name] [name] is herewith 5 day h/o left leg swelling , which started leaking clear fluid from the left leg this past 3 days, the swelling has gone down a lot but the leaking continues No trauma to the leg C/overtigo which started today C/o mild redness and pain to left lower leg, has not had this in the past, first episode  ROS Denies recent fever or chills. Denies sinus pressure, nasal congestion, ear pain or sore throat. Denies chest congestion, productive cough or wheezing. Denies chest pains, palpitations, PND or orthopnea Denies abdominal pain, nausea, vomiting,diarrhea or constipation.   Denies dysuria, frequency, hesitancy or incontinence. Denies uncontrolled  joint pain, swelling and limitation in mobility. Denies headaches, seizures,  Denies depression, anxiety or insomnia.    PE  BP 140/70   Pulse 74   Resp 16   Ht 5\' 2"  (1.575 m)   Wt 177 lb (80.3 kg)   SpO2 94%   BMI 32.37 kg/m   Patient alert and oriented and in no cardiopulmonary distress.  HEENT: No facial asymmetry, EOMI,   oropharynx pink and moist.  Neck supple no JVD, no mass.nO nystagmus Chest: Clear to auscultation bilaterally.  CVS: S1, S2 no murmurs, no S3.Regular rate.  ABD: Soft non tender.   Ext: 2 plus pitting edema of LLE with open ulcer which is weeping on LLE Mild erythema and warmth of LLE MS: decreased  ROM spine, shoulders, hips and knees.  Skin: Intact, no ulcerations or rash noted.  Psych: Good eye contact, normal affect. Memory intact not anxious or depressed appearing.  CNS: CN 2-12 intact, power,  normal throughout.no focal deficits noted.   Assessment & Plan Vertigo One day h/o vertigo, short course of antivert and fall precautions, recommend alert bracwelet also, due to high f fall risk  Peripheral vascular disease (HCC) Unilateral leg swelling with vessel disease and weeping, refer for wound eval, soonest appt  available as pt is diabetic and is therefore at increase risk of wound complication, also 5 day course of keflex prescribed  Essential hypertension Adequately controlled, no change in management  Leg ulcer, left, limited to breakdown of skin ([place]) Left leg ulcer which is weeping clear fluid x 5 days refer to wound center. Covered with dry dressing

## 2016-05-21 NOTE — Telephone Encounter (Signed)
Patient states she had some swelling last week in her legs, she scratched a dry scab on her leg and it has been leaking clear fluid since Saturday. Swelling has gone away but nothing else has changed the fluid is still leaking she says. She is requesting an appt today, states she doesn't feel well either.  Cb#: [phone]

## 2016-05-21 NOTE — Patient Instructions (Signed)
F/u as before   Call if you need me sooner  [place] extremity circulation due to weeping and skin breakdown   Keflex, an antibiotic is prescribed for 5 days.  Keep legs elevated  Please get a fall alert  Necklace since you are at increased fall risk from severe arthritis  Antivert is prescribed for vertigo  Telephone number for kidney Specialist is provided  Thank you  for choosing [facility]. We consider it a privelige to serve you.  Delivering excellent health care in a caring and  compassionate way is our goal.  Partnering with you,  so that together we can achieve this goal is our strategy.

## 2016-05-22 DIAGNOSIS — L97921 Non-pressure chronic ulcer of unspecified part of left lower leg limited to breakdown of skin: Secondary | ICD-10-CM | POA: Insufficient documentation

## 2016-05-22 NOTE — Assessment & Plan Note (Signed)
Left leg ulcer which is weeping clear fluid x 5 days refer to wound center. Covered with dry dressing

## 2016-05-22 NOTE — Assessment & Plan Note (Signed)
Adequate;ly controlled, no change in management 

## 2016-05-22 NOTE — Assessment & Plan Note (Signed)
Controlled, no change in medication [name] [name] is reminded of the importance of commitment to daily physical activity for 30 minutes or more, as able and the need to limit carbohydrate intake to 30 to 60 grams per meal to help with blood sugar control.   The need to take medication as prescribed, test blood sugar as directed, and to call between visits if there is a concern that blood sugar is uncontrolled is also discussed.   [name] [name] is reminded of the importance of daily foot exam, annual eye examination, and good blood sugar, blood pressure and cholesterol control.  Diabetic Labs Latest Ref Rng & Units 05/02/2016 04/21/2016 12/31/2015 10/29/2015 09/12/2015  HbA1c <5.7 % - 7.0(H) 6.9(H) - 7.2(H)  Microalbumin <2.0 mg/dL - - - - -  Micro/Creat Ratio 0.0 - 30.0 mg/g - - - - -  Chol <200 mg/dL - 151 - 157 151  HDL >50 mg/dL - 73 - 75 82  Calc LDL <100 mg/dL - 58 - 67 52  Triglycerides <150 mg/dL - 100 - 75 85  Creatinine 0.60 - 0.88 mg/dL 1.63(H) 2.34(H) 1.15(H) - 1.48(H)   BP/Weight 05/21/2016 04/26/2016 04/26/2016 03/08/2016 01/25/2016 01/24/2016 10/28/2534  Systolic BP 644 034 742 595 638 756 433  Diastolic BP 70 62 67 72 72 74 60  Wt. (Lbs) 177 184 184 176 168 176 175.04  BMI 32.37 33.65 33.65 32.19 30.73 32.19 32.02   Foot/eye exam completion dates Latest Ref Rng & Units 05/12/2015 10/20/2014  Eye Exam No Retinopathy - No Retinopathy  Foot exam Order - - -  Foot Form Completion - Done -

## 2016-05-29 ENCOUNTER — Other Ambulatory Visit: Payer: Self-pay | Admitting: Family Medicine

## 2016-05-29 ENCOUNTER — Other Ambulatory Visit: Payer: Self-pay | Admitting: Gastroenterology

## 2016-05-29 DIAGNOSIS — R69 Illness, unspecified: Secondary | ICD-10-CM | POA: Diagnosis not present

## 2016-06-07 ENCOUNTER — Other Ambulatory Visit: Payer: Self-pay

## 2016-06-11 ENCOUNTER — Encounter: Payer: Self-pay | Admitting: Internal Medicine

## 2016-06-12 ENCOUNTER — Other Ambulatory Visit: Payer: Self-pay | Admitting: Family Medicine

## 2016-06-13 DIAGNOSIS — N183 Chronic kidney disease, stage 3 (moderate): Secondary | ICD-10-CM | POA: Diagnosis not present

## 2016-06-13 DIAGNOSIS — R809 Proteinuria, unspecified: Secondary | ICD-10-CM | POA: Diagnosis not present

## 2016-06-13 DIAGNOSIS — E1129 Type 2 diabetes mellitus with other diabetic kidney complication: Secondary | ICD-10-CM | POA: Diagnosis not present

## 2016-06-18 ENCOUNTER — Telehealth: Payer: Self-pay | Admitting: *Deleted

## 2016-06-18 NOTE — Telephone Encounter (Signed)
This was done this am

## 2016-06-18 NOTE — Telephone Encounter (Signed)
Patient called left message requesting refills on citalopram and rosuvastatin. Please advise [phone]

## 2016-06-22 ENCOUNTER — Other Ambulatory Visit (HOSPITAL_COMMUNITY): Payer: Self-pay | Admitting: Nephrology

## 2016-06-22 DIAGNOSIS — R609 Edema, unspecified: Secondary | ICD-10-CM

## 2016-06-25 DIAGNOSIS — R69 Illness, unspecified: Secondary | ICD-10-CM | POA: Diagnosis not present

## 2016-06-26 ENCOUNTER — Ambulatory Visit (HOSPITAL_COMMUNITY)
Admission: RE | Admit: 2016-06-26 | Discharge: 2016-06-26 | Disposition: A | Discharge status: Home / Self Care | Payer: Medicare HMO | Source: Ambulatory Visit | Attending: Nephrology | Admitting: Nephrology

## 2016-06-26 DIAGNOSIS — Z79899 Other long term (current) drug therapy: Secondary | ICD-10-CM | POA: Diagnosis not present

## 2016-06-26 DIAGNOSIS — Z1159 Encounter for screening for other viral diseases: Secondary | ICD-10-CM | POA: Diagnosis not present

## 2016-06-26 DIAGNOSIS — D509 Iron deficiency anemia, unspecified: Secondary | ICD-10-CM | POA: Diagnosis not present

## 2016-06-26 DIAGNOSIS — D519 Vitamin B12 deficiency anemia, unspecified: Secondary | ICD-10-CM | POA: Diagnosis not present

## 2016-06-26 DIAGNOSIS — E559 Vitamin D deficiency, unspecified: Secondary | ICD-10-CM | POA: Diagnosis not present

## 2016-06-26 DIAGNOSIS — R809 Proteinuria, unspecified: Secondary | ICD-10-CM | POA: Diagnosis not present

## 2016-06-26 DIAGNOSIS — R609 Edema, unspecified: Secondary | ICD-10-CM

## 2016-06-26 DIAGNOSIS — N183 Chronic kidney disease, stage 3 (moderate): Secondary | ICD-10-CM | POA: Diagnosis not present

## 2016-06-26 DIAGNOSIS — I080 Rheumatic disorders of both mitral and aortic valves: Secondary | ICD-10-CM | POA: Insufficient documentation

## 2016-06-26 DIAGNOSIS — I10 Essential (primary) hypertension: Secondary | ICD-10-CM | POA: Diagnosis not present

## 2016-06-26 LAB — ECHOCARDIOGRAM COMPLETE
AO mean calculated velocity dopler: 159 cm/s
AV Area VTI index: 0.88 cm2/m2
AV Area VTI: 1.52 cm2
AV Area mean vel: 1.74 cm2
AV Mean grad: 13 mmHg
AV Peak grad: 28 mmHg
AV VEL mean LVOT/AV: 0.46
AV area mean vel ind: 0.91 cm2/m2
AV peak Index: 0.8
AV pk vel: 265 cm/s
AV vel: 1.68
Ao pk vel: 0.4 m/s
E decel time: 215 msec
E/e' ratio: 19.76
FS: 46 % — AB (ref 28–44)
IVS/LV PW RATIO, ED: 1.06
LA ID, A-P, ES: 39 mm
LA diam end sys: 39 mm
LA diam index: 2.05 cm/m2
LA vol A4C: 59 ml
LA vol index: 34.5 mL/m2
LA vol: 65.8 mL
LV E/e' medial: 19.76
LV E/e'average: 19.76
LV PW d: 12.6 mm — AB (ref 0.6–1.1)
LV dias vol index: 38 mL/m2
LV dias vol: 73 mL (ref 46–106)
LV e' LATERAL: 4.68 cm/s
LV sys vol index: 12 mL/m2
LV sys vol: 23 mL (ref 14–42)
LVOT SV: 97 mL
LVOT VTI: 25.5 cm
LVOT area: 3.8 cm2
LVOT diameter: 22 mm
LVOT peak VTI: 0.44 cm
LVOT peak grad rest: 4 mmHg
LVOT peak vel: 106 cm/s
MV Dec: 215
MV Peak grad: 3 mmHg
MV pk A vel: 119 m/s
MV pk E vel: 92.5 m/s
P 1/2 time: 1207 ms
RV sys press: 36 mmHg
Reg peak vel: 264 cm/s
Simpson's disk: 69
Stroke v: 50 ml
TAPSE: 23.4 mm
TDI e' lateral: 4.68
TDI e' medial: 6.64
TR max vel: 264 cm/s
VTI: 57.8 cm
Valve area index: 0.88
Valve area: 1.68 cm2

## 2016-06-26 NOTE — Progress Notes (Signed)
*  PRELIMINARY RESULTS* Echocardiogram 2D Echocardiogram has been performed.  Samuel Germany 06/26/2016, 12:30 PM

## 2016-06-28 DIAGNOSIS — E1142 Type 2 diabetes mellitus with diabetic polyneuropathy: Secondary | ICD-10-CM | POA: Diagnosis not present

## 2016-06-28 DIAGNOSIS — L84 Corns and callosities: Secondary | ICD-10-CM | POA: Diagnosis not present

## 2016-06-28 DIAGNOSIS — B351 Tinea unguium: Secondary | ICD-10-CM | POA: Diagnosis not present

## 2016-06-28 DIAGNOSIS — M79671 Pain in right foot: Secondary | ICD-10-CM | POA: Diagnosis not present

## 2016-06-29 DIAGNOSIS — E039 Hypothyroidism, unspecified: Secondary | ICD-10-CM | POA: Diagnosis not present

## 2016-06-29 DIAGNOSIS — K219 Gastro-esophageal reflux disease without esophagitis: Secondary | ICD-10-CM | POA: Diagnosis not present

## 2016-06-29 DIAGNOSIS — G629 Polyneuropathy, unspecified: Secondary | ICD-10-CM | POA: Diagnosis not present

## 2016-06-29 DIAGNOSIS — Z6832 Body mass index (BMI) 32.0-32.9, adult: Secondary | ICD-10-CM | POA: Diagnosis not present

## 2016-06-29 DIAGNOSIS — Z90711 Acquired absence of uterus with remaining cervical stump: Secondary | ICD-10-CM | POA: Diagnosis not present

## 2016-06-29 DIAGNOSIS — Z299 Encounter for prophylactic measures, unspecified: Secondary | ICD-10-CM | POA: Diagnosis not present

## 2016-06-29 DIAGNOSIS — I10 Essential (primary) hypertension: Secondary | ICD-10-CM | POA: Diagnosis not present

## 2016-06-29 DIAGNOSIS — E785 Hyperlipidemia, unspecified: Secondary | ICD-10-CM | POA: Diagnosis not present

## 2016-06-29 DIAGNOSIS — R69 Illness, unspecified: Secondary | ICD-10-CM | POA: Diagnosis not present

## 2016-06-29 DIAGNOSIS — E1165 Type 2 diabetes mellitus with hyperglycemia: Secondary | ICD-10-CM | POA: Diagnosis not present

## 2016-07-03 ENCOUNTER — Other Ambulatory Visit: Payer: Self-pay

## 2016-07-09 ENCOUNTER — Telehealth: Payer: Self-pay | Admitting: "Endocrinology

## 2016-07-09 NOTE — Telephone Encounter (Signed)
[name] [name] is calling stating that she is very confused about any Thyroid Medications that she should be taking if any, please advise?

## 2016-07-10 NOTE — Telephone Encounter (Signed)
Went over pts medications over the phone and she does have Levothyroxine 16mcg.

## 2016-07-18 ENCOUNTER — Other Ambulatory Visit: Payer: Self-pay | Admitting: Family Medicine

## 2016-07-24 ENCOUNTER — Other Ambulatory Visit: Payer: Self-pay | Admitting: Family Medicine

## 2016-07-24 ENCOUNTER — Other Ambulatory Visit: Payer: Self-pay | Admitting: "Endocrinology

## 2016-07-24 LAB — COMPREHENSIVE METABOLIC PANEL
ALT: 15 U/L (ref 6–29)
AST: 23 U/L (ref 10–35)
Albumin: 4.4 g/dL (ref 3.6–5.1)
Alkaline Phosphatase: 38 U/L (ref 33–130)
BUN: 46 mg/dL — ABNORMAL HIGH (ref 7–25)
CALCIUM: 9.6 mg/dL (ref 8.6–10.4)
CHLORIDE: 102 mmol/L (ref 98–110)
CO2: 25 mmol/L (ref 20–31)
Creat: 1.49 mg/dL — ABNORMAL HIGH (ref 0.60–0.88)
Glucose, Bld: 122 mg/dL — ABNORMAL HIGH (ref 65–99)
Potassium: 4.7 mmol/L (ref 3.5–5.3)
Sodium: 140 mmol/L (ref 135–146)
Total Bilirubin: 0.4 mg/dL (ref 0.2–1.2)
Total Protein: 7.2 g/dL (ref 6.1–8.1)

## 2016-07-24 LAB — T4, FREE: FREE T4: 1.2 ng/dL (ref 0.8–1.8)

## 2016-07-24 LAB — TSH: TSH: 1.54 mIU/L

## 2016-07-25 LAB — HEMOGLOBIN A1C
HEMOGLOBIN A1C: 7.1 % — AB (ref <5.7)
MEAN PLASMA GLUCOSE: 157 mg/dL

## 2016-07-26 ENCOUNTER — Ambulatory Visit: Payer: Medicare HMO | Admitting: Family Medicine

## 2016-07-30 ENCOUNTER — Encounter: Payer: Self-pay | Admitting: "Endocrinology

## 2016-07-30 ENCOUNTER — Other Ambulatory Visit: Payer: Self-pay | Admitting: "Endocrinology

## 2016-07-30 ENCOUNTER — Ambulatory Visit (INDEPENDENT_AMBULATORY_CARE_PROVIDER_SITE_OTHER): Payer: Medicare HMO | Admitting: "Endocrinology

## 2016-07-30 VITALS — BP 92/60 | HR 79 | Ht 62.0 in | Wt 182.2 lb

## 2016-07-30 DIAGNOSIS — I10 Essential (primary) hypertension: Secondary | ICD-10-CM

## 2016-07-30 DIAGNOSIS — E782 Mixed hyperlipidemia: Secondary | ICD-10-CM

## 2016-07-30 DIAGNOSIS — E039 Hypothyroidism, unspecified: Secondary | ICD-10-CM | POA: Diagnosis not present

## 2016-07-30 DIAGNOSIS — E1121 Type 2 diabetes mellitus with diabetic nephropathy: Secondary | ICD-10-CM | POA: Diagnosis not present

## 2016-07-30 NOTE — Progress Notes (Signed)
Subjective:    Patient ID: [name] [name], female    DOB: 09/14/1929, PCP Fayrene Helper, MD   Past Medical History:  Diagnosis Date  . Abdominal bloating   . Acute cystitis   . Anticoagulant long-term use    Coumadin  stopped...severe hematoma  from fall  . Aortic valve sclerosis    Ef 60 %...echo...july 2010   . Arthritis   . Atrial flutter ([place]) febuary 2008      atrial flutter... DC Cardioversion...successful... holding sinus as of january 2011 coumadin therapy...discontinued...severe hematoma secondary to fall  . Back pain    with radiculopathy  . Basal cell carcinoma 01/2015   nose  . Carotid artery disease ([place])    Doppler, [facility], February 21, 2012, less than 50% bilateral carotid stenoses  . Chronic granulomatous disease ([place])    right chest ... ct chest... novmber 2009  . Constipation   . Depression with anxiety   . Diabetes mellitus    type 2  . Diverticulosis of colon   . Edema    Painful, April, 2012  . Ejection fraction    EF 60-65%, echo, April 13, 2010, normal RV function, mild mitral regurgitation  . Erosive esophagitis   . Fatigue   . GERD (gastroesophageal reflux disease)   . Hematochezia   . Hematoma    ...buttocks secondary to fall november 2009 with coumadin  coumadin stopped  . Hypercholesterolemia   . Hyperlipidemia   . Hypertension   . IBS (irritable bowel syndrome)   . Mitral regurgitation 2010   mild ...echo...2010  . Nausea   . Normal nuclear stress test    september ,2007...no ischemia  . Obesity   . Osteoarthritis   . Sleep apnea   . Visual loss    unspecified   Past Surgical History:  Procedure Laterality Date  . ABDOMINAL HYSTERECTOMY  2001  . APPENDECTOMY  1948  . BASAL CELL CARCINOMA EXCISION  09/2009   right forarm and back of neck and left side   . bilateral bunion removel  [id number]  . CATARACT EXTRACTION  2001   right   . CATARACT EXTRACTION     left 2005  . COLONOSCOPY   07/17/2006   Dr.  Rourk:anal canal hemorrhoids, diminutive rectal polyp status post cold biopsy.  Otherwise, normal rectum. Left sided and  transverse diverticula.  Diminutive polyp in the ascending colon, cold bx. hyperplastic polyps.   Marland Kitchen [place] and 2001  . ESOPHAGOGASTRODUODENOSCOPY   07/17/2006   Dr. Rourk:single 2 cm distal esophageal erosion consistent with erosive reflux esophagitis, otherwise normal  . ESOPHAGOGASTRODUODENOSCOPY N/A 09/10/2013   Dr. Rourk:Schatzki's ring - status post Venia Minks dilation. Mild erosive reflux esophagitis. Hiatal hernia with fosamax capsule lodged as above  . MALONEY DILATION N/A 09/10/2013   Procedure: Venia Minks DILATION;  Surgeon: Daneil Dolin, MD;  Location: [facility];  Service: Endoscopy;  Laterality: N/A;  . OTHER SURGICAL HISTORY    . REPLACEMENT TOTAL KNEE BILATERAL     2000,2001  . SAVORY DILATION N/A 09/10/2013   Procedure: SAVORY DILATION;  Surgeon: Daneil Dolin, MD;  Location: [facility] SUITE;  Service: Endoscopy;  Laterality: N/A;  . TONSILLECTOMY  1957   Social History   Social History  . Marital status: Married    Spouse name: N/A  . Number of children: N/A  . Years of education: 39   Occupational History  . retired Retired   Science writer  History Main Topics  . Smoking status: Never Smoker  . Smokeless tobacco: Never Used  . Alcohol use No  . Drug use: No  . Sexual activity: Not Currently   Other Topics Concern  . None   Social History Narrative  . None   Outpatient Encounter Prescriptions as of 07/30/2016  Medication Sig  . aspirin 81 MG tablet Take 81 mg by mouth daily.    . benazepril-hydrochlorthiazide (LOTENSIN HCT) 20-12.5 MG tablet TAKE 2 TABLETS BY MOUTH DAILY  . calcium carbonate (OSCAL) 1500 (600 CA) MG TABS tablet Take 2 tablets by mouth 1 day or 1 dose.  . citalopram (CELEXA) 40 MG tablet TAKE ONE TABLET BY MOUTH ONCE DAILY  . DILT-XR 240 MG 24 hr capsule TAKE 1 CAPSULE BY MOUTH DAILY  . docusate  sodium (COLACE) 100 MG capsule Take 100 mg by mouth daily.  . fenofibrate (TRICOR) 145 MG tablet TAKE 1 TABLET BY MOUTH DAILY AT BEDTIME  . fish oil-omega-3 fatty acids 1000 MG capsule Take 1 g by mouth daily. 2 caps bid   . fluticasone (FLONASE) 50 MCG/ACT nasal spray USE TWO SPRAY(S) IN EACH NOSTRIL ONCE DAILY  . furosemide (LASIX) 40 MG tablet TAKE ONE TABLET BY MOUTH ONCE DAILY  . gabapentin (NEURONTIN) 100 MG capsule Three capsules twice daily  . glucose blood (ONETOUCH VERIO) test strip Use as instructed tid. E11.65  . insulin NPH-regular Human (NOVOLIN 70/30 RELION) (70-30) 100 UNIT/ML injection INJECT 15 UNITS SUBCUTANEOUSLY TWICE DAILY WITH  A  MEAL  . L-Methylfolate-Algae-B12-B6 (METANX) 3-90.314-2-35 MG CAPS Take 1 capsule by mouth daily.  Marland Kitchen levothyroxine (SYNTHROID, LEVOTHROID) 75 MCG tablet Take 1 tablet (75 mcg total) by mouth daily before breakfast.  . Linaclotide (LINZESS) 145 MCG CAPS capsule Take 145 mcg by mouth daily as needed.  . meclizine (ANTIVERT) 12.5 MG tablet Take 1 tablet (12.5 mg total) by mouth 3 (three) times daily as needed for dizziness.  . Multiple Vitamin (MULTIVITAMIN) tablet Take 1 tablet by mouth daily.  Marland Kitchen oxyCODONE-acetaminophen (PERCOCET) 10-325 MG tablet One tablet twice daily  . pantoprazole (PROTONIX) 40 MG tablet TAKE ONE TABLET BY MOUTH ONCE DAILY  . ranitidine (ZANTAC) 150 MG tablet TAKE ONE TABLET BY MOUTH AT BEDTIME  . rosuvastatin (CRESTOR) 40 MG tablet TAKE ONE TABLET BY MOUTH ONCE DAILY  . [DISCONTINUED] cephALEXin (KEFLEX) 250 MG capsule Take 1 capsule (250 mg total) by mouth 3 (three) times daily.   No facility-administered encounter medications on file as of 07/30/2016.    ALLERGIES: Allergies  Allergen Reactions  . Lisinopril Nausea And Vomiting  . Reclast [Zoledronic Acid] Other (See Comments)    Jaw pain, and possible deterioration in kidney function  . Alprazolam Other (See Comments)    hyper  . Losartan Other (See Comments)     Raw throat  . Sertraline Hcl Diarrhea  . Sulfonamide Derivatives Rash   VACCINATION STATUS: Immunization History  Administered Date(s) Administered  . H1N1 11/04/2007  . Influenza Split 10/12/2010, 09/28/2011  . Influenza Whole 10/25/2005, 11/01/2008, 10/04/2009  . Influenza,inj,Quad PF,36+ Mos 11/10/2012, 10/29/2013, 09/28/2014, 10/31/2015  . Pneumococcal Conjugate-13 05/04/2014  . Pneumococcal Polysaccharide-23 05/27/2003  . Td 10/04/2009  . Zoster 03/25/2006    Diabetes  She presents for her follow-up diabetic visit. She has type 2 diabetes mellitus. Onset time: She was diagnosed at approximate age of 23 years. Her disease course has been stable. There are no hypoglycemic associated symptoms. Pertinent negatives for hypoglycemia include no confusion, headaches, pallor or seizures. Pertinent  negatives for diabetes include no chest pain, no polydipsia, no polyphagia and no polyuria. There are no hypoglycemic complications. Symptoms are stable. Diabetic complications include nephropathy. Risk factors for coronary artery disease include diabetes mellitus, dyslipidemia, hypertension and sedentary lifestyle. When asked about current treatments, none were reported. She is compliant with treatment some of the time. Her weight is stable. She is following a generally unhealthy diet. She has not had a previous visit with a dietitian. She never participates in exercise. (She did not bring her logs or nor meter to review today. Her A1c is stable at 7.1%.) An ACE inhibitor/angiotensin II receptor blocker is being taken. Eye exam is current.  Thyroid Problem  Presents for initial visit. Onset time: she is being diagnosed with primary hypothyroidism today. Patient reports no cold intolerance, diarrhea, heat intolerance or palpitations. Past treatments include nothing. The following procedures have not been performed: radioiodine uptake scan, thyroid FNA, thyroid ultrasound and thyroidectomy. Her past medical  history is significant for hyperlipidemia.  Hyperlipidemia  This is a chronic problem. The current episode started more than 1 year ago. Pertinent negatives include no chest pain, myalgias or shortness of breath. Current antihyperlipidemic treatment includes statins.  Hypertension  This is a chronic problem. The current episode started more than 1 year ago. The problem is controlled. Pertinent negatives include no chest pain, headaches, palpitations or shortness of breath. Past treatments include ACE inhibitors. Identifiable causes of hypertension include a thyroid problem.     Review of Systems  Constitutional: Negative for unexpected weight change.  HENT: Negative for trouble swallowing and voice change.   Eyes: Negative for visual disturbance.  Respiratory: Negative for cough, shortness of breath and wheezing.   Cardiovascular: Negative for chest pain, palpitations and leg swelling.  Gastrointestinal: Negative for diarrhea, nausea and vomiting.  Endocrine: Negative for cold intolerance, heat intolerance, polydipsia, polyphagia and polyuria.  Musculoskeletal: Positive for arthralgias and back pain. Negative for myalgias.  Skin: Negative for color change, pallor, rash and wound.  Neurological: Negative for seizures and headaches.  Psychiatric/Behavioral: Negative for confusion and suicidal ideas.    Objective:    BP 92/60 (BP Location: Left Arm, Patient Position: Sitting, Cuff Size: Large)   Pulse 79   Ht 5\' 2"  (1.575 m)   Wt 182 lb 3.2 oz (82.6 kg)   SpO2 96%   BMI 33.32 kg/m   Wt Readings from Last 3 Encounters:  07/30/16 182 lb 3.2 oz (82.6 kg)  05/21/16 177 lb (80.3 kg)  04/26/16 184 lb (83.5 kg)    Physical Exam  Constitutional: She is oriented to person, place, and time. She appears well-developed.  Walks with her walker.  HENT:  Head: Normocephalic and atraumatic.  Eyes: EOM are normal.  Neck: Normal range of motion. Neck supple. No tracheal deviation present. No  thyromegaly present.  Cardiovascular: Normal rate and regular rhythm.   Pulmonary/Chest: Effort normal and breath sounds normal.  Abdominal: Soft. Bowel sounds are normal. There is no tenderness. There is no guarding.  Musculoskeletal: She exhibits no edema.  Walks with her walker.  Neurological: She is alert and oriented to person, place, and time. She has normal reflexes. No cranial nerve deficit. Coordination normal.  Skin: Skin is warm and dry. No rash noted. No erythema. No pallor.  Psychiatric: She has a normal mood and affect. Judgment normal.    Results for orders placed or performed in visit on 07/24/16  Comprehensive metabolic panel  Result Value Ref Range   Sodium 140 135 -  146 mmol/L   Potassium 4.7 3.5 - 5.3 mmol/L   Chloride 102 98 - 110 mmol/L   CO2 25 20 - 31 mmol/L   Glucose, Bld 122 (H) 65 - 99 mg/dL   BUN 46 (H) 7 - 25 mg/dL   Creat 1.49 (H) 0.60 - 0.88 mg/dL   Total Bilirubin 0.4 0.2 - 1.2 mg/dL   Alkaline Phosphatase 38 33 - 130 U/L   AST 23 10 - 35 U/L   ALT 15 6 - 29 U/L   Total Protein 7.2 6.1 - 8.1 g/dL   Albumin 4.4 3.6 - 5.1 g/dL   Calcium 9.6 8.6 - 10.4 mg/dL  TSH  Result Value Ref Range   TSH 1.54 mIU/L  T4, free  Result Value Ref Range   Free T4 1.2 0.8 - 1.8 ng/dL  Hemoglobin A1c  Result Value Ref Range   Hgb A1c MFr Bld 7.1 (H) <5.7 %   Mean Plasma Glucose 157 mg/dL   Complete Blood Count (Most recent): Lab Results  Component Value Date   WBC 8.3 10/29/2015   HGB 11.7 10/29/2015   HCT 36.2 10/29/2015   MCV 94.8 10/29/2015   PLT 186 10/29/2015   Chemistry (most recent):  Diabetic Labs (most recent): Lab Results  Component Value Date   HGBA1C 7.1 (H) 07/24/2016   HGBA1C 7.0 (H) 04/21/2016   HGBA1C 6.9 (H) 12/31/2015   Lipid Panel     Component Value Date/Time   CHOL 151 04/21/2016 0813   TRIG 100 04/21/2016 0813   HDL 73 04/21/2016 0813   CHOLHDL 2.1 04/21/2016 0813   VLDL 20 04/21/2016 0813   LDLCALC 58 04/21/2016 0813      Assessment & Plan:   1. Diabetes mellitus with nephropathy ([place])  - Her diabetes is complicated by CKD, and she  remains at a high risk for more acute and chronic complications of diabetes which include CAD, CVA, CKD, retinopathy, and neuropathy. These are all discussed in detail with the patient.  Patient came with controlled glucose profile, and  recent A1c is  Stable at 7.1%, generally improving from  9.5 %. .  -  Recent labs reviewed.   - I have re-counseled the patient on diet management   by adopting a carbohydrate restricted / protein rich  Diet.  - Suggestion is made for patient to avoid simple carbohydrates   from her diet including Cakes , Desserts, Ice Cream,  Soda (  diet and regular) , Sweet Tea , Candies,  Chips, Cookies, Artificial Sweeteners,   and "Sugar-free" Products .  This will help patient to have stable blood glucose profile and potentially avoid unintended  Weight gain.  - Patient is advised to stick to a routine mealtimes to eat 3 meals  a day and avoid unnecessary snacks ( to snack only to correct hypoglycemia).  - I have approached patient with the following individualized plan to manage diabetes and patient agrees.  - Given the long duration of her diabetes, she will continue to need insulin therapy, she did reasonably well on premixed Novolin 70/30. - I  advised her to continue   Novolin 70/30   15 units before breakfast and 15 units with supper, for pre-meal blood glucose above 90 mg/dL.  - She will continue to monitor blood glucose strictly before breakfast and supper and at any other time as needed.  - Patient is warned not to take insulin without proper monitoring per orders. -Adjustment parameters are given for hypo and hyperglycemia  in writing. -Patient is encouraged to call clinic for blood glucose levels less than 70 or above 300 mg /dl.  -Patient is not a candidate for MTF, SGLT2 i  due to CKD.  - Patient specific target  for A1c; LDL, HDL,  Triglycerides, and  Waist Circumference were discussed in detail.  2) BP/HTN: controlled. Continue current medications including ACEI/ARB. 3) Lipids/HPL:  continue statins. 4)  Weight/Diet: CDE consult in progress, exercise, and carbohydrates information provided.  5) hypothyroidism:  - Her thyroid function tests are consistent with appropriate replacement. I advised her to continue levothyroxine  75 g by mouth every morning.  - We discussed about correct intake of levothyroxine, at fasting, with water, separated by at least 30 minutes from breakfast, and separated by more than 4 hours from calcium, iron, multivitamins, acid reflux medications (PPIs). -Patient is made aware of the fact that thyroid hormone replacement is needed for life, dose to be adjusted by periodic monitoring of thyroid function tests.  6) Chronic Care/Health Maintenance:  -Patient is on ACEI/ARB and Statin medications and encouraged to continue to follow up with Ophthalmology, Podiatrist at least yearly or according to recommendations, and advised to  stay away from smoking. I have recommended yearly flu vaccine and pneumonia vaccination at least every 5 years; moderate intensity exercise for up to 150 minutes weekly; and  sleep for at least 7 hours a day.  - 20 minutes of time was spent on the care of this patient , 50% of which was applied for counseling on diabetes complications and their preventions.  - I advised patient to maintain close follow up with Fayrene Helper, MD for primary care needs.  Patient is asked to bring meter and  blood glucose logs during her next visit.   Follow up plan: -Return in about 4 months (around 11/30/2016) for meter, and logs.  Glade Lloyd, MD Phone: [phone]  Fax: [phone]   07/30/2016, 1:27 PM

## 2016-08-02 ENCOUNTER — Encounter: Payer: Self-pay | Admitting: Gastroenterology

## 2016-08-31 ENCOUNTER — Ambulatory Visit: Payer: Medicare HMO | Admitting: Gastroenterology

## 2016-09-04 ENCOUNTER — Ambulatory Visit (INDEPENDENT_AMBULATORY_CARE_PROVIDER_SITE_OTHER): Payer: Medicare HMO | Admitting: Urology

## 2016-09-04 ENCOUNTER — Encounter: Payer: Self-pay | Admitting: Emergency Medicine

## 2016-09-04 DIAGNOSIS — R3915 Urgency of urination: Secondary | ICD-10-CM | POA: Diagnosis not present

## 2016-09-04 DIAGNOSIS — R351 Nocturia: Secondary | ICD-10-CM | POA: Diagnosis not present

## 2016-09-04 DIAGNOSIS — N3941 Urge incontinence: Secondary | ICD-10-CM

## 2016-09-04 DIAGNOSIS — N3281 Overactive bladder: Secondary | ICD-10-CM | POA: Diagnosis not present

## 2016-09-21 ENCOUNTER — Ambulatory Visit: Payer: Medicare HMO | Admitting: Gastroenterology

## 2016-10-03 ENCOUNTER — Other Ambulatory Visit: Payer: Self-pay | Admitting: Family Medicine

## 2016-10-04 NOTE — Telephone Encounter (Signed)
Seen 5 21 18

## 2016-10-06 ENCOUNTER — Other Ambulatory Visit: Payer: Self-pay | Admitting: Family Medicine

## 2016-10-08 NOTE — Telephone Encounter (Signed)
Seen 5 21 18

## 2016-10-12 ENCOUNTER — Ambulatory Visit: Payer: Medicare HMO | Admitting: Nurse Practitioner

## 2016-10-16 ENCOUNTER — Ambulatory Visit (INDEPENDENT_AMBULATORY_CARE_PROVIDER_SITE_OTHER): Payer: Medicare HMO | Admitting: Urology

## 2016-10-16 DIAGNOSIS — R351 Nocturia: Secondary | ICD-10-CM | POA: Diagnosis not present

## 2016-10-16 DIAGNOSIS — R3915 Urgency of urination: Secondary | ICD-10-CM

## 2016-10-16 DIAGNOSIS — R32 Unspecified urinary incontinence: Secondary | ICD-10-CM | POA: Diagnosis not present

## 2016-11-15 ENCOUNTER — Encounter: Payer: Self-pay | Admitting: *Deleted

## 2016-11-15 ENCOUNTER — Encounter: Payer: Self-pay | Admitting: Nurse Practitioner

## 2016-11-15 ENCOUNTER — Other Ambulatory Visit: Payer: Self-pay | Admitting: *Deleted

## 2016-11-15 ENCOUNTER — Ambulatory Visit: Payer: Medicare HMO | Admitting: Nurse Practitioner

## 2016-11-15 VITALS — BP 161/69 | HR 56 | Temp 97.3°F | Ht 62.0 in | Wt 171.0 lb

## 2016-11-15 DIAGNOSIS — R1310 Dysphagia, unspecified: Secondary | ICD-10-CM | POA: Diagnosis not present

## 2016-11-15 DIAGNOSIS — R1319 Other dysphagia: Secondary | ICD-10-CM

## 2016-11-15 DIAGNOSIS — K5900 Constipation, unspecified: Secondary | ICD-10-CM

## 2016-11-15 MED ORDER — LINACLOTIDE 72 MCG PO CAPS: 72.0000 ug | ORAL_CAPSULE | Freq: Every day | ORAL | 5 refills | Status: AC

## 2016-11-15 NOTE — Patient Instructions (Addendum)
1. I have sent in a refill of Linzess to your pharmacy. 2. We will schedule your upper endoscopy for you. 3. Return for follow-up in 3 months. 4. Call us if you have any questions or concerns.   Happy Thanksgiving!!!

## 2016-11-15 NOTE — Progress Notes (Signed)
Referring Provider: Fayrene Helper, MD Primary Care Physician:  Glenda Chroman, MD Primary GI:  Dr. Gala Romney  Chief Complaint  Patient presents with  . Dysphagia    food  . Constipation    HPI:   [name] [name] is a 83 y.o. female who presents for evaluation of constipation and dysphasia.  The patient was last seen in our office 08/01/2015 for constipation.  That time it was noted the patient has a chronic history of dysphagia and early satiety.  EGD completed 2015 with Schatzki's ring status post dilation, chronic constipation with Linzess 145 2 strong.  She has chronic back pain.  Takes Linzess 145 as needed and even then it is "too strong."  Occasional spells when she swallows wrong but "not with enough notice".  No other GI symptoms.  Feels she is doing well from a GI perspective.  She was given samples of newly released Linzess 72 mcg, follow-up in 1 year.  Today she states she's doing well. She states Linzess 72 mcg works really well, needs a refill today. She has some worsening dysphagia with more frequency. Last night she had a lot of regurgitation. Has solid food dysphagia about every few days. Occasional regurgitation. Denies abdominal pain, N/V, hematochezia, melena, fever, chills, unintentional weight loss. Denies chest pain, dyspnea, dizziness, lightheadedness, syncope, near syncope. Denies any other upper or lower GI symptoms.  Past Medical History:  Diagnosis Date  . Abdominal bloating   . Acute cystitis   . Anticoagulant long-term use    Coumadin  stopped...severe hematoma  from fall  . Aortic valve sclerosis    Ef 60 %...echo...july 2010   . Arthritis   . Atrial flutter ([place]) febuary 2008      atrial flutter... DC Cardioversion...successful... holding sinus as of january 2011 coumadin therapy...discontinued...severe hematoma secondary to fall  . Back pain    with radiculopathy  . Basal cell carcinoma 01/2015   nose  . Carotid artery disease ([place])    Doppler,  [facility], February 21, 2012, less than 50% bilateral carotid stenoses  . Chronic granulomatous disease ([place])    right chest ... ct chest... novmber 2009  . Constipation   . Depression with anxiety   . Diabetes mellitus    type 2  . Diverticulosis of colon   . Edema    Painful, April, 2012  . Ejection fraction    EF 60-65%, echo, April 13, 2010, normal RV function, mild mitral regurgitation  . Erosive esophagitis   . Fatigue   . GERD (gastroesophageal reflux disease)   . Hematochezia   . Hematoma    ...buttocks secondary to fall november 2009 with coumadin  coumadin stopped  . Hypercholesterolemia   . Hyperlipidemia   . Hypertension   . IBS (irritable bowel syndrome)   . Mitral regurgitation 2010   mild ...echo...2010  . Nausea   . Normal nuclear stress test    september ,2007...no ischemia  . Obesity   . Osteoarthritis   . Sleep apnea   . Visual loss    unspecified    Past Surgical History:  Procedure Laterality Date  . ABDOMINAL HYSTERECTOMY  2001  . APPENDECTOMY  1948  . BASAL CELL CARCINOMA EXCISION  09/2009   right forarm and back of neck and left side   . bilateral bunion removel  [id number]  . CATARACT EXTRACTION  2001   right   . CATARACT EXTRACTION     left 2005  . COLONOSCOPY  07/17/2006   Dr. Rourk:anal canal hemorrhoids, diminutive rectal polyp status post cold biopsy.  Otherwise, normal rectum. Left sided and  transverse diverticula.  Diminutive polyp in the ascending colon, cold bx. hyperplastic polyps.   Marland Kitchen [place] and 2001  . ESOPHAGOGASTRODUODENOSCOPY   07/17/2006   Dr. Rourk:single 2 cm distal esophageal erosion consistent with erosive reflux esophagitis, otherwise normal  . ESOPHAGOGASTRODUODENOSCOPY N/A 09/10/2013   Dr. Rourk:Schatzki's ring - status post Venia Minks dilation. Mild erosive reflux esophagitis. Hiatal hernia with fosamax capsule lodged as above  . MALONEY DILATION N/A 09/10/2013   Procedure: Venia Minks  DILATION;  Surgeon: Daneil Dolin, MD;  [facility];  Service: Endoscopy;  Laterality: N/A;  . OTHER SURGICAL HISTORY    . REPLACEMENT TOTAL KNEE BILATERAL     2000,2001  . SAVORY DILATION N/A 09/10/2013   Procedure: SAVORY DILATION;  Surgeon: Daneil Dolin, MD;  [facility]: [facility];  Service: Endoscopy;  Laterality: N/A;  . TONSILLECTOMY  1957    Current Outpatient Medications  Medication Sig Dispense Refill  . aspirin 81 MG tablet Take 81 mg by mouth daily.      . benazepril-hydrochlorthiazide (LOTENSIN HCT) 20-12.5 MG tablet TAKE 2 TABLETS BY MOUTH DAILY 180 tablet 1  . calcium carbonate (OSCAL) 1500 (600 CA) MG TABS tablet Take 2 tablets by mouth 1 day or 1 dose.    . citalopram (CELEXA) 40 MG tablet TAKE ONE TABLET BY MOUTH ONCE DAILY (Patient taking differently: TAKE ONE TABLET BY MOUTH ONCE DAILY. Takes 10mg  daily) 90 tablet 1  . DILT-XR 240 MG 24 hr capsule TAKE 1 CAPSULE BY MOUTH DAILY 90 capsule 1  . docusate sodium (COLACE) 100 MG capsule Take 100 mg by mouth daily.    . fenofibrate (TRICOR) 145 MG tablet TAKE 1 TABLET BY MOUTH DAILY AT BEDTIME 90 tablet 1  . fish oil-omega-3 fatty acids 1000 MG capsule Take 1 g by mouth daily. 2 caps bid     . fluticasone (FLONASE) 50 MCG/ACT nasal spray USE 2 SPRAY(S) IN EACH NOSTRIL ONCE DAILY 48 g 1  . furosemide (LASIX) 40 MG tablet TAKE ONE TABLET BY MOUTH ONCE DAILY 30 tablet 4  . gabapentin (NEURONTIN) 100 MG capsule Three capsules twice daily 180 capsule 3  . glucose blood (ONETOUCH VERIO) test strip Use as instructed tid. E11.65 100 each 5  . insulin NPH-regular Human (NOVOLIN 70/30 RELION) (70-30) 100 UNIT/ML injection INJECT 15 UNITS SUBCUTANEOUSLY TWICE DAILY WITH  A  MEAL 20 mL 2  . L-Methylfolate-Algae-B12-B6 (METANX) 3-90.314-2-35 MG CAPS Take 1 capsule by mouth daily. 90 capsule 1  . levothyroxine (SYNTHROID, LEVOTHROID) 75 MCG tablet Take 1 tablet (75 mcg total) by mouth daily before breakfast. 30 tablet 3  .  linaclotide (LINZESS) 72 MCG capsule Take 72 mcg daily before breakfast by mouth.    . Multiple Vitamin (MULTIVITAMIN) tablet Take 1 tablet by mouth daily.    Marland Kitchen oxyCODONE-acetaminophen (PERCOCET) 10-325 MG tablet One tablet twice daily 60 tablet 0  . pantoprazole (PROTONIX) 40 MG tablet TAKE ONE TABLET BY MOUTH ONCE DAILY 30 tablet 5  . ranitidine (ZANTAC) 150 MG tablet TAKE ONE TABLET BY MOUTH AT BEDTIME 90 tablet 1  . rosuvastatin (CRESTOR) 40 MG tablet TAKE ONE TABLET BY MOUTH ONCE DAILY 90 tablet 1   No current facility-administered medications for this visit.     Allergies as of 11/15/2016 - Review Complete 11/15/2016  Allergen Reaction Noted  . Lisinopril  Nausea And Vomiting   . Reclast [zoledronic acid] Other (See Comments) 02/23/2014  . Alprazolam Other (See Comments)   . Losartan Other (See Comments) 04/08/2013  . Sertraline hcl Diarrhea 05/24/2008  . Sulfonamide derivatives Rash     Family History  Problem Relation Age of Onset  . Dementia Mother   . Heart disease Father   . Diabetes Unknown   . Arthritis Unknown   . Colon cancer Neg Hx     Social History   Socioeconomic History  . Marital status: Married    Spouse name: None  . Number of children: None  . Years of education: 76  . Highest education level: None  Social Needs  . Financial resource strain: None  . Food insecurity - worry: None  . Food insecurity - inability: None  . Transportation needs - medical: None  . Transportation needs - non-medical: None  Occupational History  . Occupation: retired    Fish farm manager: RETIRED  Tobacco Use  . Smoking status: Never Smoker  . Smokeless tobacco: Never Used  Substance and Sexual Activity  . Alcohol use: No    Alcohol/week: 0.0 oz  . Drug use: No  . Sexual activity: Not Currently  Other Topics Concern  . None  Social History Narrative  . None    Review of Systems: General: Negative for anorexia, weight loss, fever, chills, fatigue, weakness. Eyes:  Negative for vision changes.  ENT: Negative for hoarseness. Admits solid food dysphagia. CV: Negative for chest pain, angina, palpitations, peripheral edema.  Respiratory: Negative for dyspnea at rest, cough, sputum, wheezing.  GI: See history of present illness. MS: Admits chronic hip pain related to bursitis.  Derm: Negative for rash or itching.  Endo: Negative for unusual weight change.  Heme: Negative for bruising or bleeding. Allergy: Negative for rash or hives.   Physical Exam: BP (!) 161/69   Pulse (!) 56   Temp (!) 97.3 F (36.3 C) (Oral)   Ht 5\' 2"  (1.575 m)   Wt 171 lb (77.6 kg)   BMI 31.28 kg/m  General:   Alert and oriented. Pleasant and cooperative. Well-nourished and well-developed. Ambulates with a walker. Eyes:  Without icterus, sclera clear and conjunctiva pink.  Ears:  Normal auditory acuity. Cardiovascular:  S1, S2 present without murmurs appreciated. Extremities without clubbing or edema. Respiratory:  Clear to auscultation bilaterally. No wheezes, rales, or rhonchi. No distress.  Gastrointestinal:  +BS, soft, non-tender and non-distended. No HSM noted. No guarding or rebound. No masses appreciated.  Rectal:  Deferred  Musculoskalatal:  Symmetrical without gross deformities. Normal posture. Neurologic:  Alert and oriented x4;  grossly normal neurologically. Psych:  Alert and cooperative. Normal mood and affect. Heme/Lymph/Immune: No excessive bruising noted.    11/15/2016 9:16 AM   Disclaimer: This note was dictated with voice recognition software. Similar sounding words can inadvertently be transcribed and may not be corrected upon review.

## 2016-11-15 NOTE — Assessment & Plan Note (Signed)
The patient is having worsening and more frequent dysphagia with solid food.  Last night she had significant regurgitation when eating rice.  She had an EGD in 2015 which Schatzki's ring status post dilation.  No red flag or warning signs or symptoms.  Given her recurrent dysphagia we will proceed with upper endoscopy at this time.  Proceed with EGD +/- dilation with Dr. Gala Romney in near future: the risks, benefits, and alternatives have been discussed with the patient in detail. The patient states understanding and desires to proceed.  The patient is currently on Celexa, Neurontin, oxycodone.  No other anticoagulants, anxiolytics, chronic pain medications, or antidepressants.  We will plan for the procedure with 12.5 mg preprocedure Phenergan to promote adequate sedation.

## 2016-11-15 NOTE — Assessment & Plan Note (Signed)
Constipation is doing well with Linzess 72 mcg.  She has run out and was requesting a refill.  I will refill her Linzess and recommend she continue to take this.  Return for follow-up in 3 months.

## 2016-11-15 NOTE — Progress Notes (Signed)
cc'ed to pcp °

## 2016-11-15 NOTE — H&P (View-Only) (Signed)
Referring Provider: Fayrene Helper, MD Primary Care Physician:  Glenda Chroman, MD Primary GI:  Dr. Gala Romney  Chief Complaint  Patient presents with  . Dysphagia    food  . Constipation    HPI:   [name] [name] is a 83 y.o. female who presents for evaluation of constipation and dysphasia.  The patient was last seen in our office 08/01/2015 for constipation.  That time it was noted the patient has a chronic history of dysphagia and early satiety.  EGD completed 2015 with Schatzki's ring status post dilation, chronic constipation with Linzess 145 2 strong.  She has chronic back pain.  Takes Linzess 145 as needed and even then it is "too strong."  Occasional spells when she swallows wrong but "not with enough notice".  No other GI symptoms.  Feels she is doing well from a GI perspective.  She was given samples of newly released Linzess 72 mcg, follow-up in 1 year.  Today she states she's doing well. She states Linzess 72 mcg works really well, needs a refill today. She has some worsening dysphagia with more frequency. Last night she had a lot of regurgitation. Has solid food dysphagia about every few days. Occasional regurgitation. Denies abdominal pain, N/V, hematochezia, melena, fever, chills, unintentional weight loss. Denies chest pain, dyspnea, dizziness, lightheadedness, syncope, near syncope. Denies any other upper or lower GI symptoms.  Past Medical History:  Diagnosis Date  . Abdominal bloating   . Acute cystitis   . Anticoagulant long-term use    Coumadin  stopped...severe hematoma  from fall  . Aortic valve sclerosis    Ef 60 %...echo...july 2010   . Arthritis   . Atrial flutter ([place]) febuary 2008      atrial flutter... DC Cardioversion...successful... holding sinus as of january 2011 coumadin therapy...discontinued...severe hematoma secondary to fall  . Back pain    with radiculopathy  . Basal cell carcinoma 01/2015   nose  . Carotid artery disease ([place])    Doppler,  [facility], February 21, 2012, less than 50% bilateral carotid stenoses  . Chronic granulomatous disease ([place])    right chest ... ct chest... novmber 2009  . Constipation   . Depression with anxiety   . Diabetes mellitus    type 2  . Diverticulosis of colon   . Edema    Painful, April, 2012  . Ejection fraction    EF 60-65%, echo, April 13, 2010, normal RV function, mild mitral regurgitation  . Erosive esophagitis   . Fatigue   . GERD (gastroesophageal reflux disease)   . Hematochezia   . Hematoma    ...buttocks secondary to fall november 2009 with coumadin  coumadin stopped  . Hypercholesterolemia   . Hyperlipidemia   . Hypertension   . IBS (irritable bowel syndrome)   . Mitral regurgitation 2010   mild ...echo...2010  . Nausea   . Normal nuclear stress test    september ,2007...no ischemia  . Obesity   . Osteoarthritis   . Sleep apnea   . Visual loss    unspecified    Past Surgical History:  Procedure Laterality Date  . ABDOMINAL HYSTERECTOMY  2001  . APPENDECTOMY  1948  . BASAL CELL CARCINOMA EXCISION  09/2009   right forarm and back of neck and left side   . bilateral bunion removel  [id number]  . CATARACT EXTRACTION  2001   right   . CATARACT EXTRACTION     left 2005  . COLONOSCOPY  07/17/2006   Dr. Rourk:anal canal hemorrhoids, diminutive rectal polyp status post cold biopsy.  Otherwise, normal rectum. Left sided and  transverse diverticula.  Diminutive polyp in the ascending colon, cold bx. hyperplastic polyps.   Marland Kitchen [place] and 2001  . ESOPHAGOGASTRODUODENOSCOPY   07/17/2006   Dr. Rourk:single 2 cm distal esophageal erosion consistent with erosive reflux esophagitis, otherwise normal  . ESOPHAGOGASTRODUODENOSCOPY N/A 09/10/2013   Dr. Rourk:Schatzki's ring - status post Venia Minks dilation. Mild erosive reflux esophagitis. Hiatal hernia with fosamax capsule lodged as above  . MALONEY DILATION N/A 09/10/2013   Procedure: Venia Minks  DILATION;  Surgeon: Daneil Dolin, MD;  [facility];  Service: Endoscopy;  Laterality: N/A;  . OTHER SURGICAL HISTORY    . REPLACEMENT TOTAL KNEE BILATERAL     2000,2001  . SAVORY DILATION N/A 09/10/2013   Procedure: SAVORY DILATION;  Surgeon: Daneil Dolin, MD;  [facility];  Service: Endoscopy;  Laterality: N/A;  . TONSILLECTOMY  1957    Current Outpatient Medications  Medication Sig Dispense Refill  . aspirin 81 MG tablet Take 81 mg by mouth daily.      . benazepril-hydrochlorthiazide (LOTENSIN HCT) 20-12.5 MG tablet TAKE 2 TABLETS BY MOUTH DAILY 180 tablet 1  . calcium carbonate (OSCAL) 1500 (600 CA) MG TABS tablet Take 2 tablets by mouth 1 day or 1 dose.    . citalopram (CELEXA) 40 MG tablet TAKE ONE TABLET BY MOUTH ONCE DAILY (Patient taking differently: TAKE ONE TABLET BY MOUTH ONCE DAILY. Takes 10mg  daily) 90 tablet 1  . DILT-XR 240 MG 24 hr capsule TAKE 1 CAPSULE BY MOUTH DAILY 90 capsule 1  . docusate sodium (COLACE) 100 MG capsule Take 100 mg by mouth daily.    . fenofibrate (TRICOR) 145 MG tablet TAKE 1 TABLET BY MOUTH DAILY AT BEDTIME 90 tablet 1  . fish oil-omega-3 fatty acids 1000 MG capsule Take 1 g by mouth daily. 2 caps bid     . fluticasone (FLONASE) 50 MCG/ACT nasal spray USE 2 SPRAY(S) IN EACH NOSTRIL ONCE DAILY 48 g 1  . furosemide (LASIX) 40 MG tablet TAKE ONE TABLET BY MOUTH ONCE DAILY 30 tablet 4  . gabapentin (NEURONTIN) 100 MG capsule Three capsules twice daily 180 capsule 3  . glucose blood (ONETOUCH VERIO) test strip Use as instructed tid. E11.65 100 each 5  . insulin NPH-regular Human (NOVOLIN 70/30 RELION) (70-30) 100 UNIT/ML injection INJECT 15 UNITS SUBCUTANEOUSLY TWICE DAILY WITH  A  MEAL 20 mL 2  . L-Methylfolate-Algae-B12-B6 (METANX) 3-90.314-2-35 MG CAPS Take 1 capsule by mouth daily. 90 capsule 1  . levothyroxine (SYNTHROID, LEVOTHROID) 75 MCG tablet Take 1 tablet (75 mcg total) by mouth daily before breakfast. 30 tablet 3  .  linaclotide (LINZESS) 72 MCG capsule Take 72 mcg daily before breakfast by mouth.    . Multiple Vitamin (MULTIVITAMIN) tablet Take 1 tablet by mouth daily.    Marland Kitchen oxyCODONE-acetaminophen (PERCOCET) 10-325 MG tablet One tablet twice daily 60 tablet 0  . pantoprazole (PROTONIX) 40 MG tablet TAKE ONE TABLET BY MOUTH ONCE DAILY 30 tablet 5  . ranitidine (ZANTAC) 150 MG tablet TAKE ONE TABLET BY MOUTH AT BEDTIME 90 tablet 1  . rosuvastatin (CRESTOR) 40 MG tablet TAKE ONE TABLET BY MOUTH ONCE DAILY 90 tablet 1   No current facility-administered medications for this visit.     Allergies as of 11/15/2016 - Review Complete 11/15/2016  Allergen Reaction Noted  . Lisinopril  Nausea And Vomiting   . Reclast [zoledronic acid] Other (See Comments) 02/23/2014  . Alprazolam Other (See Comments)   . Losartan Other (See Comments) 04/08/2013  . Sertraline hcl Diarrhea 05/24/2008  . Sulfonamide derivatives Rash     Family History  Problem Relation Age of Onset  . Dementia Mother   . Heart disease Father   . Diabetes Unknown   . Arthritis Unknown   . Colon cancer Neg Hx     Social History   Socioeconomic History  . Marital status: Married    Spouse name: None  . Number of children: None  . Years of education: 77  . Highest education level: None  Social Needs  . Financial resource strain: None  . Food insecurity - worry: None  . Food insecurity - inability: None  . Transportation needs - medical: None  . Transportation needs - non-medical: None  Occupational History  . Occupation: retired    Fish farm manager: RETIRED  Tobacco Use  . Smoking status: Never Smoker  . Smokeless tobacco: Never Used  Substance and Sexual Activity  . Alcohol use: No    Alcohol/week: 0.0 oz  . Drug use: No  . Sexual activity: Not Currently  Other Topics Concern  . None  Social History Narrative  . None    Review of Systems: General: Negative for anorexia, weight loss, fever, chills, fatigue, weakness. Eyes:  Negative for vision changes.  ENT: Negative for hoarseness. Admits solid food dysphagia. CV: Negative for chest pain, angina, palpitations, peripheral edema.  Respiratory: Negative for dyspnea at rest, cough, sputum, wheezing.  GI: See history of present illness. MS: Admits chronic hip pain related to bursitis.  Derm: Negative for rash or itching.  Endo: Negative for unusual weight change.  Heme: Negative for bruising or bleeding. Allergy: Negative for rash or hives.   Physical Exam: BP (!) 161/69   Pulse (!) 56   Temp (!) 97.3 F (36.3 C) (Oral)   Ht 5\' 2"  (1.575 m)   Wt 171 lb (77.6 kg)   BMI 31.28 kg/m  General:   Alert and oriented. Pleasant and cooperative. Well-nourished and well-developed. Ambulates with a walker. Eyes:  Without icterus, sclera clear and conjunctiva pink.  Ears:  Normal auditory acuity. Cardiovascular:  S1, S2 present without murmurs appreciated. Extremities without clubbing or edema. Respiratory:  Clear to auscultation bilaterally. No wheezes, rales, or rhonchi. No distress.  Gastrointestinal:  +BS, soft, non-tender and non-distended. No HSM noted. No guarding or rebound. No masses appreciated.  Rectal:  Deferred  Musculoskalatal:  Symmetrical without gross deformities. Normal posture. Neurologic:  Alert and oriented x4;  grossly normal neurologically. Psych:  Alert and cooperative. Normal mood and affect. Heme/Lymph/Immune: No excessive bruising noted.    11/15/2016 9:16 AM   Disclaimer: This note was dictated with voice recognition software. Similar sounding words can inadvertently be transcribed and may not be corrected upon review.

## 2016-11-28 LAB — RENAL FUNCTION PANEL
Albumin: 3.9 g/dL (ref 3.6–5.1)
BUN/Creatinine Ratio: 17 (calc) (ref 6–22)
BUN: 22 mg/dL (ref 7–25)
CALCIUM: 9.6 mg/dL (ref 8.6–10.4)
CO2: 30 mmol/L (ref 20–32)
Chloride: 105 mmol/L (ref 98–110)
Creat: 1.3 mg/dL — ABNORMAL HIGH (ref 0.60–0.88)
GLUCOSE: 113 mg/dL — AB (ref 65–99)
POTASSIUM: 4.9 mmol/L (ref 3.5–5.3)
Phosphorus: 3.7 mg/dL (ref 2.1–4.3)
Sodium: 143 mmol/L (ref 135–146)

## 2016-11-28 LAB — HEMOGLOBIN A1C
EAG (MMOL/L): 9.3 (calc)
HEMOGLOBIN A1C: 7.5 %{Hb} — AB (ref <5.7)
MEAN PLASMA GLUCOSE: 169 (calc)

## 2016-12-03 ENCOUNTER — Ambulatory Visit (INDEPENDENT_AMBULATORY_CARE_PROVIDER_SITE_OTHER): Payer: Medicare HMO | Admitting: "Endocrinology

## 2016-12-03 ENCOUNTER — Encounter: Payer: Self-pay | Admitting: "Endocrinology

## 2016-12-03 ENCOUNTER — Ambulatory Visit: Payer: Medicare HMO | Admitting: "Endocrinology

## 2016-12-03 VITALS — BP 159/78 | HR 81 | Ht 62.0 in | Wt 173.0 lb

## 2016-12-03 DIAGNOSIS — I10 Essential (primary) hypertension: Secondary | ICD-10-CM | POA: Diagnosis not present

## 2016-12-03 DIAGNOSIS — E1121 Type 2 diabetes mellitus with diabetic nephropathy: Secondary | ICD-10-CM | POA: Diagnosis not present

## 2016-12-03 DIAGNOSIS — E039 Hypothyroidism, unspecified: Secondary | ICD-10-CM

## 2016-12-03 DIAGNOSIS — E782 Mixed hyperlipidemia: Secondary | ICD-10-CM

## 2016-12-03 NOTE — Progress Notes (Signed)
Subjective:    Patient ID: [name] [name], female    DOB: [birth year], PCP Glenda Chroman, MD   Past Medical History:  Diagnosis Date  . Abdominal bloating   . Acute cystitis   . Anticoagulant long-term use    Coumadin  stopped...severe hematoma  from fall  . Aortic valve sclerosis    Ef 60 %...echo...july 2010   . Arthritis   . Atrial flutter ([place]) febuary 2008      atrial flutter... DC Cardioversion...successful... holding sinus as of january 2011 coumadin therapy...discontinued...severe hematoma secondary to fall  . Back pain    with radiculopathy  . Basal cell carcinoma 01/2015   nose  . Carotid artery disease ([place])    Doppler, [facility], February 21, 2012, less than 50% bilateral carotid stenoses  . Chronic granulomatous disease ([place])    right chest ... ct chest... novmber 2009  . Constipation   . Depression with anxiety   . Diabetes mellitus    type 2  . Diverticulosis of colon   . Edema    Painful, April, 2012  . Ejection fraction    EF 60-65%, echo, April 13, 2010, normal RV function, mild mitral regurgitation  . Erosive esophagitis   . Fatigue   . GERD (gastroesophageal reflux disease)   . Hematochezia   . Hematoma    ...buttocks secondary to fall november 2009 with coumadin  coumadin stopped  . Hypercholesterolemia   . Hyperlipidemia   . Hypertension   . IBS (irritable bowel syndrome)   . Mitral regurgitation 2010   mild ...echo...2010  . Nausea   . Normal nuclear stress test    september ,2007...no ischemia  . Obesity   . Osteoarthritis   . Sleep apnea   . Visual loss    unspecified   Past Surgical History:  Procedure Laterality Date  . ABDOMINAL HYSTERECTOMY  2001  . APPENDECTOMY  1948  . BASAL CELL CARCINOMA EXCISION  09/2009   right forarm and back of neck and left side   . bilateral bunion removel  [id number]  . CATARACT EXTRACTION  2001   right   . CATARACT EXTRACTION     left 2005  . COLONOSCOPY   07/17/2006   Dr. Rourk:anal  canal hemorrhoids, diminutive rectal polyp status post cold biopsy.  Otherwise, normal rectum. Left sided and  transverse diverticula.  Diminutive polyp in the ascending colon, cold bx. hyperplastic polyps.   Marland Kitchen [place] and 2001  . ESOPHAGOGASTRODUODENOSCOPY   07/17/2006   Dr. Rourk:single 2 cm distal esophageal erosion consistent with erosive reflux esophagitis, otherwise normal  . ESOPHAGOGASTRODUODENOSCOPY N/A 09/10/2013   Dr. Rourk:Schatzki's ring - status post Venia Minks dilation. Mild erosive reflux esophagitis. Hiatal hernia with fosamax capsule lodged as above  . MALONEY DILATION N/A 09/10/2013   Procedure: Venia Minks DILATION;  Surgeon: Daneil Dolin, MD;  Location: [facility];  Service: Endoscopy;  Laterality: N/A;  . OTHER SURGICAL HISTORY    . REPLACEMENT TOTAL KNEE BILATERAL     2000,2001  . SAVORY DILATION N/A 09/10/2013   Procedure: SAVORY DILATION;  Surgeon: Daneil Dolin, MD;  Location: [facility];  Service: Endoscopy;  Laterality: N/A;  . TONSILLECTOMY  1957   Social History   Socioeconomic History  . Marital status: Married    Spouse name: None  . Number of children: None  . Years of education: 5  . Highest education level: None  Social Needs  .  Financial resource strain: None  . Food insecurity - worry: None  . Food insecurity - inability: None  . Transportation needs - medical: None  . Transportation needs - non-medical: None  Occupational History  . Occupation: retired    Fish farm manager: RETIRED  Tobacco Use  . Smoking status: Never Smoker  . Smokeless tobacco: Never Used  Substance and Sexual Activity  . Alcohol use: No    Alcohol/week: 0.0 oz  . Drug use: No  . Sexual activity: Not Currently  Other Topics Concern  . None  Social History Narrative  . None   Outpatient Encounter Medications as of 12/03/2016  Medication Sig  . aspirin 81 MG tablet Take 81 mg by mouth daily.    . benazepril-hydrochlorthiazide (LOTENSIN HCT)  20-12.5 MG tablet TAKE 2 TABLETS BY MOUTH DAILY  . calcium carbonate (OSCAL) 1500 (600 CA) MG TABS tablet Take 2 tablets by mouth 1 day or 1 dose.  . ciprofloxacin (CIPRO) 250 MG tablet   . citalopram (CELEXA) 40 MG tablet TAKE ONE TABLET BY MOUTH ONCE DAILY (Patient taking differently: TAKE ONE TABLET BY MOUTH ONCE DAILY. Takes 10mg  daily)  . DILT-XR 240 MG 24 hr capsule TAKE 1 CAPSULE BY MOUTH DAILY  . docusate sodium (COLACE) 100 MG capsule Take 100 mg by mouth daily.  . fenofibrate (TRICOR) 145 MG tablet TAKE 1 TABLET BY MOUTH DAILY AT BEDTIME  . fish oil-omega-3 fatty acids 1000 MG capsule Take 1 g by mouth daily. 2 caps bid   . fluticasone (FLONASE) 50 MCG/ACT nasal spray USE 2 SPRAY(S) IN EACH NOSTRIL ONCE DAILY  . furosemide (LASIX) 40 MG tablet TAKE ONE TABLET BY MOUTH ONCE DAILY  . gabapentin (NEURONTIN) 100 MG capsule Three capsules twice daily  . glucose blood (ONETOUCH VERIO) test strip Use as instructed tid. E11.65  . insulin NPH-regular Human (NOVOLIN 70/30 RELION) (70-30) 100 UNIT/ML injection INJECT 15 UNITS SUBCUTANEOUSLY TWICE DAILY WITH  A  MEAL  . L-Methylfolate-Algae-B12-B6 (METANX) 3-90.314-2-35 MG CAPS Take 1 capsule by mouth daily.  Marland Kitchen levothyroxine (SYNTHROID, LEVOTHROID) 75 MCG tablet Take 1 tablet (75 mcg total) by mouth daily before breakfast.  . linaclotide (LINZESS) 72 MCG capsule Take 1 capsule (72 mcg total) daily before breakfast by mouth.  . Multiple Vitamin (MULTIVITAMIN) tablet Take 1 tablet by mouth daily.  Marland Kitchen oxyCODONE-acetaminophen (PERCOCET) 10-325 MG tablet One tablet twice daily  . pantoprazole (PROTONIX) 40 MG tablet TAKE ONE TABLET BY MOUTH ONCE DAILY  . ranitidine (ZANTAC) 150 MG tablet TAKE ONE TABLET BY MOUTH AT BEDTIME  . rosuvastatin (CRESTOR) 40 MG tablet TAKE ONE TABLET BY MOUTH ONCE DAILY   No facility-administered encounter medications on file as of 12/03/2016.    ALLERGIES: Allergies  Allergen Reactions  . Lisinopril Nausea And Vomiting   . Reclast [Zoledronic Acid] Other (See Comments)    Jaw pain, and possible deterioration in kidney function  . Alprazolam Other (See Comments)    hyper  . Losartan Other (See Comments)    Raw throat  . Sertraline Hcl Diarrhea  . Sulfonamide Derivatives Rash   VACCINATION STATUS: Immunization History  Administered Date(s) Administered  . H1N1 11/04/2007  . Influenza Split 10/12/2010, 09/28/2011  . Influenza Whole 10/25/2005, 11/01/2008, 10/04/2009  . Influenza,inj,Quad PF,6+ Mos 11/10/2012, 10/29/2013, 09/28/2014, 10/31/2015  . Pneumococcal Conjugate-13 05/04/2014  . Pneumococcal Polysaccharide-23 05/27/2003  . Td 10/04/2009  . Zoster 03/25/2006    Diabetes  She presents for her follow-up diabetic visit. She has type 2 diabetes mellitus. Onset time:  She was diagnosed at approximate age of 68 years. Her disease course has been improving. There are no hypoglycemic associated symptoms. Pertinent negatives for hypoglycemia include no confusion, headaches, pallor or seizures. Pertinent negatives for diabetes include no chest pain, no polydipsia, no polyphagia and no polyuria. There are no hypoglycemic complications. Symptoms are improving. Diabetic complications include nephropathy. Risk factors for coronary artery disease include diabetes mellitus, dyslipidemia, hypertension and sedentary lifestyle. When asked about current treatments, none were reported. She is compliant with treatment some of the time. Her weight is decreasing steadily. She is following a generally unhealthy diet. She has not had a previous visit with a dietitian. She never participates in exercise. Her breakfast blood glucose range is generally 140-180 mg/dl. Her dinner blood glucose range is generally 140-180 mg/dl. Her overall blood glucose range is 140-180 mg/dl. An ACE inhibitor/angiotensin II receptor blocker is being taken. Eye exam is current.  Thyroid Problem  Presents for initial visit. Onset time: she is being  diagnosed with primary hypothyroidism today. Patient reports no cold intolerance, diarrhea, heat intolerance or palpitations. Past treatments include nothing. The following procedures have not been performed: radioiodine uptake scan, thyroid FNA, thyroid ultrasound and thyroidectomy. Her past medical history is significant for hyperlipidemia.  Hyperlipidemia  This is a chronic problem. The current episode started more than 1 year ago. Pertinent negatives include no chest pain, myalgias or shortness of breath. Current antihyperlipidemic treatment includes statins.  Hypertension  This is a chronic problem. The current episode started more than 1 year ago. The problem is controlled. Pertinent negatives include no chest pain, headaches, palpitations or shortness of breath. Past treatments include ACE inhibitors. Identifiable causes of hypertension include a thyroid problem.     Review of Systems  Constitutional: Negative for unexpected weight change.  HENT: Negative for trouble swallowing and voice change.   Eyes: Negative for visual disturbance.  Respiratory: Negative for cough, shortness of breath and wheezing.   Cardiovascular: Negative for chest pain, palpitations and leg swelling.  Gastrointestinal: Negative for diarrhea, nausea and vomiting.  Endocrine: Negative for cold intolerance, heat intolerance, polydipsia, polyphagia and polyuria.  Musculoskeletal: Positive for arthralgias and back pain. Negative for myalgias.  Skin: Negative for color change, pallor, rash and wound.  Neurological: Negative for seizures and headaches.  Psychiatric/Behavioral: Negative for confusion and suicidal ideas.    Objective:    BP (!) 159/78   Pulse 81   Ht 5\' 2"  (1.575 m)   Wt 173 lb (78.5 kg)   BMI 31.64 kg/m   Wt Readings from Last 3 Encounters:  12/03/16 173 lb (78.5 kg)  11/15/16 171 lb (77.6 kg)  07/30/16 182 lb 3.2 oz (82.6 kg)    Physical Exam  Constitutional: She is oriented to person,  place, and time. She appears well-developed.  Walks with her walker.  HENT:  Head: Normocephalic and atraumatic.  Eyes: EOM are normal.  Neck: Normal range of motion. Neck supple. No tracheal deviation present. No thyromegaly present.  Cardiovascular: Normal rate and regular rhythm.  Pulmonary/Chest: Effort normal and breath sounds normal.  Abdominal: Soft. Bowel sounds are normal. There is no tenderness. There is no guarding.  Musculoskeletal: She exhibits no edema.  Walks with her walker.  Neurological: She is alert and oriented to person, place, and time. She has normal reflexes. No cranial nerve deficit. Coordination normal.  Skin: Skin is warm and dry. No rash noted. No erythema. No pallor.  Psychiatric: She has a normal mood and affect. Judgment normal.  Results for orders placed or performed in visit on 07/30/16  Renal function panel  Result Value Ref Range   Glucose, Bld 113 (H) 65 - 99 mg/dL   BUN 22 7 - 25 mg/dL   Creat 1.30 (H) 0.60 - 0.88 mg/dL   BUN/Creatinine Ratio 17 6 - 22 (calc)   Sodium 143 135 - 146 mmol/L   Potassium 4.9 3.5 - 5.3 mmol/L   Chloride 105 98 - 110 mmol/L   CO2 30 20 - 32 mmol/L   Calcium 9.6 8.6 - 10.4 mg/dL   Phosphorus 3.7 2.1 - 4.3 mg/dL   Albumin 3.9 3.6 - 5.1 g/dL  Hemoglobin A1c  Result Value Ref Range   Hgb A1c MFr Bld 7.5 (H) <5.7 % of total Hgb   Mean Plasma Glucose 169 (calc)   eAG (mmol/L) 9.3 (calc)   Complete Blood Count (Most recent): Lab Results  Component Value Date   WBC 8.3 10/29/2015   HGB 11.7 10/29/2015   HCT 36.2 10/29/2015   MCV 94.8 10/29/2015   PLT 186 10/29/2015   Chemistry (most recent):  Diabetic Labs (most recent): Lab Results  Component Value Date   HGBA1C 7.5 (H) 11/27/2016   HGBA1C 7.1 (H) 07/24/2016   HGBA1C 7.0 (H) 04/21/2016   Lipid Panel     Component Value Date/Time   CHOL 151 04/21/2016 0813   TRIG 100 04/21/2016 0813   HDL 73 04/21/2016 0813   CHOLHDL 2.1 04/21/2016 0813   VLDL 20  04/21/2016 0813   LDLCALC 58 04/21/2016 0813     Assessment & Plan:   1. Diabetes mellitus with nephropathy ([place])  - Her diabetes is complicated by CKD, and she  remains at a high risk for more acute and chronic complications of diabetes which include CAD, CVA, CKD, retinopathy, and neuropathy. These are all discussed in detail with the patient.  Patient came with controlled glucose profile, and  recent A1c is  Stable at 7.5%, generally improving from  9.5 %. .  -  Recent labs reviewed.   - I have re-counseled the patient on diet management   by adopting a carbohydrate restricted / protein rich  Diet.  -  Suggestion is made for her to avoid simple carbohydrates  from her diet including Cakes, Sweet Desserts / Pastries, Ice Cream, Soda (diet and regular), Sweet Tea, Candies, Chips, Cookies, Store Bought Juices, Alcohol in Excess of  1-2 drinks a day, Artificial Sweeteners, and "Sugar-free" Products. This will help patient to have stable blood glucose profile and potentially avoid unintended weight gain.  - Patient is advised to stick to a routine mealtimes to eat 3 meals  a day and avoid unnecessary snacks ( to snack only to correct hypoglycemia).  - I have approached patient with the following individualized plan to manage diabetes and patient agrees.  - Given the long duration of her diabetes, she will continue to need insulin therapy, she did reasonably well on premixed Novolin 70/30. -  I advised her to continue  Novolin 70/30   15 units before breakfast and 15 units with supper, for pre-meal blood glucose above 90 mg/dL.  - She will continue to monitor blood glucose strictly before breakfast and supper and at any other time as needed.  - Patient is warned not to take insulin without proper monitoring per orders. -Adjustment parameters are given for hypo and hyperglycemia in writing. -Patient is encouraged to call clinic for blood glucose levels less than 70 or above 300 mg  /  dl.  -Patient is not a candidate for MTF, SGLT2 i  due to CKD. Her renal function is observed to have improved significantly.  - Patient specific target  for A1c; LDL, HDL, Triglycerides, and  Waist Circumference were discussed in detail.  2) BP/HTN: controlled. Continue current medications including ACEI/ARB. 3) Lipids/HPL:  continue statins. 4)  Weight/Diet: CDE consult in progress, exercise, and carbohydrates information provided.  5) hypothyroidism:   I advised her to continue levothyroxine  75 g by mouth every morning.  - We discussed about correct intake of levothyroxine, at fasting, with water, separated by at least 30 minutes from breakfast, and separated by more than 4 hours from calcium, iron, multivitamins, acid reflux medications (PPIs). -Patient is made aware of the fact that thyroid hormone replacement is needed for life, dose to be adjusted by periodic monitoring of thyroid function tests.  6) Chronic Care/Health Maintenance:  -Patient is on ACEI/ARB and Statin medications and encouraged to continue to follow up with Ophthalmology, Podiatrist at least yearly or according to recommendations, and advised to  stay away from smoking. I have recommended yearly flu vaccine and pneumonia vaccination at least every 5 years; moderate intensity exercise for up to 150 minutes weekly; and  sleep for at least 7 hours a day. - I advised patient to maintain close follow up with Glenda Chroman, MD for primary care needs. - Time spent with the patient: 25 min, of which >50% was spent in reviewing her sugar logs , discussing her hypo- and hyper-glycemic episodes, reviewing her current and  previous labs and insulin doses and developing a plan to avoid hypo- and hyper-glycemia.   Follow up plan: -Return in about 4 months (around 04/03/2017) for follow up with pre-visit labs, meter, and logs.  Glade Lloyd, MD Phone: [phone]  Fax: [phone]  -  This note was partially dictated with voice  recognition software. Similar sounding words can be transcribed inadequately or may not  be corrected upon review.  12/03/2016, 11:42 AM

## 2016-12-14 ENCOUNTER — Ambulatory Visit (HOSPITAL_COMMUNITY)
Admission: RE | Admit: 2016-12-14 | Discharge: 2016-12-14 | Disposition: A | Discharge status: Home / Self Care | Payer: Medicare HMO | Source: Ambulatory Visit | Attending: Internal Medicine | Admitting: Internal Medicine

## 2016-12-14 ENCOUNTER — Encounter (HOSPITAL_COMMUNITY): Payer: Self-pay | Admitting: *Deleted

## 2016-12-14 ENCOUNTER — Other Ambulatory Visit: Payer: Self-pay

## 2016-12-14 ENCOUNTER — Encounter (HOSPITAL_COMMUNITY)
Admission: RE | Disposition: A | Discharge status: Home / Self Care | Payer: Self-pay | Source: Ambulatory Visit | Attending: Internal Medicine

## 2016-12-14 DIAGNOSIS — G8929 Other chronic pain: Secondary | ICD-10-CM | POA: Insufficient documentation

## 2016-12-14 DIAGNOSIS — Z794 Long term (current) use of insulin: Secondary | ICD-10-CM | POA: Insufficient documentation

## 2016-12-14 DIAGNOSIS — Z882 Allergy status to sulfonamides status: Secondary | ICD-10-CM | POA: Insufficient documentation

## 2016-12-14 DIAGNOSIS — R1319 Other dysphagia: Secondary | ICD-10-CM

## 2016-12-14 DIAGNOSIS — Z833 Family history of diabetes mellitus: Secondary | ICD-10-CM | POA: Insufficient documentation

## 2016-12-14 DIAGNOSIS — Z9841 Cataract extraction status, right eye: Secondary | ICD-10-CM | POA: Insufficient documentation

## 2016-12-14 DIAGNOSIS — Z90710 Acquired absence of both cervix and uterus: Secondary | ICD-10-CM | POA: Insufficient documentation

## 2016-12-14 DIAGNOSIS — I4892 Unspecified atrial flutter: Secondary | ICD-10-CM | POA: Diagnosis not present

## 2016-12-14 DIAGNOSIS — Z7901 Long term (current) use of anticoagulants: Secondary | ICD-10-CM | POA: Insufficient documentation

## 2016-12-14 DIAGNOSIS — E119 Type 2 diabetes mellitus without complications: Secondary | ICD-10-CM | POA: Insufficient documentation

## 2016-12-14 DIAGNOSIS — I10 Essential (primary) hypertension: Secondary | ICD-10-CM | POA: Insufficient documentation

## 2016-12-14 DIAGNOSIS — M1990 Unspecified osteoarthritis, unspecified site: Secondary | ICD-10-CM | POA: Diagnosis not present

## 2016-12-14 DIAGNOSIS — E669 Obesity, unspecified: Secondary | ICD-10-CM | POA: Diagnosis not present

## 2016-12-14 DIAGNOSIS — M5410 Radiculopathy, site unspecified: Secondary | ICD-10-CM | POA: Diagnosis not present

## 2016-12-14 DIAGNOSIS — D71 Functional disorders of polymorphonuclear neutrophils: Secondary | ICD-10-CM | POA: Insufficient documentation

## 2016-12-14 DIAGNOSIS — Z85828 Personal history of other malignant neoplasm of skin: Secondary | ICD-10-CM | POA: Diagnosis not present

## 2016-12-14 DIAGNOSIS — K5900 Constipation, unspecified: Secondary | ICD-10-CM | POA: Insufficient documentation

## 2016-12-14 DIAGNOSIS — K222 Esophageal obstruction: Secondary | ICD-10-CM | POA: Diagnosis not present

## 2016-12-14 DIAGNOSIS — Z96653 Presence of artificial knee joint, bilateral: Secondary | ICD-10-CM | POA: Insufficient documentation

## 2016-12-14 DIAGNOSIS — I340 Nonrheumatic mitral (valve) insufficiency: Secondary | ICD-10-CM | POA: Insufficient documentation

## 2016-12-14 DIAGNOSIS — Z888 Allergy status to other drugs, medicaments and biological substances status: Secondary | ICD-10-CM | POA: Insufficient documentation

## 2016-12-14 DIAGNOSIS — Z79899 Other long term (current) drug therapy: Secondary | ICD-10-CM | POA: Insufficient documentation

## 2016-12-14 DIAGNOSIS — R5383 Other fatigue: Secondary | ICD-10-CM | POA: Insufficient documentation

## 2016-12-14 DIAGNOSIS — F329 Major depressive disorder, single episode, unspecified: Secondary | ICD-10-CM | POA: Insufficient documentation

## 2016-12-14 DIAGNOSIS — K589 Irritable bowel syndrome without diarrhea: Secondary | ICD-10-CM | POA: Diagnosis not present

## 2016-12-14 DIAGNOSIS — R1310 Dysphagia, unspecified: Secondary | ICD-10-CM

## 2016-12-14 DIAGNOSIS — Z8249 Family history of ischemic heart disease and other diseases of the circulatory system: Secondary | ICD-10-CM | POA: Insufficient documentation

## 2016-12-14 DIAGNOSIS — Z6831 Body mass index (BMI) 31.0-31.9, adult: Secondary | ICD-10-CM | POA: Insufficient documentation

## 2016-12-14 DIAGNOSIS — Z8261 Family history of arthritis: Secondary | ICD-10-CM | POA: Insufficient documentation

## 2016-12-14 DIAGNOSIS — G4730 Sleep apnea, unspecified: Secondary | ICD-10-CM | POA: Diagnosis not present

## 2016-12-14 DIAGNOSIS — K449 Diaphragmatic hernia without obstruction or gangrene: Secondary | ICD-10-CM | POA: Insufficient documentation

## 2016-12-14 DIAGNOSIS — Z9842 Cataract extraction status, left eye: Secondary | ICD-10-CM | POA: Insufficient documentation

## 2016-12-14 DIAGNOSIS — Z7982 Long term (current) use of aspirin: Secondary | ICD-10-CM | POA: Insufficient documentation

## 2016-12-14 DIAGNOSIS — E7800 Pure hypercholesterolemia, unspecified: Secondary | ICD-10-CM | POA: Insufficient documentation

## 2016-12-14 DIAGNOSIS — F418 Other specified anxiety disorders: Secondary | ICD-10-CM | POA: Insufficient documentation

## 2016-12-14 DIAGNOSIS — K5909 Other constipation: Secondary | ICD-10-CM | POA: Insufficient documentation

## 2016-12-14 DIAGNOSIS — K219 Gastro-esophageal reflux disease without esophagitis: Secondary | ICD-10-CM | POA: Insufficient documentation

## 2016-12-14 HISTORY — PX: MALONEY DILATION: SHX5535

## 2016-12-14 HISTORY — PX: ESOPHAGOGASTRODUODENOSCOPY: SHX5428

## 2016-12-14 LAB — GLUCOSE, CAPILLARY: GLUCOSE-CAPILLARY: 157 mg/dL — AB (ref 65–99)

## 2016-12-14 SURGERY — EGD (ESOPHAGOGASTRODUODENOSCOPY)
Anesthesia: Moderate Sedation

## 2016-12-14 MED ORDER — ONDANSETRON HCL 4 MG/2ML IJ SOLN
INTRAMUSCULAR | Status: DC | PRN
  Administered 2016-12-14: 4 mg via INTRAVENOUS

## 2016-12-14 MED ORDER — ONDANSETRON HCL 4 MG/2ML IJ SOLN
INTRAMUSCULAR | Status: AC
  Filled 2016-12-14: qty 2

## 2016-12-14 MED ORDER — MIDAZOLAM HCL 5 MG/5ML IJ SOLN
INTRAMUSCULAR | Status: DC | PRN
  Administered 2016-12-14 (×2): 1 mg via INTRAVENOUS

## 2016-12-14 MED ORDER — MEPERIDINE HCL 100 MG/ML IJ SOLN
INTRAMUSCULAR | Status: AC
  Filled 2016-12-14: qty 1

## 2016-12-14 MED ORDER — SODIUM CHLORIDE 0.9 % IV SOLN
INTRAVENOUS | Status: DC
  Administered 2016-12-14: 1000 mL via INTRAVENOUS

## 2016-12-14 MED ORDER — LIDOCAINE VISCOUS 2 % MT SOLN
OROMUCOSAL | Status: DC | PRN
  Administered 2016-12-14: 1 via OROMUCOSAL

## 2016-12-14 MED ORDER — LIDOCAINE VISCOUS 2 % MT SOLN
OROMUCOSAL | Status: AC
  Filled 2016-12-14: qty 15

## 2016-12-14 MED ORDER — MEPERIDINE HCL 100 MG/ML IJ SOLN
INTRAMUSCULAR | Status: DC | PRN
  Administered 2016-12-14: 25 mg via INTRAVENOUS

## 2016-12-14 MED ORDER — MIDAZOLAM HCL 5 MG/5ML IJ SOLN
INTRAMUSCULAR | Status: AC
  Filled 2016-12-14: qty 5

## 2016-12-14 NOTE — Discharge Instructions (Signed)
EGD Discharge instructions Please read the instructions outlined below and refer to this sheet in the next few weeks. These discharge instructions provide you with general information on caring for yourself after you leave the hospital. Your doctor may also give you specific instructions. While your treatment has been planned according to the most current medical practices available, unavoidable complications occasionally occur. If you have any problems or questions after discharge, please call your doctor. ACTIVITY  You may resume your regular activity but move at a slower pace for the next 24 hours.   Take frequent rest periods for the next 24 hours.   Walking will help expel (get rid of) the air and reduce the bloated feeling in your abdomen.   No driving for 24 hours (because of the anesthesia (medicine) used during the test).   You may shower.   Do not sign any important legal documents or operate any machinery for 24 hours (because of the anesthesia used during the test).  NUTRITION  Drink plenty of fluids.   You may resume your normal diet.   Begin with a light meal and progress to your normal diet.   Avoid alcoholic beverages for 24 hours or as instructed by your caregiver.  MEDICATIONS  You may resume your normal medications unless your caregiver tells you otherwise.  WHAT YOU CAN EXPECT TODAY  You may experience abdominal discomfort such as a feeling of fullness or gas pains.  FOLLOW-UP  Your doctor will discuss the results of your test with you.  SEEK IMMEDIATE MEDICAL ATTENTION IF ANY OF THE FOLLOWING OCCUR:  Excessive nausea (feeling sick to your stomach) and/or vomiting.   Severe abdominal pain and distention (swelling).   Trouble swallowing.   Temperature over 101 F (37.8 C).   Rectal bleeding or vomiting of blood.   Continue Protonix 40 mg daily  Office visit with [place] in one year.

## 2016-12-14 NOTE — Op Note (Signed)
[facility] Patient Name: [name] [name] Procedure Date: 12/14/2016 12:31 PM MRN: [id number] Date of Birth: 1929-11-05 Attending MD: Norvel Richards , MD CSN: [id number] Age: 83 Admit Type: Outpatient Procedure:                Upper GI endoscopy Indications:              Dysphagia Providers:                Norvel Richards, MD, Janeece Riggers, RN, Nelma Rothman, Technician, Randa Spike, Technician Referring MD:              Medicines:                Midazolam 2 mg IV, Meperidine 25 mg IV, Ondansetron                            4 mg IV Complications:            No immediate complications. Estimated Blood Loss:     Estimated blood loss: none. Procedure:                Pre-Anesthesia Assessment:                           - Prior to the procedure, a History and Physical                            was performed, and patient medications and                            allergies were reviewed. The patient's tolerance of                            previous anesthesia was also reviewed. The risks                            and benefits of the procedure and the sedation                            options and risks were discussed with the patient.                            All questions were answered, and informed consent                            was obtained. Prior Anticoagulants: The patient has                            taken no previous anticoagulant or antiplatelet                            agents. ASA Grade Assessment: II - A patient with  mild systemic disease. After reviewing the risks                            and benefits, the patient was deemed in                            satisfactory condition to undergo the procedure.                           After obtaining informed consent, the endoscope was                            passed under direct vision. Throughout the                            procedure, the patient's  blood pressure, pulse, and                            oxygen saturations were monitored continuously. The                            [id number] ([id number]) scope was introduced through the                            mouth, and advanced to the second part of duodenum.                            The upper GI endoscopy was accomplished without                            difficulty. The patient tolerated the procedure                            well. Scope In: 1:27:59 PM Scope Out: 1:35:33 PM Total Procedure Duration: 0 hours 7 minutes 34 seconds  Findings:      A mild Schatzki ring (acquired) was found at the gastroesophageal       junction. The scope was withdrawn. Dilation was performed with a Maloney       dilator with no resistance at 67 Fr. The scope was withdrawn. Dilation       was performed with a Maloney dilator with mild resistance at 56 Fr. The       dilation site was examined following endoscope reinsertion and showed       mild improvement in luminal narrowing. Estimated blood loss: none.      A medium-sized hiatal hernia was present.      The cardia and gastric fundus were normal on retroflexion.      The duodenal bulb and second portion of the duodenum were normal. Impression:               - Mild Schatzki ring. Dilated.                           - Medium-sized hiatal hernia.                           -  Normal duodenal bulb and second portion of the                            duodenum.                           - No specimens collected. Moderate Sedation:      Moderate (conscious) sedation was administered by the endoscopy nurse       and supervised by the endoscopist. The following parameters were       monitored: oxygen saturation, heart rate, blood pressure, respiratory       rate, EKG, adequacy of pulmonary ventilation, and response to care.       Total physician intraservice time was 13 minutes. Recommendation:           - Patient has a contact number available for                             emergencies. The signs and symptoms of potential                            delayed complications were discussed with the                            patient. Return to normal activities tomorrow.                            Written discharge instructions were provided to the                            patient.                           - Resume previous diet.                           - Continue present medications. Continue Protonix                            40 mg daily.                           - No repeat upper endoscopy.                           - Return to GI office in 1 year. Procedure Code(s):        --- Professional ---                           [phone], Esophagogastroduodenoscopy, flexible,                            transoral; diagnostic, including collection of                            specimen(s) by brushing or washing, when performed                            (  separate procedure)                           43450, Dilation of esophagus, by unguided sound or                            bougie, single or multiple passes                           99152, Moderate sedation services provided by the                            same physician or other qualified health care                            professional performing the diagnostic or                            therapeutic service that the sedation supports,                            requiring the presence of an independent trained                            observer to assist in the monitoring of the                            patient's level of consciousness and physiological                            status; initial 15 minutes of intraservice time,                            patient age 17 years or older Diagnosis Code(s):        --- Professional ---                           K22.2, Esophageal obstruction                           K44.9, Diaphragmatic hernia without obstruction or                             gangrene                           R13.10, Dysphagia, unspecified CPT copyright 2016 [employer]. All rights reserved. The codes documented in this report are preliminary and upon coder review may  be revised to meet current compliance requirements. Cristopher Estimable. Oluwaseun Bruyere, MD Norvel Richards, MD 12/14/2016 1:43:31 PM This report has been signed electronically. Number of Addenda: 0

## 2016-12-14 NOTE — Interval H&P Note (Signed)
History and Physical Interval Note:  12/14/2016 1:20 PM  [name] [name]  has presented today for surgery, with the diagnosis of Dysphagia  The various methods of treatment have been discussed with the patient and family. After consideration of risks, benefits and other options for treatment, the patient has consented to  Procedure(s) with comments: ESOPHAGOGASTRODUODENOSCOPY (EGD) (N/A) - 1:00pm MALONEY DILATION (N/A) as a surgical intervention .  The patient's history has been reviewed, patient examined, no change in status, stable for surgery.  I have reviewed the patient's chart and labs.  Questions were answered to the patient's satisfaction.     Robert Rourk  No change. EGD with esophageal dilation as feasible/appropriate per plan.  The risks, benefits, limitations, alternatives and imponderables have been reviewed with the patient. Potential for esophageal dilation, biopsy, etc. have also been reviewed.  Questions have been answered. All parties agreeable.

## 2016-12-17 ENCOUNTER — Encounter (HOSPITAL_COMMUNITY): Payer: Self-pay | Admitting: Internal Medicine

## 2016-12-30 ENCOUNTER — Other Ambulatory Visit: Payer: Self-pay | Admitting: Family Medicine

## 2017-02-12 ENCOUNTER — Telehealth: Payer: Self-pay | Admitting: Internal Medicine

## 2017-02-12 NOTE — Telephone Encounter (Signed)
Communication noted.  

## 2017-02-12 NOTE — Telephone Encounter (Signed)
Patient daughter called to let us know patient passed away

## 2017-02-14 ENCOUNTER — Ambulatory Visit: Payer: Medicare HMO | Admitting: Nurse Practitioner

## 2017-03-01 DEATH — deceased

## 2017-04-03 ENCOUNTER — Ambulatory Visit: Payer: Medicare HMO | Admitting: "Endocrinology

## 2017-07-10 IMAGING — MR MR CERVICAL SPINE W/O CM
4 of 5 series · 14 of 48 positions shown · non-contrast
Comparison: CT 12/06/2011.

CLINICAL DATA: Neck pain/cervicalgia, worse on the left. Pain
extends to both law arms, worse on the right. Symptoms have worsened
over the last month.

EXAM:
MRI CERVICAL SPINE WITHOUT CONTRAST
TECHNIQUE: Multiplanar, multisequence MR imaging of the cervical spine was
performed. No intravenous contrast was administered.

[Series 3: T2 · sagittal · 3.0mm · 0.39mm/px · 5 of 13 slices shown (1 of 2)]
[im 1/13]
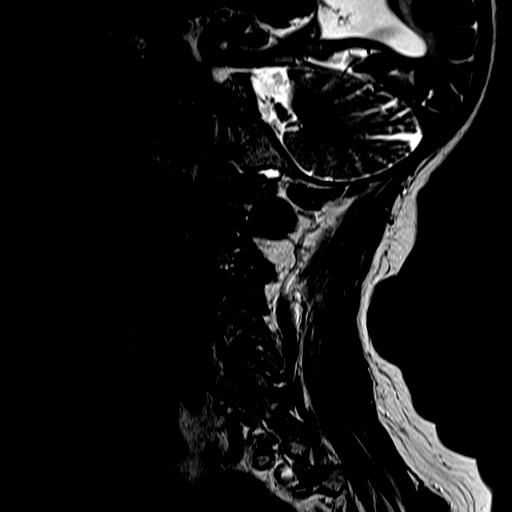
[im 4/13]
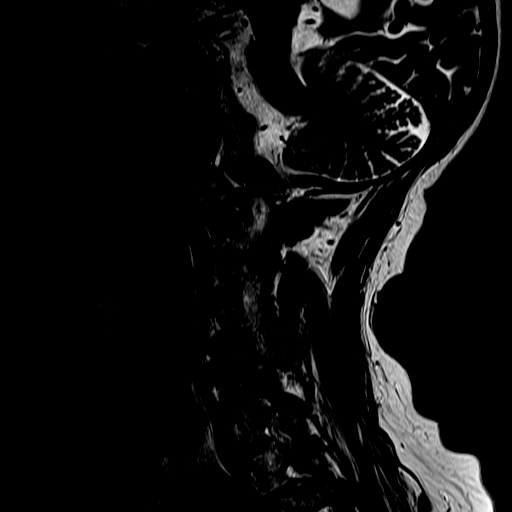
[im 7/13]
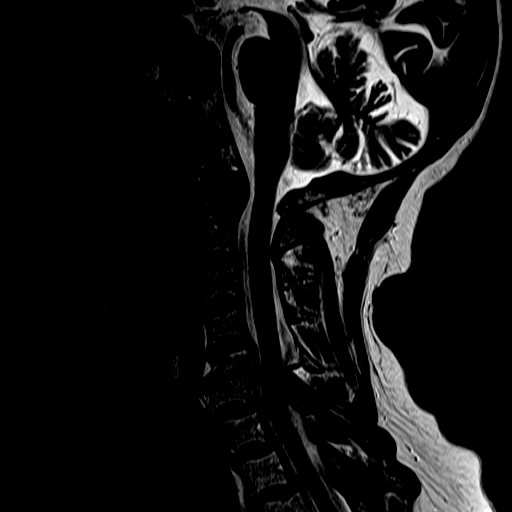
[im 10/13]
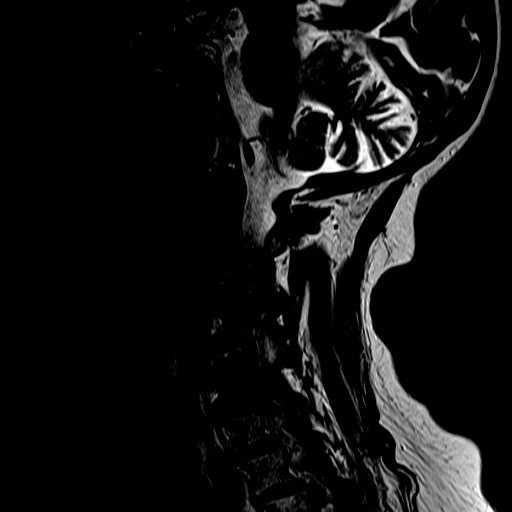
[im 13/13]
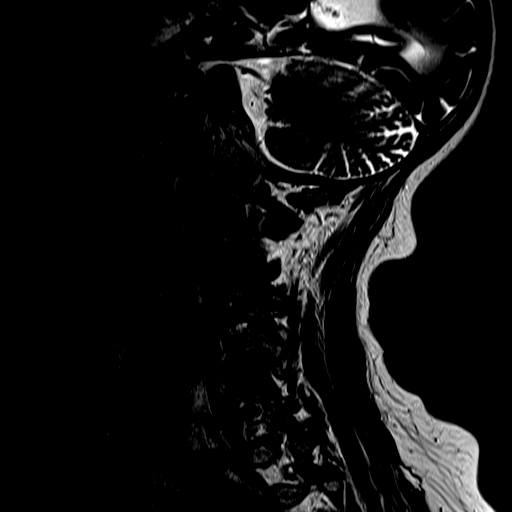

[Series 4: FLAIR · sagittal · 3.0mm · 0.43mm/px · 3 of 13 slices shown]
[im 1/13]
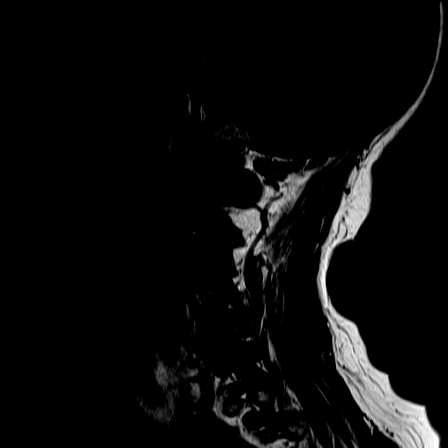
[im 7/13]
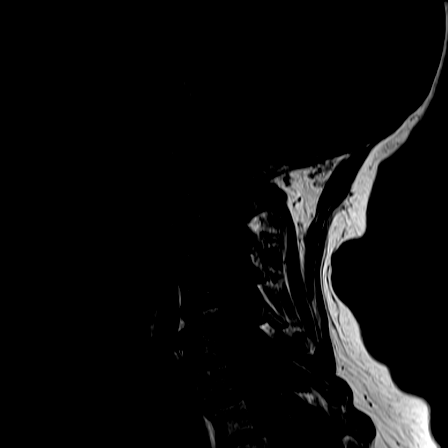
[im 13/13]
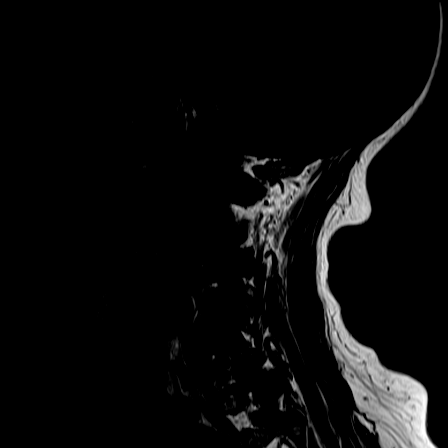

[Series 5: ir sagital · sagittal · 3.0mm · 0.21mm/px · 3 of 13 slices shown]
[im 3/13]
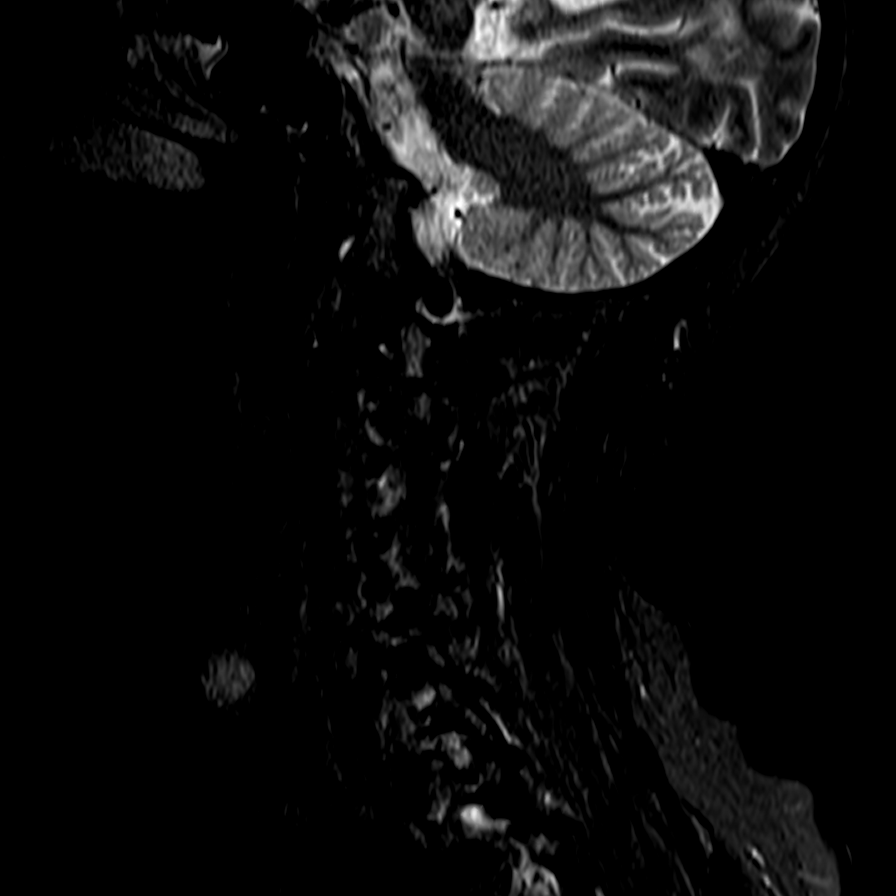
[im 8/13]
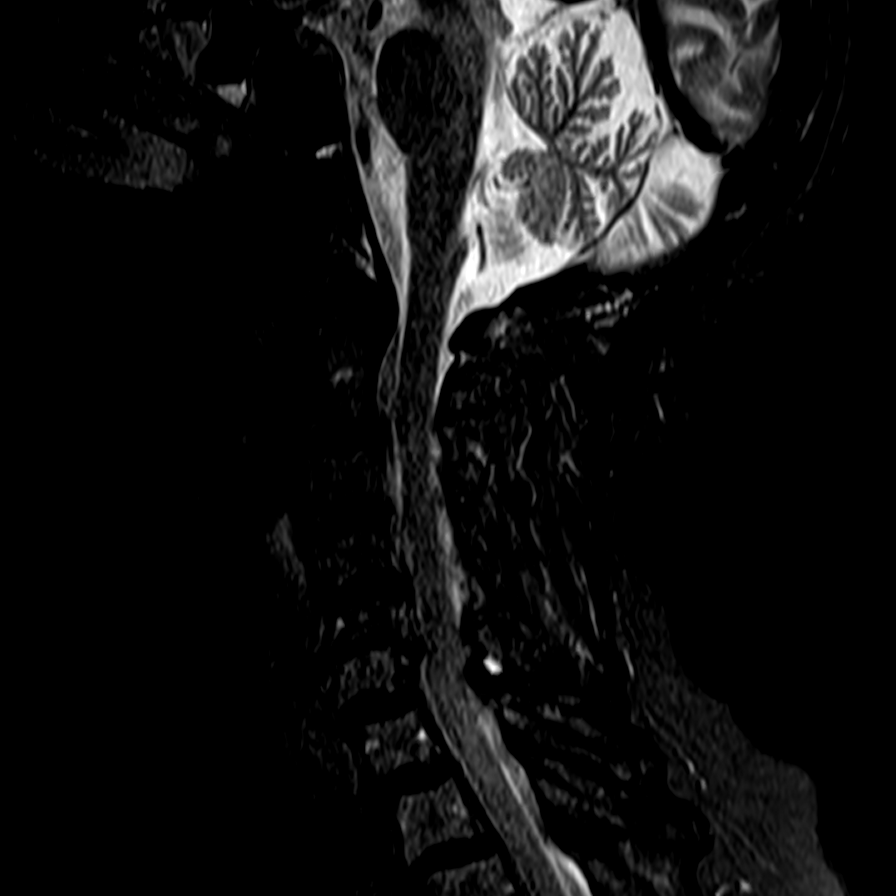
[im 13/13]
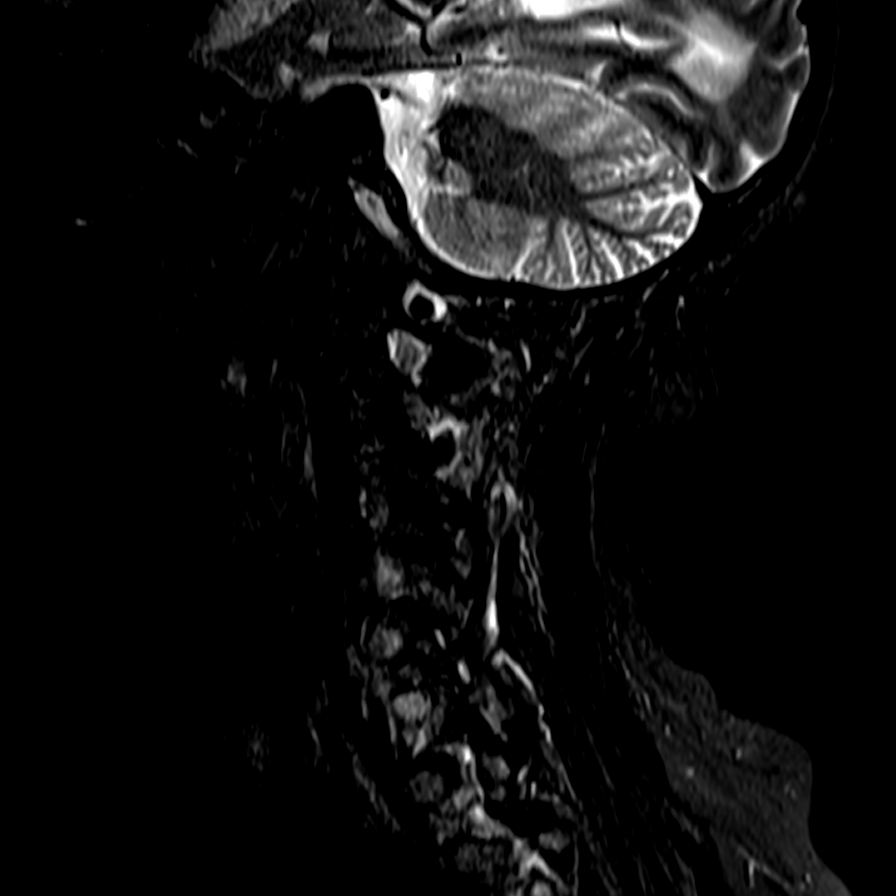

[Series 7: T2 · axial · 3.0mm · 0.21mm/px · z∈[-27,+52]mm · 3 of 36 slices shown (2 of 2)]
[im 5/36]
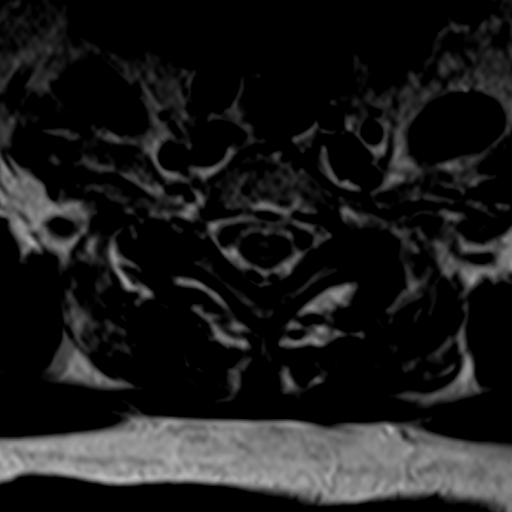
[im 19/36]
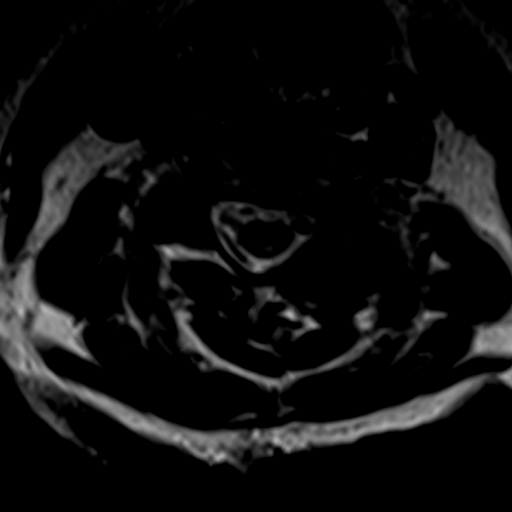
[im 31/36]
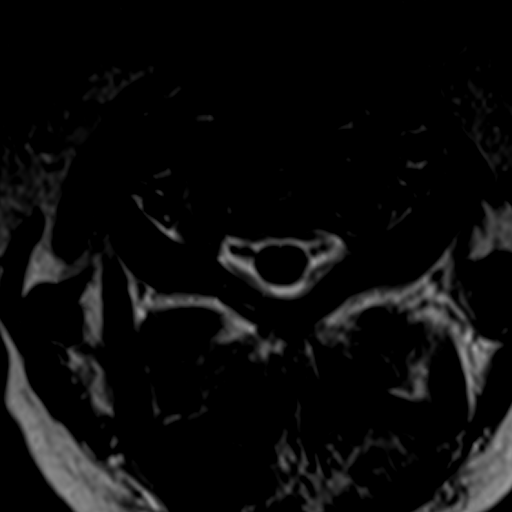

[14 of 48 positions shown; findings below may reference images not displayed]

FINDINGS: The foramen magnum is widely patent. There is osteoarthritis of the
C1-2 articulation without significant encroachment upon the neural
spaces.

C2-3: Advanced bilateral facet arthropathy with anterolisthesis of 2
mm. Mild bulging of the disc. No central canal stenosis. Foraminal
stenosis left worse than right because of osteophytic encroachment.

C3-4: Facet arthropathy bilaterally left worse than right. No
malalignment. No disc herniation. No central canal stenosis.
Moderate foraminal narrowing on the left because of bony
encroachment.

C4-5: Advanced bilateral facet arthropathy with 3 mm of
anterolisthesis. No bulging of the disc. No compressive central
canal stenosis. Mild foraminal stenosis bilaterally.

C5-6: Retrolisthesis of 3 mm. No disc herniation. Effacement of the
subarachnoid space surrounding the cord but no cord compression. AP
diameter of the canal 9.5 mm. Foraminal stenosis bilaterally that
could compress either or both C6 nerve roots.

C6-7: Advanced bilateral facet arthropathy with 4 mm of
anterolisthesis. Bulging of the disc. Narrowing of the subarachnoid
space surrounding the cord but no cord compression. Foraminal
stenosis bilaterally that could affect either or both C7 nerve
roots.

C7-T1: Bilateral facet arthropathy with 2 mm of anterolisthesis.
Bulging of the disc. No central canal stenosis. Mild foraminal
narrowing bilaterally.

T1-2: Bilateral facet arthropathy with 3 mm of anterolisthesis. No
compressive central canal stenosis. Mild foraminal stenosis
bilaterally.
IMPRESSION: Facet arthropathy throughout the cervical spine with anterolisthesis
at C4-5, C6-7, C7-T1 and T1-T2 and with retrolisthesis at C5-6.
There is central canal narrowing at C5-6 and C6-7 but no compression
of the cord. Foraminal stenosis because of osteophytic encroachment
has potential for neural compression bilaterally is C2-3, on the
left at C3-4 and bilaterally at C5-6 and C6-7. Additionally, the
facet arthropathy could be a cause of neck pain throughout.
